# Patient Record
Sex: Female | Born: 1965 | Race: Black or African American | Hispanic: No | Marital: Married | Smoking: Current some day smoker
Health system: Southern US, Community
[De-identification: ages and names within clinical notes are randomized; demographics above are authoritative.]

## PROBLEM LIST (undated history)

## (undated) DIAGNOSIS — F32A Depression, unspecified: Secondary | ICD-10-CM

## (undated) DIAGNOSIS — M1710 Unilateral primary osteoarthritis, unspecified knee: Secondary | ICD-10-CM

## (undated) DIAGNOSIS — F329 Major depressive disorder, single episode, unspecified: Secondary | ICD-10-CM

## (undated) DIAGNOSIS — M1990 Unspecified osteoarthritis, unspecified site: Secondary | ICD-10-CM

## (undated) DIAGNOSIS — B019 Varicella without complication: Secondary | ICD-10-CM

## (undated) DIAGNOSIS — E669 Obesity, unspecified: Secondary | ICD-10-CM

## (undated) DIAGNOSIS — Z87828 Personal history of other (healed) physical injury and trauma: Secondary | ICD-10-CM

## (undated) DIAGNOSIS — E611 Iron deficiency: Secondary | ICD-10-CM

## (undated) DIAGNOSIS — M179 Osteoarthritis of knee, unspecified: Secondary | ICD-10-CM

## (undated) HISTORY — DX: Unspecified osteoarthritis, unspecified site: M19.90

## (undated) HISTORY — DX: Depression, unspecified: F32.A

## (undated) HISTORY — DX: Major depressive disorder, single episode, unspecified: F32.9

## (undated) HISTORY — PX: COLONOSCOPY: SHX174

## (undated) HISTORY — PX: ABDOMINAL HYSTERECTOMY: SHX81

## (undated) HISTORY — DX: Varicella without complication: B01.9

---

## 2010-01-30 ENCOUNTER — Ambulatory Visit: Payer: Self-pay | Admitting: Gynecology

## 2010-01-30 ENCOUNTER — Other Ambulatory Visit: Admission: RE | Admit: 2010-01-30 | Discharge: 2010-01-30 | Payer: Self-pay | Admitting: Gynecology

## 2010-02-04 ENCOUNTER — Ambulatory Visit: Payer: Self-pay | Admitting: Gynecology

## 2010-02-16 ENCOUNTER — Ambulatory Visit: Payer: Self-pay | Admitting: Gynecology

## 2010-08-14 ENCOUNTER — Ambulatory Visit: Admit: 2010-08-14 | Payer: Self-pay | Admitting: Family Medicine

## 2010-08-18 ENCOUNTER — Ambulatory Visit: Admit: 2010-08-18 | Payer: Self-pay | Admitting: Family Medicine

## 2011-03-04 ENCOUNTER — Other Ambulatory Visit: Payer: Self-pay | Admitting: Family Medicine

## 2011-03-04 DIAGNOSIS — I8390 Asymptomatic varicose veins of unspecified lower extremity: Secondary | ICD-10-CM

## 2011-03-08 ENCOUNTER — Ambulatory Visit
Admission: RE | Admit: 2011-03-08 | Discharge: 2011-03-08 | Disposition: A | Discharge status: Home / Self Care | Payer: Managed Care, Other (non HMO) | Source: Ambulatory Visit | Attending: Family Medicine | Admitting: Family Medicine

## 2011-03-08 DIAGNOSIS — I8390 Asymptomatic varicose veins of unspecified lower extremity: Secondary | ICD-10-CM

## 2011-06-10 ENCOUNTER — Other Ambulatory Visit: Payer: Self-pay | Admitting: Obstetrics and Gynecology

## 2011-06-10 DIAGNOSIS — N644 Mastodynia: Secondary | ICD-10-CM

## 2011-06-10 DIAGNOSIS — N6452 Nipple discharge: Secondary | ICD-10-CM

## 2011-07-06 ENCOUNTER — Ambulatory Visit: Payer: Managed Care, Other (non HMO)

## 2012-11-20 ENCOUNTER — Encounter (HOSPITAL_COMMUNITY): Payer: Self-pay | Admitting: *Deleted

## 2012-11-20 ENCOUNTER — Emergency Department (HOSPITAL_COMMUNITY): Payer: Managed Care, Other (non HMO)

## 2012-11-20 ENCOUNTER — Other Ambulatory Visit: Payer: Self-pay

## 2012-11-20 ENCOUNTER — Emergency Department (HOSPITAL_COMMUNITY)
Admission: EM | Admit: 2012-11-20 | Discharge: 2012-11-20 | Disposition: A | Discharge status: Home / Self Care | Payer: Managed Care, Other (non HMO) | Attending: Emergency Medicine | Admitting: Emergency Medicine

## 2012-11-20 DIAGNOSIS — R071 Chest pain on breathing: Secondary | ICD-10-CM | POA: Insufficient documentation

## 2012-11-20 DIAGNOSIS — J329 Chronic sinusitis, unspecified: Secondary | ICD-10-CM | POA: Insufficient documentation

## 2012-11-20 DIAGNOSIS — R61 Generalized hyperhidrosis: Secondary | ICD-10-CM | POA: Insufficient documentation

## 2012-11-20 DIAGNOSIS — J3489 Other specified disorders of nose and nasal sinuses: Secondary | ICD-10-CM | POA: Insufficient documentation

## 2012-11-20 DIAGNOSIS — Z880 Allergy status to penicillin: Secondary | ICD-10-CM | POA: Insufficient documentation

## 2012-11-20 DIAGNOSIS — R059 Cough, unspecified: Secondary | ICD-10-CM | POA: Insufficient documentation

## 2012-11-20 DIAGNOSIS — R05 Cough: Secondary | ICD-10-CM | POA: Insufficient documentation

## 2012-11-20 DIAGNOSIS — Z87891 Personal history of nicotine dependence: Secondary | ICD-10-CM | POA: Insufficient documentation

## 2012-11-20 DIAGNOSIS — R0789 Other chest pain: Secondary | ICD-10-CM

## 2012-11-20 LAB — BASIC METABOLIC PANEL
BUN: 17 mg/dL (ref 6–23)
Chloride: 105 mEq/L (ref 96–112)
Creatinine, Ser: 0.96 mg/dL (ref 0.50–1.10)
GFR calc Af Amer: 80 mL/min — ABNORMAL LOW (ref >90)
GFR calc non Af Amer: 69 mL/min — ABNORMAL LOW (ref >90)

## 2012-11-20 LAB — CBC
MCHC: 33.6 g/dL (ref 30.0–36.0)
MCV: 82.4 fL (ref 78.0–100.0)
Platelets: 384 10*3/uL (ref 150–400)
RDW: 14 % (ref 11.5–15.5)
WBC: 9.9 10*3/uL (ref 4.0–10.5)

## 2012-11-20 LAB — POCT I-STAT TROPONIN I: Troponin i, poc: 0 ng/mL (ref 0.00–0.08)

## 2012-11-20 MED ORDER — BENZONATATE 100 MG PO CAPS: 100.0000 mg | ORAL_CAPSULE | Freq: Three times a day (TID) | ORAL | Status: DC

## 2012-11-20 MED ORDER — OXYMETAZOLINE HCL 0.05 % NA SOLN: 2.0000 | Freq: Two times a day (BID) | NASAL | Status: DC

## 2012-11-20 MED ORDER — AZITHROMYCIN 250 MG PO TABS: ORAL_TABLET | ORAL | Status: DC

## 2012-11-20 MED ORDER — IBUPROFEN 600 MG PO TABS: 600.0000 mg | ORAL_TABLET | Freq: Four times a day (QID) | ORAL | Status: DC | PRN

## 2012-11-20 MED ORDER — MOMETASONE FUROATE 50 MCG/ACT NA SUSP: 2.0000 | Freq: Every day | NASAL | Status: DC

## 2012-11-20 NOTE — ED Notes (Signed)
Pt states sneezing on Friday and then started having tightness in chest that increases with coughing.  Pt in no respiratory distress.  Pt states cough with productive sputum last week.

## 2012-11-20 NOTE — ED Provider Notes (Signed)
History     CSN: [id number]  Arrival date & time 11/20/12  0700   First MD Initiated Contact with Patient 11/20/12 [phone]      Chief Complaint  Patient presents with  . Chest Pain    (Consider location/radiation/quality/duration/timing/severity/associated sxs/prior treatment) HPI Pt p/w several weeks of sinus congestion and cough. NO fever or chills. She began having parasternal chest wall tenderness 3 days ago, worse with palpation and deep breathing. C/o diffuse swelling. No wheezing.  History reviewed. No pertinent past medical history.  Past Surgical History  Procedure Laterality Date  . Abdominal hysterectomy      partial    No family history on file.  History  Substance Use Topics  . Smoking status: Former Games developer  . Smokeless tobacco: Not on file  . Alcohol Use: Not on file     Comment: occ    OB History   Grav Para Term Preterm Abortions TAB SAB Ect Mult Living                  Review of Systems  Constitutional: Positive for diaphoresis. Negative for fever and chills.  HENT: Positive for congestion, rhinorrhea and sinus pressure. Negative for sore throat and neck stiffness.   Respiratory: Positive for cough. Negative for shortness of breath and wheezing.   Cardiovascular: Positive for chest pain. Negative for palpitations and leg swelling.  Gastrointestinal: Negative for nausea, vomiting and abdominal pain.  Musculoskeletal: Negative for myalgias and back pain.  Skin: Negative for rash and wound.  Neurological: Negative for dizziness, weakness, light-headedness and numbness.  All other systems reviewed and are negative.    Allergies  Penicillins  Home Medications   Current Outpatient Rx  Name  Route  Sig  Dispense  Refill  . acetaminophen (TYLENOL) 500 MG tablet   Oral   Take 1,000 mg by mouth every 6 (six) hours as needed for pain.         Marland Kitchen HYDROcodone-acetaminophen (NORCO/VICODIN) 5-325 MG per tablet   Oral   Take 1 tablet by mouth every 6  (six) hours as needed for pain.         Marland Kitchen azithromycin (ZITHROMAX Z-PAK) 250 MG tablet      2 po day one, then 1 daily x 4 days   5 tablet   0   . benzonatate (TESSALON) 100 MG capsule   Oral   Take 1 capsule (100 mg total) by mouth every 8 (eight) hours.   21 capsule   0   . ibuprofen (ADVIL,MOTRIN) 600 MG tablet   Oral   Take 1 tablet (600 mg total) by mouth every 6 (six) hours as needed for pain.   30 tablet   0   . mometasone (NASONEX) 50 MCG/ACT nasal spray   Nasal   Place 2 sprays into the nose daily.   17 g   12   . oxymetazoline (AFRIN NASAL SPRAY) 0.05 % nasal spray   Nasal   Place 2 sprays into the nose 2 (two) times daily.   30 mL   0     BP 106/69  Pulse 64  Temp(Src) 98.4 F (36.9 C) (Oral)  Resp 20  SpO2 100%  Physical Exam  Nursing note and vitals reviewed. Constitutional: She is oriented to person, place, and time. She appears well-developed and well-nourished. No distress.  HENT:  Head: Normocephalic and atraumatic.  Mouth/Throat: Oropharynx is clear and moist.  Left frontal sinus TTP. +nasal congestion  Eyes: EOM are normal.  Pupils are equal, round, and reactive to light.  Neck: Normal range of motion. Neck supple.  Cardiovascular: Normal rate and regular rhythm.   Pulmonary/Chest: Effort normal and breath sounds normal. No respiratory distress. She has no wheezes. She has no rales. She exhibits tenderness (reproduced chest wall pain with palp over costal cartilage).  Delayed exp phase  Abdominal: Soft. Bowel sounds are normal. She exhibits no mass. There is no tenderness. There is no rebound and no guarding.  Musculoskeletal: Normal range of motion. She exhibits no edema and no tenderness.  No calf swelling or tenderness  Neurological: She is alert and oriented to person, place, and time.  Skin: Skin is warm and dry. No rash noted. No erythema.  Psychiatric: She has a normal mood and affect. Her behavior is normal.    ED Course   Procedures (including critical care time)  Labs Reviewed  CBC - Abnormal; Notable for the following:    Hemoglobin 11.8 (*)    HCT 35.1 (*)    All other components within normal limits  BASIC METABOLIC PANEL - Abnormal; Notable for the following:    Glucose, Bld 115 (*)    GFR calc non Af Amer 69 (*)    GFR calc Af Amer 80 (*)    All other components within normal limits   Dg Chest 2 View  11/20/2012  *RADIOLOGY REPORT*  Clinical Data: Chest pain and cough.  CHEST - 2 VIEW  Comparison: None.  Findings: Two views of the chest were obtained.  The lungs are clear without airspace disease or pulmonary edema. Heart and mediastinum are within normal limits.  The trachea is midline.  The bony thorax is intact.  IMPRESSION: No acute cardiopulmonary disease.   Original Report Authenticated By: Richarda Overlie, M.D.      1. Sinusitis   2. Chest wall pain       Date: 11/20/2012  Rate: 69  Rhythm: normal sinus rhythm  QRS Axis: normal  Intervals: normal  ST/T Wave abnormalities: nonspecific T wave changes  Conduction Disutrbances:none  Narrative Interpretation:   Old EKG Reviewed: none available   MDM  Suspect bronchitis with chest wall strain vs costochondritis. Doubt ACS or PE.         Loren Racer, MD 11/20/12 [phone]

## 2013-06-26 ENCOUNTER — Emergency Department (HOSPITAL_BASED_OUTPATIENT_CLINIC_OR_DEPARTMENT_OTHER)
Admission: EM | Admit: 2013-06-26 | Discharge: 2013-06-26 | Disposition: A | Discharge status: Home / Self Care | Payer: Managed Care, Other (non HMO) | Attending: Emergency Medicine | Admitting: Emergency Medicine

## 2013-06-26 ENCOUNTER — Encounter (HOSPITAL_BASED_OUTPATIENT_CLINIC_OR_DEPARTMENT_OTHER): Payer: Self-pay | Admitting: Emergency Medicine

## 2013-06-26 ENCOUNTER — Emergency Department (HOSPITAL_BASED_OUTPATIENT_CLINIC_OR_DEPARTMENT_OTHER): Payer: Managed Care, Other (non HMO)

## 2013-06-26 DIAGNOSIS — IMO0002 Reserved for concepts with insufficient information to code with codable children: Secondary | ICD-10-CM | POA: Insufficient documentation

## 2013-06-26 DIAGNOSIS — M169 Osteoarthritis of hip, unspecified: Secondary | ICD-10-CM

## 2013-06-26 DIAGNOSIS — M545 Low back pain, unspecified: Secondary | ICD-10-CM | POA: Insufficient documentation

## 2013-06-26 DIAGNOSIS — F17200 Nicotine dependence, unspecified, uncomplicated: Secondary | ICD-10-CM | POA: Insufficient documentation

## 2013-06-26 DIAGNOSIS — Z792 Long term (current) use of antibiotics: Secondary | ICD-10-CM | POA: Insufficient documentation

## 2013-06-26 DIAGNOSIS — M1610 Unilateral primary osteoarthritis, unspecified hip: Secondary | ICD-10-CM | POA: Insufficient documentation

## 2013-06-26 DIAGNOSIS — Z880 Allergy status to penicillin: Secondary | ICD-10-CM | POA: Insufficient documentation

## 2013-06-26 DIAGNOSIS — Z79899 Other long term (current) drug therapy: Secondary | ICD-10-CM | POA: Insufficient documentation

## 2013-06-26 LAB — URINALYSIS, ROUTINE W REFLEX MICROSCOPIC
Bilirubin Urine: NEGATIVE
Glucose, UA: NEGATIVE mg/dL
Ketones, ur: NEGATIVE mg/dL
Protein, ur: NEGATIVE mg/dL
Urobilinogen, UA: 0.2 mg/dL (ref 0.0–1.0)

## 2013-06-26 LAB — URINE MICROSCOPIC-ADD ON

## 2013-06-26 MED ORDER — IBUPROFEN 800 MG PO TABS: 800.0000 mg | ORAL_TABLET | Freq: Three times a day (TID) | ORAL | Status: DC

## 2013-06-26 MED ORDER — IBUPROFEN 800 MG PO TABS
800.0000 mg | ORAL_TABLET | Freq: Once | ORAL | Status: AC
  Administered 2013-06-26: 800 mg via ORAL
  Filled 2013-06-26: qty 1

## 2013-06-26 NOTE — ED Notes (Signed)
Patient transported to X-ray 

## 2013-06-26 NOTE — ED Notes (Signed)
MD at bedside giving test results and plan of care for DC. 

## 2013-06-26 NOTE — ED Notes (Signed)
Reports pain and swelling to left flank x 2 weeks- had been using OTC meds with some relief but no longer working. Denies known injury

## 2013-06-26 NOTE — ED Provider Notes (Signed)
CSN: [id number]     Arrival date & time 06/26/13  0051 History   First MD Initiated Contact with Patient 06/26/13 0106     Chief Complaint  Patient presents with  . Flank Pain   (Consider location/radiation/quality/duration/timing/severity/associated sxs/prior Treatment) HPI 47 year old female who comes in today complaining of some left low back pain that radiates to her buttocks that has been present intermittently for a week. She states it is worse when she is up and walking around. She has been taking ibuprofen with relief. She went to work tonight did not have pain but then began having pain. She did not have any medication at work to treat it and came here subsequently for evaluation. She has not had any known trauma to the area had any weakness or numbness of her leg. She has pain with movement of the hip and especially with putting weight on that area. She has no history of immune disorder. History reviewed. No pertinent past medical history. Past Surgical History  Procedure Laterality Date  . Abdominal hysterectomy      partial   No family history on file. History  Substance Use Topics  . Smoking status: Current Every Day Smoker    Types: Cigarettes  . Smokeless tobacco: Never Used     Comment: electronic  . Alcohol Use: Yes     Comment: RARE   OB History   Grav Para Term Preterm Abortions TAB SAB Ect Mult Living                 Review of Systems  All other systems reviewed and are negative.    Allergies  Penicillins  Home Medications   Current Outpatient Rx  Name  Route  Sig  Dispense  Refill  . ibuprofen (ADVIL,MOTRIN) 600 MG tablet   Oral   Take 1 tablet (600 mg total) by mouth every 6 (six) hours as needed for pain.   30 tablet   0   . mometasone (NASONEX) 50 MCG/ACT nasal spray   Nasal   Place 2 sprays into the nose daily.   17 g   12   . acetaminophen (TYLENOL) 500 MG tablet   Oral   Take 1,000 mg by mouth every 6 (six) hours as needed for  pain.         Marland Kitchen azithromycin (ZITHROMAX Z-PAK) 250 MG tablet      2 po day one, then 1 daily x 4 days   5 tablet   0   . benzonatate (TESSALON) 100 MG capsule   Oral   Take 1 capsule (100 mg total) by mouth every 8 (eight) hours.   21 capsule   0   . HYDROcodone-acetaminophen (NORCO/VICODIN) 5-325 MG per tablet   Oral   Take 1 tablet by mouth every 6 (six) hours as needed for pain.         Marland Kitchen oxymetazoline (AFRIN NASAL SPRAY) 0.05 % nasal spray   Nasal   Place 2 sprays into the nose 2 (two) times daily.   30 mL   0    BP 122/80  Pulse 70  Temp(Src) 98.6 F (37 C) (Oral)  Resp 20  Ht 5\' 6"  (1.676 m)  Wt 208 lb (94.348 kg)  BMI 33.59 kg/m2  SpO2 100% Physical Exam  Nursing note and vitals reviewed. Constitutional: She is oriented to person, place, and time. She appears well-developed and well-nourished.  HENT:  Head: Normocephalic and atraumatic.  Right Ear: External ear normal.  Left  Ear: External ear normal.  Nose: Nose normal.  Mouth/Throat: Oropharynx is clear and moist.  Eyes: Conjunctivae and EOM are normal. Pupils are equal, round, and reactive to light.  Neck: Normal range of motion. Neck supple.  Cardiovascular: Normal rate, regular rhythm, normal heart sounds and intact distal pulses.   Pulmonary/Chest: Effort normal and breath sounds normal.  Abdominal: Soft. Bowel sounds are normal.  Musculoskeletal: Normal range of motion.  Some tenderness to palpation laterally over her left hip. Full active range of motion of hip, knee, ankle, and lumbar spine.  Neurological: She is alert and oriented to person, place, and time. She has normal reflexes.  Skin: Skin is warm and dry.  Psychiatric: She has a normal mood and affect. Her behavior is normal. Thought content normal.    ED Course  Procedures (including critical care time) Labs Review Labs Reviewed  URINALYSIS, ROUTINE W REFLEX MICROSCOPIC - Abnormal; Notable for the following:    Hgb urine dipstick  MODERATE (*)    All other components within normal limits  URINE MICROSCOPIC-ADD ON   Imaging Review Dg Hip Complete Left  06/26/2013   CLINICAL DATA:  Left hip pain at iliac crest.  EXAM: LEFT HIP - COMPLETE 2+ VIEW  COMPARISON:  None available for comparison at time of study interpretation.  FINDINGS: There is no evidence of hip fracture or dislocation. Minimal superolateral acetabular spurring. There is no evidence of arthropathy or other focal bone abnormality. Phleboliths and dropped clip in the right pelvis.  IMPRESSION: No acute fracture deformity nor dislocation. Early left hip osteoarthrosis.   Electronically Signed   By: Awilda Metro   On: 06/26/2013 02:04    EKG Interpretation   None       MDM  Patient with left low back, buttock, and hip pain. She has DJD on her left hip x-Cecilio Ohlrich which would be consistent with the symptoms she is having. She does not have any evidence of having any acute infection of the joint with no history of diabetes, no redness, no fever, and no severe tenderness to palpation at the hip joint. The joint is able to be moved actively. She is advised if she has any symptoms consistent with this she should re\re term for reevaluation. She is placed on nonsteroidals. She is advised to followup with her primary care physician and is given a note for tonight.    Hilario Quarry, MD 06/26/13 [phone]

## 2013-06-28 DIAGNOSIS — Z90710 Acquired absence of both cervix and uterus: Secondary | ICD-10-CM | POA: Insufficient documentation

## 2013-06-28 DIAGNOSIS — Z3202 Encounter for pregnancy test, result negative: Secondary | ICD-10-CM | POA: Insufficient documentation

## 2013-06-28 DIAGNOSIS — F17200 Nicotine dependence, unspecified, uncomplicated: Secondary | ICD-10-CM | POA: Insufficient documentation

## 2013-06-28 DIAGNOSIS — Z791 Long term (current) use of non-steroidal anti-inflammatories (NSAID): Secondary | ICD-10-CM | POA: Insufficient documentation

## 2013-06-28 DIAGNOSIS — M1990 Unspecified osteoarthritis, unspecified site: Secondary | ICD-10-CM | POA: Insufficient documentation

## 2013-06-28 DIAGNOSIS — Z880 Allergy status to penicillin: Secondary | ICD-10-CM | POA: Insufficient documentation

## 2013-06-28 NOTE — ED Notes (Signed)
Pt. reports left lower flank pain for 2 weeks denies injury , no dysuria or hematuria , pt. stated she was diagnosed with DJD last Dec. 2 at [facility] prescribed with Ibuprofen with no relief.

## 2013-06-29 ENCOUNTER — Emergency Department (HOSPITAL_COMMUNITY)
Admission: EM | Admit: 2013-06-29 | Discharge: 2013-06-29 | Disposition: A | Discharge status: Home / Self Care | Payer: Managed Care, Other (non HMO) | Attending: Emergency Medicine | Admitting: Emergency Medicine

## 2013-06-29 ENCOUNTER — Encounter (HOSPITAL_COMMUNITY): Payer: Self-pay | Admitting: Emergency Medicine

## 2013-06-29 ENCOUNTER — Emergency Department (HOSPITAL_COMMUNITY): Payer: Managed Care, Other (non HMO)

## 2013-06-29 DIAGNOSIS — IMO0002 Reserved for concepts with insufficient information to code with codable children: Secondary | ICD-10-CM

## 2013-06-29 LAB — URINALYSIS, ROUTINE W REFLEX MICROSCOPIC
Glucose, UA: NEGATIVE mg/dL
Ketones, ur: NEGATIVE mg/dL
Leukocytes, UA: NEGATIVE
Nitrite: NEGATIVE
Specific Gravity, Urine: 1.007 (ref 1.005–1.030)
pH: 5.5 (ref 5.0–8.0)

## 2013-06-29 LAB — URINE MICROSCOPIC-ADD ON

## 2013-06-29 LAB — POCT PREGNANCY, URINE: Preg Test, Ur: NEGATIVE

## 2013-06-29 MED ORDER — METHOCARBAMOL 750 MG PO TABS: 750.0000 mg | ORAL_TABLET | Freq: Four times a day (QID) | ORAL | Status: DC

## 2013-06-29 MED ORDER — HYDROMORPHONE HCL PF 1 MG/ML IJ SOLN
1.0000 mg | Freq: Once | INTRAMUSCULAR | Status: AC
  Administered 2013-06-29: 1 mg via INTRAMUSCULAR
  Filled 2013-06-29: qty 1

## 2013-06-29 MED ORDER — OXYCODONE-ACETAMINOPHEN 5-325 MG PO TABS: 2.0000 | ORAL_TABLET | ORAL | Status: DC | PRN

## 2013-06-29 MED ORDER — PREDNISONE 10 MG PO TABS: 20.0000 mg | ORAL_TABLET | Freq: Every day | ORAL | Status: DC

## 2013-06-29 NOTE — ED Notes (Signed)
Pt st's she has been having pain in left flank x's 2 weeks.  Pt denies any injury.  Pt alert and oriented x's 3, skin warm and dry color appropriate.

## 2013-06-29 NOTE — ED Notes (Signed)
Pt to CT at this time.

## 2013-06-29 NOTE — ED Provider Notes (Signed)
CSN: [id number]     Arrival date & time 06/28/13  2331 History   First MD Initiated Contact with Patient 06/29/13 0021     Chief Complaint  Patient presents with  . Flank Pain   (Consider location/radiation/quality/duration/timing/severity/associated sxs/prior Treatment) Patient is a 47 y.o. female presenting with flank pain. The history is provided by the patient.  Flank Pain   patient here with 2 weeks of intermittent left-sided flank pain. Seen at facility recently diagnosed with left upper arthritis. She noted that there was some blood in her urine at that time. She denies any dysuria. No fever or chills. No rashes noted. Has been using Motrin without relief. Denies any radiation down her leg. This is not worse with standing.  History reviewed. No pertinent past medical history. Past Surgical History  Procedure Laterality Date  . Abdominal hysterectomy      partial   History reviewed. No pertinent family history. History  Substance Use Topics  . Smoking status: Current Every Day Smoker    Types: Cigarettes  . Smokeless tobacco: Never Used     Comment: electronic  . Alcohol Use: Yes     Comment: RARE   OB History   Grav Para Term Preterm Abortions TAB SAB Ect Mult Living                 Review of Systems  Genitourinary: Positive for flank pain.  All other systems reviewed and are negative.    Allergies  Penicillins  Home Medications   Current Outpatient Rx  Name  Route  Sig  Dispense  Refill  . ibuprofen (ADVIL,MOTRIN) 800 MG tablet   Oral   Take 1 tablet (800 mg total) by mouth 3 (three) times daily.   21 tablet   0    BP 124/81  Pulse 60  Temp(Src) 97.7 F (36.5 C) (Oral)  Resp 16  SpO2 100% Physical Exam  Nursing note and vitals reviewed. Constitutional: She is oriented to person, place, and time. She appears well-developed and well-nourished.  Non-toxic appearance. No distress.  HENT:  Head: Normocephalic and atraumatic.  Eyes: Conjunctivae, EOM  and lids are normal. Pupils are equal, round, and reactive to light.  Neck: Normal range of motion. Neck supple. No tracheal deviation present. No mass present.  Cardiovascular: Normal rate, regular rhythm and normal heart sounds.  Exam reveals no gallop.   No murmur heard. Pulmonary/Chest: Effort normal and breath sounds normal. No stridor. No respiratory distress. She has no decreased breath sounds. She has no wheezes. She has no rhonchi. She has no rales.  Abdominal: Soft. Normal appearance and bowel sounds are normal. She exhibits no distension. There is no tenderness. There is CVA tenderness. There is no rigidity, no rebound and no guarding.  Musculoskeletal: Normal range of motion. She exhibits no edema and no tenderness.  Neurological: She is alert and oriented to person, place, and time. She has normal strength. No cranial nerve deficit or sensory deficit. GCS eye subscore is 4. GCS verbal subscore is 5. GCS motor subscore is 6.  Skin: Skin is warm and dry. No abrasion and no rash noted.  Psychiatric: She has a normal mood and affect. Her speech is normal and behavior is normal.    ED Course  Procedures (including critical care time) Labs Review Labs Reviewed  URINALYSIS, ROUTINE W REFLEX MICROSCOPIC   Imaging Review No results found.  EKG Interpretation   None       MDM  No diagnosis found. Patient  given pain meds here feels better. CT negative for kidney stone. Recurrent symptoms are likely from her DJD at T11, T12 She is due for discharge    Toy Baker, MD 06/29/13 0206

## 2013-07-03 ENCOUNTER — Encounter: Payer: Self-pay | Admitting: Family Medicine

## 2013-07-03 ENCOUNTER — Ambulatory Visit (INDEPENDENT_AMBULATORY_CARE_PROVIDER_SITE_OTHER): Payer: Managed Care, Other (non HMO) | Admitting: Family Medicine

## 2013-07-03 VITALS — BP 130/83 | HR 66 | Ht 65.0 in | Wt 203.0 lb

## 2013-07-03 DIAGNOSIS — IMO0002 Reserved for concepts with insufficient information to code with codable children: Secondary | ICD-10-CM

## 2013-07-03 DIAGNOSIS — M545 Low back pain: Secondary | ICD-10-CM

## 2013-07-03 DIAGNOSIS — M5416 Radiculopathy, lumbar region: Secondary | ICD-10-CM

## 2013-07-03 MED ORDER — PREDNISONE 10 MG PO TABS: ORAL_TABLET | ORAL | Status: DC

## 2013-07-03 MED ORDER — OXYCODONE-ACETAMINOPHEN 7.5-325 MG PO TABS: 1.0000 | ORAL_TABLET | Freq: Four times a day (QID) | ORAL | Status: DC | PRN

## 2013-07-03 MED ORDER — METHOCARBAMOL 750 MG PO TABS: 750.0000 mg | ORAL_TABLET | Freq: Three times a day (TID) | ORAL | Status: DC | PRN

## 2013-07-03 NOTE — Patient Instructions (Signed)
You have lumbar radiculopathy (a pinched nerve in your low back). Take prednisone as directed.  Day after finishing prednisone start aleve 2 tabs twice a day with food for pain and inflammation. Percocet as needed for severe pain (no driving on this medicine). Robaxin as needed for muscle spasms (no driving on this medicine if it makes you sleepy). Stay as active as possible. Physical therapy has been shown to be helpful as well - start this and home exercises. Strengthening of low back muscles, abdominal musculature are key for long term pain relief. If not improving, will consider further imaging (MRI). Follow up with me in 5 weeks. Out of work in the meantime.

## 2013-07-04 ENCOUNTER — Encounter: Payer: Self-pay | Admitting: Family Medicine

## 2013-07-04 DIAGNOSIS — M545 Low back pain, unspecified: Secondary | ICD-10-CM | POA: Insufficient documentation

## 2013-07-04 NOTE — Progress Notes (Signed)
Patient ID: Lauren Boyle, female   DOB: 03/12/1966, 47 y.o.   MRN: [id number]  PCP: Geryl Rankins, MD  Subjective:   HPI: Patient is a 47 y.o. female here for back/hip pain.  Patient reports a little more than 3 weeks ago she got up and felt the left side of her low back down to hip/buttock 'seize up' Has had pain here worse with movement ever since. No bowel/bladder dysfunction. Tried ibuprofen, robaxin, percocet, prednisone. Not much improvement with this. Had CT abd/pelvis negative for kidney stones. 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  Lumbar spine radiographs normal. Advised by ED pain was due to her hip and back.  History reviewed. No pertinent past medical history.  Current Outpatient Prescriptions on File Prior to Visit  Medication Sig Dispense Refill  . ibuprofen (ADVIL,MOTRIN) 800 MG tablet Take 1 tablet (800 mg total) by mouth 3 (three) times daily.  21 tablet  0   No current facility-administered medications on file prior to visit.    Past Surgical History  Procedure Laterality Date  . Abdominal hysterectomy      partial    Allergies  Allergen Reactions  . Penicillins Anaphylaxis and Itching    History   Social History  . Marital Status: Married    Spouse Name: N/A    Number of Children: N/A  . Years of Education: N/A   Occupational History  . Not on file.   Social History Main Topics  . Smoking status: Current Every Day Smoker    Types: Cigarettes  . Smokeless tobacco: Never Used     Comment: electronic  . Alcohol Use: Yes     Comment: RARE  . Drug Use: No  . Sexual Activity: Not on file   Other Topics Concern  . Not on file   Social History Narrative  . No narrative on file    Family History  Problem Relation Age of Onset  . Hyperlipidemia Mother   . Hypertension Mother   . Hypertension Father   . Hyperlipidemia Father   . Diabetes Father   . Heart attack Neg Hx   . Sudden death Neg Hx     BP 130/83  Pulse 66  Ht 5\' 5"   (1.651 m)  Wt 203 lb (92.08 kg)  BMI 33.78 kg/m2  Review of Systems: See HPI above.    Objective:  Physical Exam:  Gen: NAD  Back/L hip: No gross deformity, scoliosis. TTP L>R paraspinal lumbar region.  No midline or bony TTP.  Left buttock, trochanter tenderness also. FROM with pain on flexion and extension. Strength LEs 5/5 all muscle groups.   2+ MSRs in patellar and achilles tendons, equal bilaterally. Positive SLR on left, negative right. Sensation intact to light touch bilaterally. Negative logroll bilateral hips Negative fabers and piriformis stretches.    Assessment & Plan:  1. Lumbar radiculopathy - start longer course of prednisone.  Percocet, robaxin as needed.  Start physical therapy and home exercise program.  If after total of 6 weeks she has not improved would consider going ahead with MRI lumbar spine, possible ESIs depending on that result.

## 2013-07-04 NOTE — Assessment & Plan Note (Signed)
start longer course of prednisone.  Percocet, robaxin as needed.  Start physical therapy and home exercise program.  If after total of 6 weeks she has not improved would consider going ahead with MRI lumbar spine, possible ESIs depending on that result.

## 2013-07-06 ENCOUNTER — Telehealth: Payer: Self-pay | Admitting: Family Medicine

## 2013-07-06 NOTE — Telephone Encounter (Signed)
I suspect it's because of the oxycodone - can cause constipation leading to straining, blood in stool, urinary hesitancy.  Ask her to stop this for a couple days, drink plenty of fluids, pick up a stool softener over the counter (colace or miralax - a laxative) and take as directed.  If she starts to have incontinence (losing control of bowel, bladder function), numbness in genital region, or weakness in both legs, she should call 911 (these are unlikely).  If still having problems with this come Monday we can see her in the office.

## 2013-07-06 NOTE — Telephone Encounter (Signed)
Message copied by Lenda Kelp on Fri Jul 06, 2013 12:03 PM ------      Message from: Gennette Pac D      Created: Fri Jul 06, 2013 11:47 AM      Regarding: FW: questions      Contact: [phone]                   ----- Message -----         From: Chari Manning         Sent: 07/06/2013  11:20 AM           To: Rosaria Ferries, CMA      Subject: questions                                                Not sure if it is from meds but she is having trouble with bowel movements, hard to urinate and has blood in stool.  (would this be for you or Dr. Rexene Edison?)  She is taking Prednisone and Ocycodone, and muscle relaxer. ------

## 2013-07-09 ENCOUNTER — Encounter: Payer: Self-pay | Admitting: Family Medicine

## 2013-07-09 ENCOUNTER — Ambulatory Visit (INDEPENDENT_AMBULATORY_CARE_PROVIDER_SITE_OTHER): Payer: Managed Care, Other (non HMO) | Admitting: Family Medicine

## 2013-07-09 ENCOUNTER — Ambulatory Visit: Payer: Managed Care, Other (non HMO) | Attending: Family Medicine | Admitting: Physical Therapy

## 2013-07-09 VITALS — BP 118/78 | HR 68 | Ht 65.0 in | Wt 203.0 lb

## 2013-07-09 DIAGNOSIS — R3911 Hesitancy of micturition: Secondary | ICD-10-CM

## 2013-07-09 DIAGNOSIS — IMO0002 Reserved for concepts with insufficient information to code with codable children: Secondary | ICD-10-CM

## 2013-07-09 DIAGNOSIS — IMO0001 Reserved for inherently not codable concepts without codable children: Secondary | ICD-10-CM | POA: Insufficient documentation

## 2013-07-09 DIAGNOSIS — M5416 Radiculopathy, lumbar region: Secondary | ICD-10-CM

## 2013-07-09 DIAGNOSIS — M545 Low back pain, unspecified: Secondary | ICD-10-CM | POA: Insufficient documentation

## 2013-07-09 DIAGNOSIS — K921 Melena: Secondary | ICD-10-CM

## 2013-07-09 NOTE — Assessment & Plan Note (Signed)
At this point with her trouble urinating and with stools (though has not lost control of bowels), advised we move forward with MRI of her lumbar spine.  Cauda equina is a possibility though doubtful as she has full strength of legs, no saddle anesthesia, no bowel incontinence.  Advised if no evidence of this would consider referral to GI, urology for further evaluation.  Probable the oxycodone caused constipation and to a lesser extent her urinary problems.  Asked her about PCP - states she does not go to this physician any longer.

## 2013-07-09 NOTE — Progress Notes (Addendum)
Patient ID: Lauren Boyle, female   DOB: [birth year], 47 y.o.   MRN: [id number]  PCP: Geryl Rankins, MD  Subjective:   HPI: Patient is a 47 y.o. female here for back/hip pain.  12/9: Patient reports a little more than 3 weeks ago she got up and felt the left side of her low back down to hip/buttock 'seize up' Has had pain here worse with movement ever since. No bowel/bladder dysfunction. Tried ibuprofen, robaxin, percocet, prednisone. Not much improvement with this. Had CT abd/pelvis negative for kidney stones. 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  Lumbar spine radiographs normal. Advised by ED pain was due to her hip and back.  12/15: Patient returns noting despite stopping oxycodone on Thursday she has continued to have constipation and difficulty urinating (hesitancy). Has had to use a suppository to have a bowel movement. No saddle anesthesia. Still with left side of low back pain radiating to hip and buttock. Longer course of prednisone not helping. Thursday had blood in stool - Sunday as well. Left leg feels weak.  History reviewed. No pertinent past medical history.  Current Outpatient Prescriptions on File Prior to Visit  Medication Sig Dispense Refill  . ibuprofen (ADVIL,MOTRIN) 800 MG tablet Take 1 tablet (800 mg total) by mouth 3 (three) times daily.  21 tablet  0  . methocarbamol (ROBAXIN-750) 750 MG tablet Take 1 tablet (750 mg total) by mouth every 8 (eight) hours as needed for muscle spasms.  90 tablet  0  . oxyCODONE-acetaminophen (PERCOCET) 7.5-325 MG per tablet Take 1 tablet by mouth every 6 (six) hours as needed for pain.  60 tablet  0  . predniSONE (DELTASONE) 10 MG tablet 6 tabs po days 1-2, 5 tabs po days 3-4, 4 tabs po days 5-6, 3 tabs po days 7-8, 2 tabs po days 9-10, 1 tab po days 11-12  42 tablet  0   No current facility-administered medications on file prior to visit.    Past Surgical History  Procedure Laterality Date  . Abdominal hysterectomy       partial    Allergies  Allergen Reactions  . Penicillins Anaphylaxis and Itching    History   Social History  . Marital Status: Married    Spouse Name: N/A    Number of Children: N/A  . Years of Education: N/A   Occupational History  . Not on file.   Social History Main Topics  . Smoking status: Current Every Day Smoker    Types: Cigarettes  . Smokeless tobacco: Never Used     Comment: electronic  . Alcohol Use: Yes     Comment: RARE  . Drug Use: No  . Sexual Activity: Not on file   Other Topics Concern  . Not on file   Social History Narrative  . No narrative on file    Family History  Problem Relation Age of Onset  . Hyperlipidemia Mother   . Hypertension Mother   . Hypertension Father   . Hyperlipidemia Father   . Diabetes Father   . Heart attack Neg Hx   . Sudden death Neg Hx     BP 118/78  Pulse 68  Ht 5\' 5"  (1.651 m)  Wt 203 lb (92.08 kg)  BMI 33.78 kg/m2  Review of Systems: See HPI above.    Objective:  Physical Exam:  Gen: NAD  Back/L hip: No gross deformity, scoliosis. TTP L>R paraspinal lumbar region.  No midline or bony TTP.  Left buttock, trochanter tenderness also. FROM with  pain on flexion and extension. Strength LEs 5/5 all muscle groups.   2+ MSRs in patellar and achilles tendons, equal bilaterally. Positive SLR on left, negative right. Sensation intact to light touch bilaterally. Negative logroll bilateral hips    Assessment & Plan:  1. Lumbar radiculopathy - At this point with her trouble urinating and with stools (though has not lost control of bowels), advised we move forward with MRI of her lumbar spine.  Cauda equina is a possibility though doubtful as she has full strength of legs, no saddle anesthesia, no bowel incontinence.  Advised if no evidence of this would consider referral to GI, urology for further evaluation.  Probable the oxycodone caused constipation and to a lesser extent her urinary problems.  Asked her  about PCP - states she does not go to this physician any longer.    Addendum:  MRI reviewed and discussed with patient.  She does have a small disc bulge at L5-S1 but no nerve compression.  This still could be symptomatic but nothing to account for her bowel/bladder dysfunction.  Continue with PT.  Will refer to GI, urology as noted above.  FMLA paperwork filled out.

## 2013-07-11 NOTE — Addendum Note (Signed)
Addended by: Lenda Kelp on: 07/11/2013 05:04 PM   Modules accepted: Orders

## 2013-07-17 ENCOUNTER — Ambulatory Visit: Payer: Managed Care, Other (non HMO) | Admitting: Physical Therapy

## 2013-07-23 ENCOUNTER — Encounter: Payer: Self-pay | Admitting: Family Medicine

## 2013-07-23 ENCOUNTER — Ambulatory Visit: Payer: Managed Care, Other (non HMO) | Admitting: Family Medicine

## 2013-07-23 ENCOUNTER — Ambulatory Visit: Payer: Managed Care, Other (non HMO) | Admitting: Physical Therapy

## 2013-07-23 ENCOUNTER — Ambulatory Visit (INDEPENDENT_AMBULATORY_CARE_PROVIDER_SITE_OTHER): Payer: Managed Care, Other (non HMO) | Admitting: Family Medicine

## 2013-07-23 VITALS — BP 103/73 | HR 67 | Ht 65.0 in | Wt 203.0 lb

## 2013-07-23 DIAGNOSIS — M5416 Radiculopathy, lumbar region: Secondary | ICD-10-CM

## 2013-07-23 DIAGNOSIS — IMO0002 Reserved for concepts with insufficient information to code with codable children: Secondary | ICD-10-CM

## 2013-07-23 NOTE — Patient Instructions (Signed)
Continue with physical therapy as you have been. Do home exercises on days you do not go there. Make appointment with GI and complete the urology follow-up and testing. Stay as active as possible. No lifting more than 15 pounds.  No stooping, bending, crawling, kneeling.  If no job available must be out of work until follow-up in 1 month. Follow up with me in 1 month.

## 2013-07-24 ENCOUNTER — Ambulatory Visit: Payer: Managed Care, Other (non HMO) | Admitting: Physical Therapy

## 2013-07-24 ENCOUNTER — Encounter: Payer: Self-pay | Admitting: Family Medicine

## 2013-07-24 NOTE — Assessment & Plan Note (Signed)
consistent with lumbar strain.  MRI showed small bulge at L5-S1 but not compressive and no tearing/herniation.  After she completes GI and urology workups advised to restart physical therapy.  Believe she can now go back to work light duty with restrictions.  No lifting more than 15 pounds.  No stooping, bending, crawling, kneeling.  If no job available must be out of work until follow-up in 1 month.

## 2013-07-24 NOTE — Progress Notes (Signed)
Patient ID: Lauren Boyle, female   DOB: [birth year], 47 y.o.   MRN: [id number]  PCP: Geryl Rankins, MD  Subjective:   HPI: Patient is a 47 y.o. female here for back/hip pain.  12/9: Patient reports a little more than 3 weeks ago she got up and felt the left side of her low back down to hip/buttock 'seize up' Has had pain here worse with movement ever since. No bowel/bladder dysfunction. Tried ibuprofen, robaxin, percocet, prednisone. Not much improvement with this. Had CT abd/pelvis negative for kidney stones. 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  Lumbar spine radiographs normal. Advised by ED pain was due to her hip and back.  12/15: Patient returns noting despite stopping oxycodone on Thursday she has continued to have constipation and difficulty urinating (hesitancy). Has had to use a suppository to have a bowel movement. No saddle anesthesia. Still with left side of low back pain radiating to hip and buttock. Longer course of prednisone not helping. Thursday had blood in stool - Sunday as well. Left leg feels weak.  12/29: Patient returns reporting pain has improved since last visit. She has been doing physical therapy but not doing anything active until GI and urology visits completed. Had Urology visit today and additional testing to be done over next couple days. Constipation improved but still has this - only a spot or two of blood occasionally with BMs. Low back not hurting currently. Does have this with prolonged sitting and quick movements. Not taking any medications for pain now.  History reviewed. No pertinent past medical history.  Current Outpatient Prescriptions on File Prior to Visit  Medication Sig Dispense Refill  . ibuprofen (ADVIL,MOTRIN) 800 MG tablet Take 1 tablet (800 mg total) by mouth 3 (three) times daily.  21 tablet  0  . methocarbamol (ROBAXIN-750) 750 MG tablet Take 1 tablet (750 mg total) by mouth every 8 (eight) hours as needed for muscle  spasms.  90 tablet  0  . oxyCODONE-acetaminophen (PERCOCET) 7.5-325 MG per tablet Take 1 tablet by mouth every 6 (six) hours as needed for pain.  60 tablet  0  . predniSONE (DELTASONE) 10 MG tablet 6 tabs po days 1-2, 5 tabs po days 3-4, 4 tabs po days 5-6, 3 tabs po days 7-8, 2 tabs po days 9-10, 1 tab po days 11-12  42 tablet  0   No current facility-administered medications on file prior to visit.    Past Surgical History  Procedure Laterality Date  . Abdominal hysterectomy      partial    Allergies  Allergen Reactions  . Penicillins Anaphylaxis and Itching    History   Social History  . Marital Status: Married    Spouse Name: N/A    Number of Children: N/A  . Years of Education: N/A   Occupational History  . Not on file.   Social History Main Topics  . Smoking status: Current Every Day Smoker    Types: Cigarettes  . Smokeless tobacco: Never Used     Comment: electronic  . Alcohol Use: Yes     Comment: RARE  . Drug Use: No  . Sexual Activity: Not on file   Other Topics Concern  . Not on file   Social History Narrative  . No narrative on file    Family History  Problem Relation Age of Onset  . Hyperlipidemia Mother   . Hypertension Mother   . Hypertension Father   . Hyperlipidemia Father   . Diabetes Father   .  Heart attack Neg Hx   . Sudden death Neg Hx     BP 103/73  Pulse 67  Ht 5\' 5"  (1.651 m)  Wt 203 lb (92.08 kg)  BMI 33.78 kg/m2  Review of Systems: See HPI above.    Objective:  Physical Exam:  Gen: NAD  Back/L hip: No gross deformity, scoliosis. No TTP currently paraspinal lumbar region.  No midline or bony TTP.  FROM with pain on flexion and extension, improved though. Strength LEs 5/5 all muscle groups.   2+ MSRs in patellar and achilles tendons, equal bilaterally. Negative SLR on left, negative right. Sensation intact to light touch bilaterally. Negative logroll bilateral hips    Assessment & Plan:  1. Low back pain -  consistent with lumbar strain.  MRI showed small bulge at L5-S1 but not compressive and no tearing/herniation.  After she completes GI and urology workups advised to restart physical therapy.  Believe she can now go back to work light duty with restrictions.  No lifting more than 15 pounds.  No stooping, bending, crawling, kneeling.  If no job available must be out of work until follow-up in 1 month.

## 2013-08-01 ENCOUNTER — Ambulatory Visit: Payer: Managed Care, Other (non HMO) | Attending: Family Medicine | Admitting: Rehabilitation

## 2013-08-01 DIAGNOSIS — M545 Low back pain, unspecified: Secondary | ICD-10-CM | POA: Insufficient documentation

## 2013-08-01 DIAGNOSIS — IMO0001 Reserved for inherently not codable concepts without codable children: Secondary | ICD-10-CM | POA: Insufficient documentation

## 2013-08-02 ENCOUNTER — Other Ambulatory Visit: Payer: Self-pay | Admitting: Gastroenterology

## 2013-08-02 DIAGNOSIS — R1013 Epigastric pain: Secondary | ICD-10-CM

## 2013-08-02 DIAGNOSIS — R110 Nausea: Secondary | ICD-10-CM

## 2013-08-03 ENCOUNTER — Ambulatory Visit: Payer: Managed Care, Other (non HMO) | Admitting: Physical Therapy

## 2013-08-06 ENCOUNTER — Ambulatory Visit: Payer: Managed Care, Other (non HMO) | Admitting: Physical Therapy

## 2013-08-07 ENCOUNTER — Ambulatory Visit: Payer: Managed Care, Other (non HMO) | Admitting: Family Medicine

## 2013-08-09 ENCOUNTER — Ambulatory Visit: Payer: Managed Care, Other (non HMO) | Admitting: Physical Therapy

## 2013-08-13 ENCOUNTER — Ambulatory Visit: Payer: Managed Care, Other (non HMO) | Admitting: Physical Therapy

## 2013-08-14 ENCOUNTER — Ambulatory Visit (INDEPENDENT_AMBULATORY_CARE_PROVIDER_SITE_OTHER): Payer: Self-pay | Admitting: General Surgery

## 2013-08-16 ENCOUNTER — Ambulatory Visit: Payer: Managed Care, Other (non HMO) | Admitting: Physical Therapy

## 2013-08-17 ENCOUNTER — Encounter (HOSPITAL_COMMUNITY)
Admission: RE | Admit: 2013-08-17 | Discharge: 2013-08-17 | Disposition: A | Discharge status: Home / Self Care | Payer: Managed Care, Other (non HMO) | Source: Ambulatory Visit | Attending: Gastroenterology | Admitting: Gastroenterology

## 2013-08-17 ENCOUNTER — Ambulatory Visit (HOSPITAL_COMMUNITY)
Admission: RE | Admit: 2013-08-17 | Discharge: 2013-08-17 | Disposition: A | Discharge status: Home / Self Care | Payer: Managed Care, Other (non HMO) | Source: Ambulatory Visit | Attending: Gastroenterology | Admitting: Gastroenterology

## 2013-08-17 DIAGNOSIS — R110 Nausea: Secondary | ICD-10-CM

## 2013-08-17 DIAGNOSIS — R112 Nausea with vomiting, unspecified: Secondary | ICD-10-CM | POA: Insufficient documentation

## 2013-08-17 DIAGNOSIS — R1013 Epigastric pain: Secondary | ICD-10-CM | POA: Insufficient documentation

## 2013-08-17 MED ORDER — TECHNETIUM TC 99M MEBROFENIN IV KIT
5.0000 | PACK | Freq: Once | INTRAVENOUS | Status: AC | PRN
  Administered 2013-08-17: 5 via INTRAVENOUS

## 2013-08-20 ENCOUNTER — Ambulatory Visit: Payer: Managed Care, Other (non HMO) | Admitting: Physical Therapy

## 2013-08-23 ENCOUNTER — Encounter: Payer: Self-pay | Admitting: Family Medicine

## 2013-08-23 ENCOUNTER — Ambulatory Visit: Payer: Managed Care, Other (non HMO) | Admitting: Physical Therapy

## 2013-08-23 ENCOUNTER — Ambulatory Visit (INDEPENDENT_AMBULATORY_CARE_PROVIDER_SITE_OTHER): Payer: Managed Care, Other (non HMO) | Admitting: Family Medicine

## 2013-08-23 VITALS — BP 97/65 | HR 76 | Temp 97.5°F | Ht 65.0 in | Wt 203.0 lb

## 2013-08-23 DIAGNOSIS — M545 Low back pain, unspecified: Secondary | ICD-10-CM

## 2013-08-25 ENCOUNTER — Encounter: Payer: Self-pay | Admitting: Family Medicine

## 2013-08-25 NOTE — Assessment & Plan Note (Signed)
consistent with lumbar strain.  MRI showed small bulge at L5-S1 but not compressive and no tearing/herniation.  She does not want to restart physical therapy.  At this point will return patient over next several weeks to full duty by increasing work hours.  Nothing objective to warrant continued light duty/out of work status otherwise.

## 2013-08-25 NOTE — Progress Notes (Signed)
Patient ID: Lauren Boyle, female   DOB: [birth year], 48 y.o.   MRN: [id number]  PCP: Geryl Rankins, MD  Subjective:   HPI: Patient is a 48 y.o. female here for back/hip pain.  12/9: Patient reports a little more than 3 weeks ago she got up and felt the left side of her low back down to hip/buttock 'seize up' Has had pain here worse with movement ever since. No bowel/bladder dysfunction. Tried ibuprofen, robaxin, percocet, prednisone. Not much improvement with this. Had CT abd/pelvis negative for kidney stones. 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  Lumbar spine radiographs normal. Advised by ED pain was due to her hip and back.  12/15: Patient returns noting despite stopping oxycodone on Thursday she has continued to have constipation and difficulty urinating (hesitancy). Has had to use a suppository to have a bowel movement. No saddle anesthesia. Still with left side of low back pain radiating to hip and buttock. Longer course of prednisone not helping. Thursday had blood in stool - Sunday as well. Left leg feels weak.  12/29: Patient returns reporting pain has improved since last visit. She has been doing physical therapy but not doing anything active until GI and urology visits completed. Had Urology visit today and additional testing to be done over next couple days. Constipation improved but still has this - only a spot or two of blood occasionally with BMs. Low back not hurting currently. Does have this with prolonged sitting and quick movements. Not taking any medications for pain now.  1/29: Patient reports she has good and bad days. Taking aleve. Doing HEP and working out at gym. Not interested in restarting physical therapy - states it wasn't helping her that much. Seeing GI but per her reports she's seeing them now for upper GI issues, not for constipation. Has another appointment with urology in 2 weeks.  Nothing felt to be related to her back pain.  History  reviewed. No pertinent past medical history.  Current Outpatient Prescriptions on File Prior to Visit  Medication Sig Dispense Refill  . ibuprofen (ADVIL,MOTRIN) 800 MG tablet Take 1 tablet (800 mg total) by mouth 3 (three) times daily.  21 tablet  0  . methocarbamol (ROBAXIN-750) 750 MG tablet Take 1 tablet (750 mg total) by mouth every 8 (eight) hours as needed for muscle spasms.  90 tablet  0  . oxyCODONE-acetaminophen (PERCOCET) 7.5-325 MG per tablet Take 1 tablet by mouth every 6 (six) hours as needed for pain.  60 tablet  0  . predniSONE (DELTASONE) 10 MG tablet 6 tabs po days 1-2, 5 tabs po days 3-4, 4 tabs po days 5-6, 3 tabs po days 7-8, 2 tabs po days 9-10, 1 tab po days 11-12  42 tablet  0   No current facility-administered medications on file prior to visit.    Past Surgical History  Procedure Laterality Date  . Abdominal hysterectomy      partial    Allergies  Allergen Reactions  . Penicillins Anaphylaxis and Itching    History   Social History  . Marital Status: Married    Spouse Name: N/A    Number of Children: N/A  . Years of Education: N/A   Occupational History  . Not on file.   Social History Main Topics  . Smoking status: Current Every Day Smoker    Types: Cigarettes  . Smokeless tobacco: Never Used     Comment: electronic  . Alcohol Use: Yes     Comment: RARE  .  Drug Use: No  . Sexual Activity: Not on file   Other Topics Concern  . Not on file   Social History Narrative  . No narrative on file    Family History  Problem Relation Age of Onset  . Hyperlipidemia Mother   . Hypertension Mother   . Hypertension Father   . Hyperlipidemia Father   . Diabetes Father   . Heart attack Neg Hx   . Sudden death Neg Hx     BP 97/65  Pulse 76  Temp(Src) 97.5 F (36.4 C) (Oral)  Ht 5\' 5"  (1.651 m)  Wt 203 lb (92.08 kg)  BMI 33.78 kg/m2  Review of Systems: See HPI above.    Objective:  Physical Exam:  Gen: NAD  Back/L hip: No gross  deformity, scoliosis. No TTP currently paraspinal lumbar region.  No midline or bony TTP.  FROM with pain on flexion and extension, improved though. Strength LEs 5/5 all muscle groups.   2+ MSRs in patellar and achilles tendons, equal bilaterally. Negative SLR on left, negative right. Sensation intact to light touch bilaterally. Negative logroll bilateral hips    Assessment & Plan:  1. Low back pain - consistent with lumbar strain.  MRI showed small bulge at L5-S1 but not compressive and no tearing/herniation.  She does not want to restart physical therapy.  At this point will return patient over next several weeks to full duty by increasing work hours.  Nothing objective to warrant continued light duty/out of work status otherwise.

## 2013-09-03 ENCOUNTER — Telehealth: Payer: Self-pay | Admitting: Family Medicine

## 2013-09-03 NOTE — Telephone Encounter (Signed)
Patient called and stated that her work called her Friday and wants to know if she can start her 8 hour shift on 09-09-13 instead of 09-10-13. She needs a letter if that is possible.

## 2013-09-03 NOTE — Telephone Encounter (Signed)
I would recommend sticking with the program we outlined to give a full week between increase in her hours.  Thanks!

## 2013-09-04 ENCOUNTER — Encounter (INDEPENDENT_AMBULATORY_CARE_PROVIDER_SITE_OTHER): Payer: Self-pay

## 2013-09-04 ENCOUNTER — Ambulatory Visit (INDEPENDENT_AMBULATORY_CARE_PROVIDER_SITE_OTHER): Payer: Managed Care, Other (non HMO) | Admitting: General Surgery

## 2013-09-04 ENCOUNTER — Encounter (INDEPENDENT_AMBULATORY_CARE_PROVIDER_SITE_OTHER): Payer: Self-pay | Admitting: General Surgery

## 2013-09-04 VITALS — BP 100/70 | HR 64 | Temp 98.7°F | Resp 14 | Ht 65.0 in | Wt 214.8 lb

## 2013-09-04 DIAGNOSIS — M545 Low back pain, unspecified: Secondary | ICD-10-CM

## 2013-09-04 NOTE — Patient Instructions (Signed)
Return to your orthopeadic doctor

## 2013-09-04 NOTE — Progress Notes (Signed)
Patient ID: Lauren Boyle, female   DOB: 03/29/1966, 48 y.o.   MRN: [id number]  Chief Complaint  Patient presents with  . New Evaluation    eval Abnormal liver/lesions    HPI Lauren MorosStephanie Boyle is a 48 y.o. female.  We are asked to see the patient in consultation by Dr. Iona CoachMcDairmid to evaluate her for a liver lesion. The patient is a 48 year old black female who has been experiencing back pain that radiates around her left rib cage the last few months. The pain has been persistent and unrelenting. Lauren Boyle has also had some trouble swallowing and is scheduled for an endoscopy with Dr. Loreta AveMann on March 20. Lauren Boyle recently had a CT scan that showed no evidence of a liver lesion. It also showed a 1.6 cm benign-appearing adrenal adenoma  HPI  History reviewed. No pertinent past medical history.  Past Surgical History  Procedure Laterality Date  . Abdominal hysterectomy      partial    Family History  Problem Relation Age of Onset  . Hyperlipidemia Mother   . Hypertension Mother   . Hypertension Father   . Hyperlipidemia Father   . Diabetes Father   . Heart attack Neg Hx   . Sudden death Neg Hx   . Cancer Sister     ovarian    Social History History  Substance Use Topics  . Smoking status: Current Every Day Smoker    Types: Cigarettes  . Smokeless tobacco: Current User     Comment: electronic  . Alcohol Use: 1.2 oz/week    2 Glasses of wine per week     Comment: daily    Allergies  Allergen Reactions  . Penicillins Anaphylaxis and Itching    Current Outpatient Prescriptions  Medication Sig Dispense Refill  . ibuprofen (ADVIL,MOTRIN) 800 MG tablet Take 1 tablet (800 mg total) by mouth 3 (three) times daily.  21 tablet  0   No current facility-administered medications for this visit.    Review of Systems Review of Systems  Constitutional: Negative.   HENT: Negative.   Eyes: Negative.   Respiratory: Negative.   Cardiovascular: Negative.   Gastrointestinal:  Negative.   Endocrine: Negative.   Genitourinary: Negative.   Musculoskeletal: Positive for back pain.  Skin: Negative.   Allergic/Immunologic: Negative.   Neurological: Negative.   Hematological: Negative.   Psychiatric/Behavioral: Negative.     Blood pressure 100/70, pulse 64, temperature 98.7 F (37.1 C), temperature source Oral, resp. rate 14, height 5\' 5"  (1.651 m), weight 214 lb 12.8 oz (97.433 kg).  Physical Exam Physical Exam  Constitutional: Lauren Boyle is oriented to person, place, and time. Lauren Boyle appears well-developed and well-nourished.  HENT:  Head: Normocephalic and atraumatic.  Eyes: Conjunctivae and EOM are normal. Pupils are equal, round, and reactive to light.  Neck: Normal range of motion. Neck supple.  Cardiovascular: Normal rate, regular rhythm and normal heart sounds.   Pulmonary/Chest: Effort normal and breath sounds normal.  Lauren Boyle is tender over her left lower rib cage. There is no palpable mass in this location  Abdominal: Soft. Bowel sounds are normal. There is no tenderness.  Musculoskeletal: Normal range of motion.  Lymphadenopathy:    Lauren Boyle has no cervical adenopathy.  Neurological: Lauren Boyle is alert and oriented to person, place, and time.  Skin: Skin is warm and dry.  Psychiatric: Lauren Boyle has a normal mood and affect. Her behavior is normal.    Data Reviewed As above  Assessment    The patient has persistent back pain that  radiates around her left thoracic rib cage. Lauren Boyle is scheduled for a lumbar MRI study.     Plan    At this point there is no identifiable abnormality with her liver. In addition to the MRI of her lumbar spine it should also probably include her thoracic spine given that her pain level is above the umbilicus which would suggest a thoracic source. Lauren Boyle may return to see [place] on a when necessary basis        TOTH III,Rashi Giuliani S 09/04/2013, 9:34 AM

## 2013-09-06 ENCOUNTER — Emergency Department (HOSPITAL_COMMUNITY)
Admission: EM | Admit: 2013-09-06 | Discharge: 2013-09-06 | Discharge status: Against Medical Advice | Payer: Managed Care, Other (non HMO) | Attending: Emergency Medicine | Admitting: Emergency Medicine

## 2013-09-06 ENCOUNTER — Encounter (HOSPITAL_COMMUNITY): Payer: Self-pay | Admitting: Emergency Medicine

## 2013-09-06 ENCOUNTER — Emergency Department (INDEPENDENT_AMBULATORY_CARE_PROVIDER_SITE_OTHER)
Admission: EM | Admit: 2013-09-06 | Discharge: 2013-09-06 | Disposition: A | Discharge status: Home / Self Care | Payer: Managed Care, Other (non HMO) | Source: Home / Self Care | Attending: Emergency Medicine | Admitting: Emergency Medicine

## 2013-09-06 ENCOUNTER — Emergency Department (INDEPENDENT_AMBULATORY_CARE_PROVIDER_SITE_OTHER): Payer: Managed Care, Other (non HMO)

## 2013-09-06 DIAGNOSIS — M940 Chondrocostal junction syndrome [Tietze]: Secondary | ICD-10-CM

## 2013-09-06 DIAGNOSIS — F17200 Nicotine dependence, unspecified, uncomplicated: Secondary | ICD-10-CM | POA: Insufficient documentation

## 2013-09-06 DIAGNOSIS — R079 Chest pain, unspecified: Secondary | ICD-10-CM | POA: Insufficient documentation

## 2013-09-06 MED ORDER — HYDROCODONE-ACETAMINOPHEN 5-325 MG PO TABS
ORAL_TABLET | ORAL | Status: AC
  Filled 2013-09-06: qty 2

## 2013-09-06 MED ORDER — DICLOFENAC SODIUM 75 MG PO TBEC: 75.0000 mg | DELAYED_RELEASE_TABLET | Freq: Two times a day (BID) | ORAL | Status: DC

## 2013-09-06 MED ORDER — METHYLPREDNISOLONE ACETATE 80 MG/ML IJ SUSP
INTRAMUSCULAR | Status: AC
  Filled 2013-09-06: qty 1

## 2013-09-06 MED ORDER — METHYLPREDNISOLONE ACETATE 80 MG/ML IJ SUSP
80.0000 mg | Freq: Once | INTRAMUSCULAR | Status: AC
  Administered 2013-09-06: 80 mg via INTRAMUSCULAR

## 2013-09-06 MED ORDER — HYDROCODONE-ACETAMINOPHEN 5-325 MG PO TABS: ORAL_TABLET | ORAL | Status: DC

## 2013-09-06 MED ORDER — HYDROCODONE-ACETAMINOPHEN 5-325 MG PO TABS
2.0000 | ORAL_TABLET | Freq: Once | ORAL | Status: AC
  Administered 2013-09-06: 2 via ORAL

## 2013-09-06 NOTE — ED Provider Notes (Signed)
Chief Complaint   Chief Complaint  Patient presents with  . Cough    History of Present Illness    Lauren Boyle is a 48 year old female who has had a three-week history of pain in the left, lower rib cage area along the costal margin. It's been worse the past 2 days. There is tenderness to palpation and she thinks it's a little bit swollen. She denies any injury to the area. She has had a cough productive of some white sputum. She is a one pack-a-day smoker. She's had some nausea and vomiting after meals. She denies any fever, chills, nasal congestion, rhinorrhea, or sore throat. She's had no shortness of breath, wheezing, pleuritic chest pain, palpitations, dizziness, syncope, or abdominal pain. She has seen Dr. Loreta AveMann for GI issues. Her last visit she was prescribed a Z-Pak and an acid blocker. She also has been seeing a sports medicine and for lower back pain. She states this has cleared up completely.  Review of Systems    Other than noted above, the patient denies any of the following symptoms. Systemic:  No fever, chills, sweats, or fatigue. ENT:  No nasal congestion, rhinorrhea, or sore throat. Pulmonary:  No cough, wheezing, shortness of breath, sputum production, hemoptysis. Cardiac:  No palpitations, rapid heartbeat, dizziness, presyncope or syncope. GI:  No abdominal pain, heartburn, nausea, or vomiting. Ext:  No leg pain or swelling.  PMFSH    Past medical history, family history, social history, meds, and allergies were reviewed and updated as needed. She is allergic to penicillins.  Physical Exam     Vital signs:  BP 107/67  Pulse 69  Temp(Src) 97.9 F (36.6 C) (Oral)  Resp 12  SpO2 100% Gen:  Alert, oriented, in no distress, skin warm and dry. Eye:  PERRL, lids and conjunctivas normal.  Sclera non-icteric. ENT:  Mucous membranes moist, pharynx clear. Neck:  Supple, no adenopathy or tenderness.  No JVD. Lungs:  Clear to auscultation, no wheezes, rales or  rhonchi.  No respiratory distress. Heart:  Regular rhythm.  No gallops, murmers, clicks or rubs. Chest:  She has chest wall tenderness to palpation along the left costal margin, possibly some swelling was noted in this area. Abdomen:  Soft, nontender, no organomegaly or mass.  Bowel sounds normal.  No pulsatile abdominal mass or bruit. Ext:  No edema.  No calf tenderness and Homann's sign negative.  Pulses full and equal. Skin:  Warm and dry.  No rash.    Radiology     Dg Ribs Unilateral W/chest Left  09/06/2013   CLINICAL DATA:  Left anterior rib pain for 2 weeks, no known injury  EXAM: LEFT RIBS AND CHEST - 3+ VIEW  COMPARISON:  CT ABD/PELV WO CM dated 06/29/2013; DG CHEST 2 VIEW dated 11/20/2012  FINDINGS: Grossly unchanged borderline enlarged cardiac silhouette. Normal mediastinal contours.  Veiling opacities overlying the bilateral lower lungs is favored to represent overlying breast tissue. No focal airspace opacities. No pleural effusion or pneumothorax. No definite evidence of edema.  No acute osseus abnormalities. Specifically, no displaced left-sided rib fractures with special attention paid to the area demarcated by the radiopaque BB.  IMPRESSION: No acute cardiopulmonary disease. Specifically, no definite displaced rib fractures.   Electronically Signed   By: Simonne ComeJohn  Watts M.D.   On: 09/06/2013 16:22   I reviewed the images independently and personally and concur with the radiologist's findings.   Electrocardiogram     Date: 09/06/2013  Rate: 72  Rhythm: normal sinus rhythm  QRS Axis: normal  Intervals: normal  ST/T Wave abnormalities: normal  Conduction Disutrbances:none  Narrative Interpretation: The EKG was read as showing a septal infarct of age undetermined on the basis of a QS wave in leads V1 and V2, and poor R-wave progression. There was otherwise no evidence of ischemia.  Old EKG Reviewed: none available  Course in Urgent Care Center   She was given Norco 5/325 2 by  mouth for pain. She was also given Depo-Medrol 80 mg IM.  Assessment     The encounter diagnosis was Costochondritis.  This appears to be a clear-cut case of musculoskeletal chest pain. There's no evidence that this is due to any cardiac or pulmonary disease.  Plan     1.  Meds:  The following meds were prescribed:   Discharge Medication List as of 09/06/2013  4:36 PM    START taking these medications   Details  diclofenac (VOLTAREN) 75 MG EC tablet Take 1 tablet (75 mg total) by mouth 2 (two) times daily., Starting 09/06/2013, Until Discontinued, Normal    HYDROcodone-acetaminophen (NORCO/VICODIN) 5-325 MG per tablet 1 to 2 tabs every 4 to 6 hours as needed for pain., Print        2.  Patient Education/Counseling:  The patient was given appropriate handouts, self care instructions, and instructed in symptomatic relief.    3.  Follow up:  The patient was told to follow up here if no better in 3 to 4 days, or sooner if becoming worse in any way, and give an an some red flag symptoms such as worsening pain, shortness of breath, dizziness, or passing out which would prompt immediate return. Suggested she followup with her primary care physician in one week.     Reuben Likes, MD 09/06/13 [phone]

## 2013-09-06 NOTE — ED Notes (Signed)
Pt in c/o left rib pain that radiates down to lower back and into chest at times, has been seen multiple times for this and is being followed by her PMD for same, states they are releasing her back to work and her pain has not been managed yet so she decided to come back here, denies chest pain at this time, EKG completed in triage.

## 2013-09-06 NOTE — Discharge Instructions (Signed)

## 2013-09-06 NOTE — ED Notes (Signed)
Ptb reports  l  Rib  Pain    She  Reports  The  Symptoms  Started  About  3  Weeks  Ago   The  Symptoms  Worse  Over  Last  Few  Days   -  Pain is  Worse  When   She  Coughs    Or  Takes  Deep  Breath

## 2013-09-06 NOTE — ED Notes (Signed)
Pt at desk stating that she would like to leave due to wait time, pt encouraged to stay, triage process explained, delay explained, pt states she is going to leave and try urgent care.

## 2013-10-04 ENCOUNTER — Ambulatory Visit: Payer: Managed Care, Other (non HMO) | Admitting: Family Medicine

## 2014-02-01 ENCOUNTER — Encounter (HOSPITAL_COMMUNITY): Payer: Self-pay | Admitting: Emergency Medicine

## 2014-02-01 ENCOUNTER — Emergency Department (HOSPITAL_COMMUNITY)
Admission: EM | Admit: 2014-02-01 | Discharge: 2014-02-01 | Discharge status: Against Medical Advice | Payer: Managed Care, Other (non HMO) | Attending: Emergency Medicine | Admitting: Emergency Medicine

## 2014-02-01 DIAGNOSIS — R1031 Right lower quadrant pain: Secondary | ICD-10-CM | POA: Insufficient documentation

## 2014-02-01 DIAGNOSIS — F17200 Nicotine dependence, unspecified, uncomplicated: Secondary | ICD-10-CM | POA: Insufficient documentation

## 2014-02-01 LAB — COMPREHENSIVE METABOLIC PANEL
ALBUMIN: 3.5 g/dL (ref 3.5–5.2)
ALT: 14 U/L (ref 0–35)
AST: 14 U/L (ref 0–37)
Alkaline Phosphatase: 90 U/L (ref 39–117)
Anion gap: 16 — ABNORMAL HIGH (ref 5–15)
BUN: 12 mg/dL (ref 6–23)
CO2: 22 mEq/L (ref 19–32)
CREATININE: 0.87 mg/dL (ref 0.50–1.10)
Calcium: 9.6 mg/dL (ref 8.4–10.5)
Chloride: 104 mEq/L (ref 96–112)
GFR calc Af Amer: 89 mL/min — ABNORMAL LOW (ref >90)
GFR calc non Af Amer: 77 mL/min — ABNORMAL LOW (ref >90)
Glucose, Bld: 102 mg/dL — ABNORMAL HIGH (ref 70–99)
Potassium: 3.8 mEq/L (ref 3.7–5.3)
Sodium: 142 mEq/L (ref 137–147)
Total Bilirubin: 0.5 mg/dL (ref 0.3–1.2)
Total Protein: 7.5 g/dL (ref 6.0–8.3)

## 2014-02-01 LAB — CBC WITH DIFFERENTIAL/PLATELET
BASOS ABS: 0 10*3/uL (ref 0.0–0.1)
BASOS PCT: 0 % (ref 0–1)
EOS PCT: 5 % (ref 0–5)
Eosinophils Absolute: 0.5 10*3/uL (ref 0.0–0.7)
HEMATOCRIT: 36.7 % (ref 36.0–46.0)
Hemoglobin: 12.2 g/dL (ref 12.0–15.0)
Lymphocytes Relative: 35 % (ref 12–46)
Lymphs Abs: 3.3 10*3/uL (ref 0.7–4.0)
MCH: 28 pg (ref 26.0–34.0)
MCHC: 33.2 g/dL (ref 30.0–36.0)
MCV: 84.2 fL (ref 78.0–100.0)
Monocytes Absolute: 0.7 10*3/uL (ref 0.1–1.0)
Monocytes Relative: 8 % (ref 3–12)
NEUTROS ABS: 4.9 10*3/uL (ref 1.7–7.7)
Neutrophils Relative %: 52 % (ref 43–77)
Platelets: 413 10*3/uL — ABNORMAL HIGH (ref 150–400)
RBC: 4.36 MIL/uL (ref 3.87–5.11)
RDW: 14.2 % (ref 11.5–15.5)
WBC: 9.4 10*3/uL (ref 4.0–10.5)

## 2014-02-01 NOTE — ED Notes (Signed)
Pt. reports right lateral/RLQ abdominal pain with nausea and vomitting onset 2 days ago , denies dysuria / no vaginal discharge . No fever or chills.

## 2014-02-02 ENCOUNTER — Encounter (HOSPITAL_COMMUNITY): Payer: Self-pay | Admitting: Emergency Medicine

## 2014-02-02 ENCOUNTER — Emergency Department (INDEPENDENT_AMBULATORY_CARE_PROVIDER_SITE_OTHER)
Admission: EM | Admit: 2014-02-02 | Discharge: 2014-02-02 | Disposition: A | Discharge status: Home / Self Care | Payer: Managed Care, Other (non HMO) | Source: Home / Self Care | Attending: Emergency Medicine | Admitting: Emergency Medicine

## 2014-02-02 DIAGNOSIS — N12 Tubulo-interstitial nephritis, not specified as acute or chronic: Secondary | ICD-10-CM

## 2014-02-02 LAB — POCT URINALYSIS DIP (DEVICE)
BILIRUBIN URINE: NEGATIVE
GLUCOSE, UA: NEGATIVE mg/dL
Ketones, ur: NEGATIVE mg/dL
Nitrite: POSITIVE — AB
Protein, ur: 30 mg/dL — AB
Urobilinogen, UA: 0.2 mg/dL (ref 0.0–1.0)
pH: 5.5 (ref 5.0–8.0)

## 2014-02-02 MED ORDER — CIPROFLOXACIN HCL 500 MG PO TABS: 500.0000 mg | ORAL_TABLET | Freq: Two times a day (BID) | ORAL | Status: DC

## 2014-02-02 NOTE — Discharge Instructions (Signed)
It would appear that you have infection in your urine that may have begun to spead to your kidney on the right. You should begin taking the antibiotic you have been prescribed and contact your primary care doctor for follow up appointment in the next 2-3 days. Stay well hydrated. If symptoms become worse or severe, please seek re-evaluation at your nearest ER. Keep in mind that because you are having pain on the right side of your abdomen, there is always the possibility of early appendicitis. If your symptoms become worse over the next 24 hours, please have yourself re-evaluated at the nearest ER.  Pyelonephritis, Adult Pyelonephritis is a kidney infection. In general, there are 2 main types of pyelonephritis:  Infections that come on quickly without any warning (acute pyelonephritis).  Infections that persist for a long period of time (chronic pyelonephritis). CAUSES  Two main causes of pyelonephritis are:  Bacteria traveling from the bladder to the kidney. This is a problem especially in pregnant women. The urine in the bladder can become filled with bacteria from multiple causes, including:  Inflammation of the prostate gland (prostatitis).  Sexual intercourse in females.  Bladder infection (cystitis).  Bacteria traveling from the bloodstream to the tissue part of the kidney. Problems that may increase your risk of getting a kidney infection include:  Diabetes.  Kidney stones or bladder stones.  Cancer.  Catheters placed in the bladder.  Other abnormalities of the kidney or ureter. SYMPTOMS   Abdominal pain.  Pain in the side or flank area.  Fever.  Chills.  Upset stomach.  Blood in the urine (dark urine).  Frequent urination.  Strong or persistent urge to urinate.  Burning or stinging when urinating. DIAGNOSIS  Your caregiver may diagnose your kidney infection based on your symptoms. A urine sample may also be taken. TREATMENT  In general, treatment depends on  how severe the infection is.   If the infection is mild and caught early, your caregiver may treat you with oral antibiotics and send you home.  If the infection is more severe, the bacteria may have gotten into the bloodstream. This will require intravenous (IV) antibiotics and a hospital stay. Symptoms may include:  High fever.  Severe flank pain.  Shaking chills.  Even after a hospital stay, your caregiver may require you to be on oral antibiotics for a period of time.  Other treatments may be required depending upon the cause of the infection. HOME CARE INSTRUCTIONS   Take your antibiotics as directed. Finish them even if you start to feel better.  Make an appointment to have your urine checked to make sure the infection is gone.  Drink enough fluids to keep your urine clear or pale yellow.  Take medicines for the bladder if you have urgency and frequency of urination as directed by your caregiver. SEEK IMMEDIATE MEDICAL CARE IF:   You have a fever or persistent symptoms for more than 2-3 days.  You have a fever and your symptoms suddenly get worse.  You are unable to take your antibiotics or fluids.  You develop shaking chills.  You experience extreme weakness or fainting.  There is no improvement after 2 days of treatment. MAKE SURE YOU:  Understand these instructions.  Will watch your condition.  Will get help right away if you are not doing well or get worse. Document Released: 07/12/2005 Document Revised: 01/11/2012 Document Reviewed: 12/16/2010 [facility] Patient Information 2015 [place], [place]. This information is not intended to replace advice given to you  by your health care provider. Make sure you discuss any questions you have with your health care provider. ° °

## 2014-02-02 NOTE — ED Provider Notes (Signed)
CSN: [id number]     Arrival date & time 02/02/14  0914 History   First MD Initiated Contact with Patient 02/02/14 [id number]     Chief Complaint  Patient presents with  . 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 2015 and normal CT A/P in Dec. 2014. Presented to [facility] for same last night but LWBS. Had CBC and CMET drawn in triage area and results appeared to be normal.   Patient is a 48 y.o. female presenting with abdominal pain. The history is provided by the patient.  Abdominal Pain Pain location:  RUQ Pain quality: sharp   Pain radiates to:  R flank and RLQ Pain severity:  Mild Onset quality:  Gradual Duration:  3 days Timing:  Intermittent Progression:  Waxing and waning Chronicity:  New Worsened by:  Eating and coughing (intercourse & emptying bladder) Associated symptoms: constipation and diarrhea   Associated symptoms: no anorexia, no belching, no chest pain, no chills, no cough, no dysuria, no fatigue, no fever, no flatus, no hematemesis, no hematochezia, no hematuria, no melena, no shortness of breath, no sore throat, no vaginal bleeding and no vaginal discharge     History reviewed. No pertinent past medical history. Past Surgical History  Procedure Laterality Date  . Abdominal hysterectomy      partial   Family History  Problem Relation Age of Onset  . Hyperlipidemia Mother   . Hypertension Mother   . Hypertension Father   . Hyperlipidemia Father   . Diabetes Father   . Heart attack Neg Hx   . Sudden death Neg Hx   . Cancer Sister     ovarian   History  Substance Use Topics  . Smoking status: Current Every Day Smoker    Types: Cigarettes  . Smokeless tobacco: Current User     Comment: electronic  . Alcohol Use: 1.2  oz/week    2 Glasses of wine per week     Comment: daily   OB History   Grav Para Term Preterm Abortions TAB SAB Ect Mult Living                 Review of Systems  Constitutional: Negative for fever, chills and fatigue.  HENT: Negative for sore throat.   Respiratory: Negative for cough and shortness of breath.   Cardiovascular: Negative for chest pain.  Gastrointestinal: Positive for abdominal pain, diarrhea and constipation. Negative for melena, hematochezia, anorexia, flatus and hematemesis.  Genitourinary: Negative for dysuria, hematuria, vaginal bleeding and vaginal discharge.  All other systems reviewed and are negative.   Allergies  Penicillins  Home Medications   Prior to Admission medications   Medication Sig Start Date End Date Taking? Authorizing Provider  ciprofloxacin (CIPRO) 500 MG tablet Take 1 tablet (500 mg total) by mouth 2 (two) times daily. X 7 days 02/02/14   Ardis Rowan, PA  diclofenac (VOLTAREN) 75 MG EC tablet Take 1 tablet (75 mg total) by mouth 2 (two) times daily. 09/06/13   Reuben Likes, MD  HYDROcodone-acetaminophen (NORCO/VICODIN) 5-325 MG per tablet 1 to 2 tabs every 4 to 6 hours as needed for pain. 09/06/13   Reuben Likes, MD  ibuprofen (ADVIL,MOTRIN) 800 MG tablet Take 1 tablet (800 mg total) by mouth 3 (three) times daily. 06/26/13   Hilario Quarry, MD  BP 107/70  Pulse 61  Temp(Src) 97.5 F (36.4 C) (Oral)  Resp 18  SpO2 100% Physical Exam  Nursing note and vitals reviewed. Constitutional: She is oriented to person, place, and time. She appears well-developed and well-nourished. No distress.  HENT:  Head: Normocephalic and atraumatic.  Eyes: Conjunctivae are normal. No scleral icterus.  Cardiovascular: Normal rate, regular rhythm and normal heart sounds.   Pulmonary/Chest: Effort normal and breath sounds normal. No respiratory distress. She has no wheezes.  Abdominal: Soft. Normal appearance and bowel sounds are normal. She  exhibits no distension and no mass. There is no hepatosplenomegaly. There is tenderness in the right upper quadrant and right lower quadrant. There is CVA tenderness. No hernia.  Mild right CVAT with mild tenderness along entire side right abdomen  Musculoskeletal: Normal range of motion.  Neurological: She is alert and oriented to person, place, and time.  Skin: Skin is warm and dry. No rash noted. No erythema.  Psychiatric: She has a normal mood and affect. Her behavior is normal.    ED Course  Procedures (including critical care time) Labs Review Labs Reviewed  POCT URINALYSIS DIP (DEVICE) - Abnormal; Notable for the following:    Hgb urine dipstick LARGE (*)    Protein, ur 30 (*)    Nitrite POSITIVE (*)    Leukocytes, UA SMALL (*)    All other components within normal limits  URINE CULTURE    Imaging Review No results found.   MDM   1. Pyelonephritis    Given normal CMET and CBC in last 24 hours and that our UA reveals UTI and exam with mild CVAT on right, will treat for early pyelonephritis with Cipro. Urine sent for C&S. Cautioned patient about possibility of early appendicitis given right sided abdominal discomfort and she voices understanding of how to monitor her symptoms at home and reasons for which she should seek re-evaluation at her nearest ER.    Jess BartersJennifer Lee EmpirePresson, GeorgiaPA 02/02/14 1038

## 2014-02-02 NOTE — ED Notes (Signed)
Pt  Reports  r  Sided  abd  Pain  For  Several  Days           Some  Nausea  Ambulates  Upright  With a  Steady fluid  Gait         Pt  Was  In  Er  Last  Pm       Had  Blood  Work  Drawn then walked  Out  Due  To  Wait time

## 2014-02-05 LAB — URINE CULTURE: SPECIAL REQUESTS: NORMAL

## 2014-02-05 NOTE — ED Notes (Signed)
Urine culture: >100,000 colonies E. Coli. Pt. adequately treated with Cipro. Vassie MoselleYork, Horst Ostermiller M 02/05/2014

## 2014-02-05 NOTE — ED Provider Notes (Signed)
Medical screening examination/treatment/procedure(s) were performed by non-physician practitioner and as supervising physician I was immediately available for consultation/collaboration.  Leslee Homeavid Leetta Hendriks, M.D.  Reuben Likesavid C Edu On, MD 02/05/14 [phone]

## 2014-02-27 ENCOUNTER — Other Ambulatory Visit: Payer: Self-pay | Admitting: Obstetrics and Gynecology

## 2014-02-27 DIAGNOSIS — Z1231 Encounter for screening mammogram for malignant neoplasm of breast: Secondary | ICD-10-CM

## 2014-03-12 ENCOUNTER — Ambulatory Visit
Admission: RE | Admit: 2014-03-12 | Discharge: 2014-03-12 | Disposition: A | Discharge status: Home / Self Care | Payer: Managed Care, Other (non HMO) | Source: Ambulatory Visit | Attending: Obstetrics and Gynecology | Admitting: Obstetrics and Gynecology

## 2014-03-12 DIAGNOSIS — Z1231 Encounter for screening mammogram for malignant neoplasm of breast: Secondary | ICD-10-CM

## 2014-04-17 ENCOUNTER — Other Ambulatory Visit: Payer: Self-pay | Admitting: Internal Medicine

## 2014-04-17 DIAGNOSIS — M7989 Other specified soft tissue disorders: Secondary | ICD-10-CM

## 2014-04-23 ENCOUNTER — Other Ambulatory Visit: Payer: Managed Care, Other (non HMO)

## 2014-04-24 ENCOUNTER — Other Ambulatory Visit: Payer: Self-pay | Admitting: Obstetrics and Gynecology

## 2014-04-24 DIAGNOSIS — R1900 Intra-abdominal and pelvic swelling, mass and lump, unspecified site: Secondary | ICD-10-CM

## 2014-04-30 ENCOUNTER — Ambulatory Visit
Admission: RE | Admit: 2014-04-30 | Discharge: 2014-04-30 | Disposition: A | Discharge status: Home / Self Care | Payer: Managed Care, Other (non HMO) | Source: Ambulatory Visit | Attending: Obstetrics and Gynecology | Admitting: Obstetrics and Gynecology

## 2014-04-30 DIAGNOSIS — R1900 Intra-abdominal and pelvic swelling, mass and lump, unspecified site: Secondary | ICD-10-CM

## 2014-04-30 MED ORDER — GADOBENATE DIMEGLUMINE 529 MG/ML IV SOLN
20.0000 mL | Freq: Once | INTRAVENOUS | Status: AC | PRN
  Administered 2014-04-30: 20 mL via INTRAVENOUS

## 2014-05-02 ENCOUNTER — Other Ambulatory Visit: Payer: Managed Care, Other (non HMO)

## 2014-05-27 ENCOUNTER — Other Ambulatory Visit (INDEPENDENT_AMBULATORY_CARE_PROVIDER_SITE_OTHER): Payer: Self-pay

## 2014-05-27 ENCOUNTER — Other Ambulatory Visit (INDEPENDENT_AMBULATORY_CARE_PROVIDER_SITE_OTHER): Payer: Self-pay | Admitting: General Surgery

## 2014-05-27 DIAGNOSIS — E278 Other specified disorders of adrenal gland: Secondary | ICD-10-CM | POA: Insufficient documentation

## 2014-05-29 ENCOUNTER — Telehealth (INDEPENDENT_AMBULATORY_CARE_PROVIDER_SITE_OTHER): Payer: Self-pay

## 2014-06-04 ENCOUNTER — Inpatient Hospital Stay: Admission: RE | Admit: 2014-06-04 | Payer: Managed Care, Other (non HMO) | Source: Ambulatory Visit

## 2015-04-05 ENCOUNTER — Emergency Department (HOSPITAL_COMMUNITY): Payer: Managed Care, Other (non HMO)

## 2015-04-05 ENCOUNTER — Emergency Department (HOSPITAL_COMMUNITY)
Admission: EM | Admit: 2015-04-05 | Discharge: 2015-04-05 | Disposition: A | Discharge status: Home / Self Care | Payer: Managed Care, Other (non HMO) | Attending: Emergency Medicine | Admitting: Emergency Medicine

## 2015-04-05 ENCOUNTER — Encounter (HOSPITAL_COMMUNITY): Payer: Self-pay | Admitting: Emergency Medicine

## 2015-04-05 DIAGNOSIS — S52602A Unspecified fracture of lower end of left ulna, initial encounter for closed fracture: Secondary | ICD-10-CM | POA: Insufficient documentation

## 2015-04-05 DIAGNOSIS — Y9389 Activity, other specified: Secondary | ICD-10-CM | POA: Insufficient documentation

## 2015-04-05 DIAGNOSIS — S0990XA Unspecified injury of head, initial encounter: Secondary | ICD-10-CM | POA: Diagnosis not present

## 2015-04-05 DIAGNOSIS — Y92410 Unspecified street and highway as the place of occurrence of the external cause: Secondary | ICD-10-CM | POA: Diagnosis not present

## 2015-04-05 DIAGNOSIS — Y998 Other external cause status: Secondary | ICD-10-CM | POA: Insufficient documentation

## 2015-04-05 DIAGNOSIS — Z720 Tobacco use: Secondary | ICD-10-CM | POA: Diagnosis not present

## 2015-04-05 DIAGNOSIS — Z880 Allergy status to penicillin: Secondary | ICD-10-CM | POA: Diagnosis not present

## 2015-04-05 DIAGNOSIS — S52202A Unspecified fracture of shaft of left ulna, initial encounter for closed fracture: Secondary | ICD-10-CM

## 2015-04-05 DIAGNOSIS — S59912A Unspecified injury of left forearm, initial encounter: Secondary | ICD-10-CM | POA: Diagnosis present

## 2015-04-05 MED ORDER — MORPHINE SULFATE (PF) 4 MG/ML IV SOLN
4.0000 mg | Freq: Once | INTRAVENOUS | Status: AC
  Administered 2015-04-05: 4 mg via INTRAVENOUS
  Filled 2015-04-05: qty 1

## 2015-04-05 MED ORDER — OXYCODONE-ACETAMINOPHEN 5-325 MG PO TABS
1.0000 | ORAL_TABLET | Freq: Once | ORAL | Status: AC
  Administered 2015-04-05: 1 via ORAL
  Filled 2015-04-05: qty 1

## 2015-04-05 MED ORDER — OXYCODONE-ACETAMINOPHEN 5-325 MG PO TABS: 2.0000 | ORAL_TABLET | ORAL | Status: DC | PRN

## 2015-04-05 NOTE — Discharge Instructions (Signed)
Cast or Splint Care °Casts and splints support injured limbs and keep bones from moving while they heal. It is important to care for your cast or splint at home.   °HOME CARE INSTRUCTIONS °· Keep the cast or splint uncovered during the drying period. It can take 24 to 48 hours to dry if it is made of plaster. A fiberglass cast will dry in less than 1 hour. °· Do not rest the cast on anything harder than a pillow for the first 24 hours. °· Do not put weight on your injured limb or apply pressure to the cast until your health care provider gives you permission. °· Keep the cast or splint dry. Wet casts or splints can lose their shape and may not support the limb as well. A wet cast that has lost its shape can also create harmful pressure on your skin when it dries. Also, wet skin can become infected. °¨ Cover the cast or splint with a plastic bag when bathing or when out in the rain or snow. If the cast is on the trunk of the body, take sponge baths until the cast is removed. °¨ If your cast does become wet, dry it with a towel or a blow dryer on the cool setting only. °· Keep your cast or splint clean. Soiled casts may be wiped with a moistened cloth. °· Do not place any hard or soft foreign objects under your cast or splint, such as cotton, toilet paper, lotion, or powder. °· Do not try to scratch the skin under the cast with any object. The object could get stuck inside the cast. Also, scratching could lead to an infection. If itching is a problem, use a blow dryer on a cool setting to relieve discomfort. °· Do not trim or cut your cast or remove padding from inside of it. °· Exercise all joints next to the injury that are not immobilized by the cast or splint. For example, if you have a long leg cast, exercise the hip joint and toes. If you have an arm cast or splint, exercise the shoulder, elbow, thumb, and fingers. °· Elevate your injured arm or leg on 1 or 2 pillows for the first 1 to 3 days to decrease  swelling and pain. It is best if you can comfortably elevate your cast so it is higher than your heart. °SEEK MEDICAL CARE IF:  °· Your cast or splint cracks. °· Your cast or splint is too tight or too loose. °· You have unbearable itching inside the cast. °· Your cast becomes wet or develops a soft spot or area. °· You have a bad smell coming from inside your cast. °· You get an object stuck under your cast. °· Your skin around the cast becomes red or raw. °· You have new pain or worsening pain after the cast has been applied. °SEEK IMMEDIATE MEDICAL CARE IF:  °· You have fluid leaking through the cast. °· You are unable to move your fingers or toes. °· You have discolored (blue or white), cool, painful, or very swollen fingers or toes beyond the cast. °· You have tingling or numbness around the injured area. °· You have severe pain or pressure under the cast. °· You have any difficulty with your breathing or have shortness of breath. °· You have chest pain. °Document Released: 07/09/2000 Document Revised: 05/02/2013 Document Reviewed: 01/18/2013 °ExitCare® Patient Information ©2015 [employer], [employer]. This information is not intended to replace advice given to you by your health care   provider. Make sure you discuss any questions you have with your health care provider.  Forearm Fracture Your caregiver has diagnosed you as having a broken bone (fracture) of the forearm. This is the part of your arm between the elbow and your wrist. Your forearm is made up of two bones. These are the radius and ulna. A fracture is a break in one or both bones. A cast or splint is used to protect and keep your injured bone from moving. The cast or splint will be on generally for about 5 to 6 weeks, with individual variations. HOME CARE INSTRUCTIONS   Keep the injured part elevated while sitting or lying down. Keeping the injury above the level of your heart (the center of the chest). This will decrease swelling and pain.  Apply  ice to the injury for 15-20 minutes, 03-04 times per day while awake, for 2 days. Put the ice in a plastic bag and place a thin towel between the bag of ice and your cast or splint.  If you have a plaster or fiberglass cast:  Do not try to scratch the skin under the cast using sharp or pointed objects.  Check the skin around the cast every day. You may put lotion on any red or sore areas.  Keep your cast dry and clean.  If you have a plaster splint:  Wear the splint as directed.  You may loosen the elastic around the splint if your fingers become numb, tingle, or turn cold or blue.  Do not put pressure on any part of your cast or splint. It may break. Rest your cast only on a pillow the first 24 hours until it is fully hardened.  Your cast or splint can be protected during bathing with a plastic bag. Do not lower the cast or splint into water.  Only take over-the-counter or prescription medicines for pain, discomfort, or fever as directed by your caregiver. SEEK IMMEDIATE MEDICAL CARE IF:   Your cast gets damaged or breaks.  You have more severe pain or swelling than you did before the cast.  Your skin or nails below the injury turn blue or gray, or feel cold or numb.  There is a bad smell or new stains and/or pus like (purulent) drainage coming from under the cast. MAKE SURE YOU:   Understand these instructions.  Will watch your condition.  Will get help right away if you are not doing well or get worse. Document Released: 07/09/2000 Document Revised: 10/04/2011 Document Reviewed: 02/29/2008 [facility] Patient Information 2015 [place], [place]. This information is not intended to replace advice given to you by your health care provider. Make sure you discuss any questions you have with your health care provider.  Follow up with ortho ASAP.
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
## 2015-04-05 NOTE — ED Notes (Signed)
Patient transported to X-ray 
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
## 2015-04-05 NOTE — ED Notes (Signed)
Patient transported to CT 
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
## 2015-04-10 ENCOUNTER — Other Ambulatory Visit (HOSPITAL_COMMUNITY): Payer: Self-pay | Admitting: Orthopaedic Surgery

## 2015-04-11 ENCOUNTER — Encounter (HOSPITAL_COMMUNITY): Payer: Self-pay | Admitting: *Deleted

## 2015-04-11 NOTE — Progress Notes (Signed)
Pt denies any cardiac history, chest pain or sob. 

## 2015-04-14 ENCOUNTER — Ambulatory Visit (HOSPITAL_COMMUNITY): Payer: Managed Care, Other (non HMO) | Admitting: Certified Registered Nurse Anesthetist

## 2015-04-14 ENCOUNTER — Ambulatory Visit (HOSPITAL_COMMUNITY): Payer: Managed Care, Other (non HMO)

## 2015-04-14 ENCOUNTER — Encounter (HOSPITAL_COMMUNITY)
Admission: RE | Disposition: A | Discharge status: Home / Self Care | Payer: Self-pay | Source: Ambulatory Visit | Attending: Orthopaedic Surgery

## 2015-04-14 ENCOUNTER — Ambulatory Visit (HOSPITAL_COMMUNITY)
Admission: RE | Admit: 2015-04-14 | Discharge: 2015-04-14 | Disposition: A | Discharge status: Home / Self Care | Payer: Managed Care, Other (non HMO) | Source: Ambulatory Visit | Attending: Orthopaedic Surgery | Admitting: Orthopaedic Surgery

## 2015-04-14 ENCOUNTER — Encounter (HOSPITAL_COMMUNITY): Payer: Self-pay | Admitting: Certified Registered Nurse Anesthetist

## 2015-04-14 DIAGNOSIS — S63522A Sprain of radiocarpal joint of left wrist, initial encounter: Secondary | ICD-10-CM | POA: Insufficient documentation

## 2015-04-14 DIAGNOSIS — Z6830 Body mass index (BMI) 30.0-30.9, adult: Secondary | ICD-10-CM | POA: Insufficient documentation

## 2015-04-14 DIAGNOSIS — S52202A Unspecified fracture of shaft of left ulna, initial encounter for closed fracture: Secondary | ICD-10-CM | POA: Diagnosis present

## 2015-04-14 DIAGNOSIS — S52209A Unspecified fracture of shaft of unspecified ulna, initial encounter for closed fracture: Secondary | ICD-10-CM

## 2015-04-14 DIAGNOSIS — F17210 Nicotine dependence, cigarettes, uncomplicated: Secondary | ICD-10-CM | POA: Insufficient documentation

## 2015-04-14 HISTORY — PX: ORIF ULNAR FRACTURE: SHX5417

## 2015-04-14 HISTORY — DX: Iron deficiency: E61.1

## 2015-04-14 LAB — COMPREHENSIVE METABOLIC PANEL
ALBUMIN: 3.6 g/dL (ref 3.5–5.0)
ALK PHOS: 73 U/L (ref 38–126)
ALT: 20 U/L (ref 14–54)
ANION GAP: 8 (ref 5–15)
AST: 24 U/L (ref 15–41)
BILIRUBIN TOTAL: 0.5 mg/dL (ref 0.3–1.2)
BUN: 12 mg/dL (ref 6–20)
CALCIUM: 9.7 mg/dL (ref 8.9–10.3)
CO2: 25 mmol/L (ref 22–32)
Chloride: 106 mmol/L (ref 101–111)
Creatinine, Ser: 0.77 mg/dL (ref 0.44–1.00)
GFR calc Af Amer: >60 mL/min (ref >60)
GLUCOSE: 95 mg/dL (ref 65–99)
Potassium: 4.2 mmol/L (ref 3.5–5.1)
Sodium: 139 mmol/L (ref 135–145)
TOTAL PROTEIN: 7 g/dL (ref 6.5–8.1)

## 2015-04-14 LAB — CBC
HEMATOCRIT: 39.2 % (ref 36.0–46.0)
HEMOGLOBIN: 12.6 g/dL (ref 12.0–15.0)
MCH: 27.5 pg (ref 26.0–34.0)
MCHC: 32.1 g/dL (ref 30.0–36.0)
MCV: 85.4 fL (ref 78.0–100.0)
Platelets: 368 10*3/uL (ref 150–400)
RBC: 4.59 MIL/uL (ref 3.87–5.11)
RDW: 14.1 % (ref 11.5–15.5)
WBC: 7.2 10*3/uL (ref 4.0–10.5)

## 2015-04-14 LAB — PROTIME-INR
INR: 1.02 (ref 0.00–1.49)
Prothrombin Time: 13.6 seconds (ref 11.6–15.2)

## 2015-04-14 SURGERY — OPEN REDUCTION INTERNAL FIXATION (ORIF) ULNAR FRACTURE
Anesthesia: General | Site: Arm Lower | Laterality: Left

## 2015-04-14 MED ORDER — PHENYLEPHRINE HCL 10 MG/ML IJ SOLN
INTRAMUSCULAR | Status: DC | PRN
  Administered 2015-04-14 (×2): 80 ug via INTRAVENOUS

## 2015-04-14 MED ORDER — FENTANYL CITRATE (PF) 250 MCG/5ML IJ SOLN
INTRAMUSCULAR | Status: AC
  Filled 2015-04-14: qty 5

## 2015-04-14 MED ORDER — HYDROMORPHONE HCL 1 MG/ML IJ SOLN
0.2500 mg | INTRAMUSCULAR | Status: DC | PRN
  Administered 2015-04-14: 0.5 mg via INTRAVENOUS

## 2015-04-14 MED ORDER — PROPOFOL 10 MG/ML IV BOLUS
INTRAVENOUS | Status: DC | PRN
  Administered 2015-04-14: 200 mg via INTRAVENOUS

## 2015-04-14 MED ORDER — LACTATED RINGERS IV SOLN
Freq: Once | INTRAVENOUS | Status: AC
  Administered 2015-04-14 (×2): via INTRAVENOUS

## 2015-04-14 MED ORDER — CLINDAMYCIN PHOSPHATE 900 MG/50ML IV SOLN
900.0000 mg | INTRAVENOUS | Status: AC
  Administered 2015-04-14: 900 mg via INTRAVENOUS
  Filled 2015-04-14: qty 50

## 2015-04-14 MED ORDER — CHLORHEXIDINE GLUCONATE 4 % EX LIQD: 60.0000 mL | Freq: Once | CUTANEOUS | Status: DC

## 2015-04-14 MED ORDER — MIDAZOLAM HCL 2 MG/2ML IJ SOLN
INTRAMUSCULAR | Status: AC
Start: 2015-04-14 — End: 2015-04-14
  Filled 2015-04-14: qty 4

## 2015-04-14 MED ORDER — FENTANYL CITRATE (PF) 100 MCG/2ML IJ SOLN
100.0000 ug | Freq: Once | INTRAMUSCULAR | Status: AC
  Administered 2015-04-14: 100 ug via INTRAVENOUS

## 2015-04-14 MED ORDER — BUPIVACAINE-EPINEPHRINE (PF) 0.5% -1:200000 IJ SOLN
INTRAMUSCULAR | Status: DC | PRN
  Administered 2015-04-14: 30 mL via PERINEURAL

## 2015-04-14 MED ORDER — ONDANSETRON HCL 4 MG/2ML IJ SOLN
INTRAMUSCULAR | Status: AC
  Filled 2015-04-14: qty 2

## 2015-04-14 MED ORDER — BUPIVACAINE HCL (PF) 0.25 % IJ SOLN
INTRAMUSCULAR | Status: DC | PRN
  Administered 2015-04-14: 10 mL

## 2015-04-14 MED ORDER — NEOSTIGMINE METHYLSULFATE 10 MG/10ML IV SOLN
INTRAVENOUS | Status: AC
  Filled 2015-04-14: qty 1

## 2015-04-14 MED ORDER — LIDOCAINE HCL (CARDIAC) 20 MG/ML IV SOLN
INTRAVENOUS | Status: DC | PRN
Start: 2015-04-14 — End: 2015-04-14
  Administered 2015-04-14: 20 mg via INTRAVENOUS

## 2015-04-14 MED ORDER — GLYCOPYRROLATE 0.2 MG/ML IJ SOLN
INTRAMUSCULAR | Status: AC
  Filled 2015-04-14: qty 3

## 2015-04-14 MED ORDER — ROCURONIUM BROMIDE 50 MG/5ML IV SOLN
INTRAVENOUS | Status: AC
Start: 2015-04-14 — End: 2015-04-14
  Filled 2015-04-14: qty 1

## 2015-04-14 MED ORDER — OXYCODONE-ACETAMINOPHEN 5-325 MG PO TABS: 1.0000 | ORAL_TABLET | Freq: Four times a day (QID) | ORAL | Status: DC | PRN

## 2015-04-14 MED ORDER — MIDAZOLAM HCL 2 MG/2ML IJ SOLN: 2.0000 mg | Freq: Once | INTRAMUSCULAR | Status: AC

## 2015-04-14 MED ORDER — HYDROMORPHONE HCL 1 MG/ML IJ SOLN
INTRAMUSCULAR | Status: AC
  Filled 2015-04-14: qty 1

## 2015-04-14 MED ORDER — FENTANYL CITRATE (PF) 100 MCG/2ML IJ SOLN
INTRAMUSCULAR | Status: DC | PRN
  Administered 2015-04-14: 50 ug via INTRAVENOUS

## 2015-04-14 MED ORDER — ARTIFICIAL TEARS OP OINT
TOPICAL_OINTMENT | OPHTHALMIC | Status: AC
  Filled 2015-04-14: qty 3.5

## 2015-04-14 MED ORDER — MIDAZOLAM HCL 2 MG/2ML IJ SOLN: 0.5000 mg | Freq: Once | INTRAMUSCULAR | Status: DC | PRN

## 2015-04-14 MED ORDER — FENTANYL CITRATE (PF) 100 MCG/2ML IJ SOLN
INTRAMUSCULAR | Status: AC
  Administered 2015-04-14: 100 ug via INTRAVENOUS
  Filled 2015-04-14: qty 2

## 2015-04-14 MED ORDER — BUPIVACAINE HCL (PF) 0.25 % IJ SOLN
INTRAMUSCULAR | Status: AC
  Filled 2015-04-14: qty 30

## 2015-04-14 MED ORDER — MEPERIDINE HCL 25 MG/ML IJ SOLN: 6.2500 mg | INTRAMUSCULAR | Status: DC | PRN

## 2015-04-14 MED ORDER — PROMETHAZINE HCL 25 MG/ML IJ SOLN: 6.2500 mg | INTRAMUSCULAR | Status: DC | PRN

## 2015-04-14 MED ORDER — ONDANSETRON HCL 4 MG/2ML IJ SOLN
INTRAMUSCULAR | Status: DC | PRN
Start: 2015-04-14 — End: 2015-04-14
  Administered 2015-04-14: 4 mg via INTRAVENOUS

## 2015-04-14 MED ORDER — ARTIFICIAL TEARS OP OINT
TOPICAL_OINTMENT | OPHTHALMIC | Status: DC | PRN
  Administered 2015-04-14: 1 via OPHTHALMIC

## 2015-04-14 MED ORDER — LIDOCAINE HCL (CARDIAC) 20 MG/ML IV SOLN
INTRAVENOUS | Status: AC
  Filled 2015-04-14: qty 5

## 2015-04-14 MED ORDER — SODIUM CHLORIDE 0.9 % IV SOLN
10.0000 mg | INTRAVENOUS | Status: DC | PRN
  Administered 2015-04-14: 15 ug/min via INTRAVENOUS

## 2015-04-14 MED ORDER — MIDAZOLAM HCL 2 MG/2ML IJ SOLN
INTRAMUSCULAR | Status: AC
  Administered 2015-04-14: 2 mg
  Filled 2015-04-14: qty 2

## 2015-04-14 MED ORDER — PHENYLEPHRINE 40 MCG/ML (10ML) SYRINGE FOR IV PUSH (FOR BLOOD PRESSURE SUPPORT)
PREFILLED_SYRINGE | INTRAVENOUS | Status: AC
  Filled 2015-04-14: qty 10

## 2015-04-14 MED ORDER — PROPOFOL 10 MG/ML IV BOLUS
INTRAVENOUS | Status: AC
  Filled 2015-04-14: qty 20

## 2015-04-14 SURGICAL SUPPLY — 65 items
BANDAGE ELASTIC 3 VELCRO ST LF (GAUZE/BANDAGES/DRESSINGS) ×3 IMPLANT
BANDAGE ELASTIC 4 VELCRO ST LF (GAUZE/BANDAGES/DRESSINGS) ×3 IMPLANT
BIT DRILL 2.5X110 QC LCP DISP (BIT) ×3 IMPLANT
BNDG ESMARK 4X9 LF (GAUZE/BANDAGES/DRESSINGS) IMPLANT
CLOSURE STERI-STRIP 1/2X4 (GAUZE/BANDAGES/DRESSINGS) ×1
CLOSURE WOUND 1/2 X4 (GAUZE/BANDAGES/DRESSINGS) ×1
CLSR STERI-STRIP ANTIMIC 1/2X4 (GAUZE/BANDAGES/DRESSINGS) ×2 IMPLANT
CORDS BIPOLAR (ELECTRODE) ×3 IMPLANT
COVER SURGICAL LIGHT HANDLE (MISCELLANEOUS) ×3 IMPLANT
DRAPE OEC MINIVIEW 54X84 (DRAPES) IMPLANT
DRSG PAD ABDOMINAL 8X10 ST (GAUZE/BANDAGES/DRESSINGS) IMPLANT
DURAPREP 26ML APPLICATOR (WOUND CARE) ×3 IMPLANT
ELECT REM PT RETURN 9FT ADLT (ELECTROSURGICAL) ×3
ELECTRODE REM PT RTRN 9FT ADLT (ELECTROSURGICAL) ×1 IMPLANT
GAUZE SPONGE 4X4 12PLY STRL (GAUZE/BANDAGES/DRESSINGS) IMPLANT
GAUZE XEROFORM 1X8 LF (GAUZE/BANDAGES/DRESSINGS) IMPLANT
GLOVE BIOGEL PI IND STRL 7.5 (GLOVE) ×1 IMPLANT
GLOVE BIOGEL PI IND STRL 8 (GLOVE) ×1 IMPLANT
GLOVE BIOGEL PI INDICATOR 7.5 (GLOVE) ×2
GLOVE BIOGEL PI INDICATOR 8 (GLOVE) ×2
GLOVE ECLIPSE 7.0 STRL STRAW (GLOVE) ×3 IMPLANT
GLOVE ORTHO TXT STRL SZ7.5 (GLOVE) ×3 IMPLANT
GOWN STRL REUS W/ TWL LRG LVL3 (GOWN DISPOSABLE) ×1 IMPLANT
GOWN STRL REUS W/ TWL XL LVL3 (GOWN DISPOSABLE) ×1 IMPLANT
GOWN STRL REUS W/TWL LRG LVL3 (GOWN DISPOSABLE) ×2
GOWN STRL REUS W/TWL XL LVL3 (GOWN DISPOSABLE) ×2
K-WIRE 1.4X100 (WIRE)
KIT BASIN OR (CUSTOM PROCEDURE TRAY) ×3 IMPLANT
KIT ROOM TURNOVER OR (KITS) ×3 IMPLANT
KWIRE 1.4X100 (WIRE) IMPLANT
MANIFOLD NEPTUNE II (INSTRUMENTS) ×3 IMPLANT
NEEDLE 22X1 1/2 (OR ONLY) (NEEDLE) IMPLANT
NS IRRIG 1000ML POUR BTL (IV SOLUTION) ×3 IMPLANT
PACK ORTHO EXTREMITY (CUSTOM PROCEDURE TRAY) ×3 IMPLANT
PAD ARMBOARD 7.5X6 YLW CONV (MISCELLANEOUS) ×6 IMPLANT
PAD CAST 4YDX4 CTTN HI CHSV (CAST SUPPLIES) IMPLANT
PADDING CAST ABS 3INX4YD NS (CAST SUPPLIES) ×2
PADDING CAST ABS 4INX4YD NS (CAST SUPPLIES) ×2
PADDING CAST ABS COTTON 3X4 (CAST SUPPLIES) ×1 IMPLANT
PADDING CAST ABS COTTON 4X4 ST (CAST SUPPLIES) ×1 IMPLANT
PADDING CAST COTTON 4X4 STRL (CAST SUPPLIES)
PLATE LC DCP 8H 103MM (Plate) ×3 IMPLANT
SCREW CORTEX 3.5 14MM (Screw) ×10 IMPLANT
SCREW CORTEX 3.5 16MM (Screw) ×6 IMPLANT
SCREW LOCK CORT ST 3.5X14 (Screw) ×5 IMPLANT
SCREW LOCK CORT ST 3.5X16 (Screw) ×3 IMPLANT
SPLINT FIBERGLASS 3X35 (CAST SUPPLIES) ×3 IMPLANT
SPONGE GAUZE 4X4 12PLY STER LF (GAUZE/BANDAGES/DRESSINGS) ×3 IMPLANT
SPONGE LAP 4X18 X RAY DECT (DISPOSABLE) ×6 IMPLANT
STAPLER VISISTAT 35W (STAPLE) ×3 IMPLANT
STRIP CLOSURE SKIN 1/2X4 (GAUZE/BANDAGES/DRESSINGS) ×2 IMPLANT
SUCTION FRAZIER TIP 10 FR DISP (SUCTIONS) ×3 IMPLANT
SUT PROLENE 3 0 PS 1 (SUTURE) ×3 IMPLANT
SUT VIC AB 2-0 CT1 27 (SUTURE)
SUT VIC AB 2-0 CT1 TAPERPNT 27 (SUTURE) IMPLANT
SUT VIC AB 3-0 X1 27 (SUTURE) ×3 IMPLANT
SUT VICRYL 4-0 PS2 18IN ABS (SUTURE) IMPLANT
SYR CONTROL 10ML LL (SYRINGE) IMPLANT
TOWEL OR 17X24 6PK STRL BLUE (TOWEL DISPOSABLE) ×3 IMPLANT
TOWEL OR 17X26 10 PK STRL BLUE (TOWEL DISPOSABLE) ×3 IMPLANT
TUBE CONNECTING 12'X1/4 (SUCTIONS) ×1
TUBE CONNECTING 12X1/4 (SUCTIONS) ×2 IMPLANT
UNDERPAD 30X30 INCONTINENT (UNDERPADS AND DIAPERS) ×3 IMPLANT
WATER STERILE IRR 1000ML POUR (IV SOLUTION) ×3 IMPLANT
YANKAUER SUCT BULB TIP NO VENT (SUCTIONS) ×3 IMPLANT

## 2015-04-14 NOTE — Anesthesia Postprocedure Evaluation (Signed)
  Anesthesia Post-op Note  Patient: Lauren Boyle  Procedure(s) Performed: Procedure(s): OPEN REDUCTION INTERNAL FIXATION (ORIF) ULNAR FRACTURE (Left)  Patient Location: PACU  Anesthesia Type:GA combined with regional for post-op pain  Level of Consciousness: awake, alert , oriented and patient cooperative  Airway and Oxygen Therapy: Patient Spontanous Breathing  Post-op Pain: none  Post-op Assessment: Post-op Vital signs reviewed, Patient's Cardiovascular Status Stable, Respiratory Function Stable, Patent Airway, No signs of Nausea or vomiting and Pain level controlled              Post-op Vital Signs: Reviewed and stable  Last Vitals:  Filed Vitals:   04/14/15 1328  BP: 134/77  Pulse: 59  Temp:   Resp: 16    Complications: No apparent anesthesia complications

## 2015-04-14 NOTE — Anesthesia Procedure Notes (Addendum)
Anesthesia Regional Block:  Supraclavicular block  Pre-Anesthetic Checklist: ,, timeout performed, Correct Patient, Correct Site, Correct Laterality, Correct Procedure, Correct Position, site marked, Risks and benefits discussed,  Surgical consent,  Pre-op evaluation,  At surgeon's request and post-op pain management  Laterality: Left and Upper  Prep: chloraprep       Needles:  Injection technique: Single-shot  Needle Type: Echogenic Stimulator Needle     Needle Length: 5cm 5 cm Needle Gauge: 22 and 22 G    Additional Needles:  Procedures: ultrasound guided (picture in chart) and nerve stimulator Supraclavicular block  Nerve Stimulator or Paresthesia:  Response: forearm twitch, 0.4 mA, 0.1 ms,   Additional Responses:   Narrative:  Start time: 04/14/2015 10:21 AM End time: 04/14/2015 10:27 AM Injection made incrementally with aspirations every 5 mL.  Performed by: Personally  Anesthesiologist: Jean Rosenthal, CARSWELL  Additional Notes: Pt identified in Holding room.  Monitors applied. Working IV access confirmed. Sterile prep, drape L clavicle.  #22ga ECHOgenic PNS to brachial plexus with US guidance, forearm twitch at 0.40 mA threshold.  30cc 0.5% Bupivacaine with 1:200k epi injected incrementally after negative test dose.  Patient asymptomatic, VSS, no heme aspirated, tolerated well.  Sandford Craze, MD   Procedure Name: Intubation Date/Time: 04/14/2015 10:50 AM Performed by: Demetrio Lapping Pre-anesthesia Checklist: Patient identified, Emergency Drugs available, Suction available, Patient being monitored and Timeout performed Patient Re-evaluated:Patient Re-evaluated prior to inductionOxygen Delivery Method: Circle system utilized Preoxygenation: Pre-oxygenation with 100% oxygen Intubation Type: IV induction Ventilation: Mask ventilation without difficulty LMA: LMA inserted LMA Size: 4.0 Number of attempts: 1 Placement Confirmation: positive ETCO2 and breath sounds checked-  equal and bilateral Tube secured with: Tape Dental Injury: Teeth and Oropharynx as per pre-operative assessment

## 2015-04-14 NOTE — Brief Op Note (Signed)
04/14/2015  12:07 PM  PATIENT:  Lauren Boyle  49 y.o. female  PRE-OPERATIVE DIAGNOSIS:  Left Ulna Fracture  POST-OPERATIVE DIAGNOSIS:  Left Ulna Fracture  PROCEDURE:  Procedure(s): OPEN REDUCTION INTERNAL FIXATION (ORIF) ULNAR FRACTURE (Left)  SURGEON:  Surgeon(s) and Role:    * Eldred Manges, MD - Primary  PHYSICIAN ASSISTANT: Zonia Kief     ANESTHESIA:   general  EBL:  Total I/O In: 1000 [I.V.:1000] Out: -   BLOOD ADMINISTERED:none  DRAINS: none   LOCAL MEDICATIONS USED:  MARCAINE     SPECIMEN:  No Specimen  DISPOSITION OF SPECIMEN:  N/A  COUNTS:  YES  TOURNIQUET:   Total Tourniquet Time Documented: Upper Arm (Left) - 23 minutes Total: Upper Arm (Left) - 23 minutes   PATIENT DISPOSITION:  PACU - hemodynamically stable.

## 2015-04-14 NOTE — Transfer of Care (Signed)
Immediate Anesthesia Transfer of Care Note  Patient: Lauren Boyle  Procedure(s) Performed: Procedure(s): OPEN REDUCTION INTERNAL FIXATION (ORIF) ULNAR FRACTURE (Left)  Patient Location: PACU  Anesthesia Type:General  Level of Consciousness: awake, alert , oriented and patient cooperative  Airway & Oxygen Therapy: Patient Spontanous Breathing and Patient connected to nasal cannula oxygen  Post-op Assessment: Report given to RN and Post -op Vital signs reviewed and stable  Post vital signs: Reviewed and stable  Last Vitals:  Filed Vitals:   04/14/15 1035  BP: 88/49  Pulse: 65  Temp:   Resp: 18    Complications: No apparent anesthesia complications

## 2015-04-14 NOTE — OR Nursing (Signed)
Wasted 0.5mg  Dilaudid in sink. Witnessed by Dahlia Bailiff RN
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
## 2015-04-14 NOTE — Anesthesia Preprocedure Evaluation (Addendum)
Anesthesia Evaluation  Patient identified by MRN, date of birth, ID band Patient awake    Reviewed: Allergy & Precautions, NPO status , Patient's Chart, lab work & pertinent test results  History of Anesthesia Complications Negative for: history of anesthetic complications  Airway Mallampati: II  TM Distance: >3 FB Neck ROM: Full    Dental  (+) Missing, Dental Advisory Given   Pulmonary Current Smoker,    breath sounds clear to auscultation       Cardiovascular negative cardio ROS   Rhythm:Regular Rate:Normal     Neuro/Psych negative neurological ROS     GI/Hepatic negative GI ROS, Neg liver ROS,   Endo/Other  Morbid obesity  Renal/GU negative Renal ROS     Musculoskeletal   Abdominal (+) + obese,   Peds  Hematology negative hematology ROS (+)   Anesthesia Other Findings   Reproductive/Obstetrics                            Anesthesia Physical Anesthesia Plan  ASA: II  Anesthesia Plan: General   Post-op Pain Management: GA combined w/ Regional for post-op pain   Induction: Intravenous  Airway Management Planned: LMA  Additional Equipment:   Intra-op Plan:   Post-operative Plan:   Informed Consent: I have reviewed the patients History and Physical, chart, labs and discussed the procedure including the risks, benefits and alternatives for the proposed anesthesia with the patient or authorized representative who has indicated his/her understanding and acceptance.   Dental advisory given  Plan Discussed with: CRNA and Surgeon  Anesthesia Plan Comments: (Plan routine monitors, GA- LMA OK, supraclavicular block for post op analgesia)        Anesthesia Quick Evaluation

## 2015-04-14 NOTE — Op Note (Signed)
Test test  Preop diagnosis: left ulnar shaft fracture with distal the ulnocarpal ligament injury  Postop diagnosis: Same  Procedure: Open reduction internal fixation left ulnar shaft fracture.  Surgeon: Annell Greening M.D.  Asst.: Zonia Kief PA-C medically necessary and present for the procedure in its entirety.  Anesthesia: Preoperative block plus general  Tourniquet: 23 minutes.  Procedure 49 year old female had ulnar shaft fracture with persistent significant pain at the distal radial carpal joint with the pain hypermobility. She's been noncompliant had removed her sugar tong splint and just was in a soft wrap when she presented today. She was referred to me for the fracture with persistent wrist pain and and a concern for ulnocarpal ligamentous injury involvement of the distal the radial ulnar joint.  Patient was prepped and draped with proximal arm tourniquet after preoperative block and the induction of anesthesia with airway the control. DuraPrep was used the clindamycin was given to the patient IV do the and a silicone allergy. Timeout procedure was completed x-rays were visualized on the large the screens in the operating room. Sterile skin marker was used arm is wrapped with an Esmarch tourniquet inflated. Incision was made over the subcutaneous ulna starting proximal and distal and then proceeding to the fracture site. Hematoma was evacuated irrigated fracture was reduced held with self-retaining clamp 7 hole one third tubular Synthes nonlocking plate was selected placed slightly on the volar surface of the ulna clamp down and then the 3 neutral screws were placed distal. The closest screw to the fracture using the gold the drill guide was used for compression technique the second screw was placed just proximal this loosening the first compression screw the second one the increased compression at the fracture site. The screw adjacent to the fracture which was first use was then tightened  down in the last the screws were filled with neutral green guide drilling. All screws were 14-16 mm in length. Spot pictures were taken and it appeared that there was room at the fracture site for filling the final screw hole and so a total of 7 screws were placed. Last screw was neutral screw. Irrigation tourniquet deflation hemostasis was good. 20 Vicryls placed in the periosteum subtendinous tissue staple closure of the skin postop dressing and short-arm splint was applied. Patient tolerated the procedure well. Signed Annell Greening M.D.

## 2015-04-14 NOTE — Interval H&P Note (Signed)
History and Physical Interval Note:  04/14/2015 11:33 AM  Lauren Boyle  has presented today for surgery, with the diagnosis of Left Ulna Fracture  The various methods of treatment have been discussed with the patient and family. After consideration of risks, benefits and other options for treatment, the patient has consented to  Procedure(s): OPEN REDUCTION INTERNAL FIXATION (ORIF) ULNAR FRACTURE (Left) as a surgical intervention .  The patient's history has been reviewed, patient examined, no change in status, stable for surgery.  I have reviewed the patient's chart and labs.  Questions were answered to the patient's satisfaction.     YATES,MARK C

## 2015-04-15 ENCOUNTER — Encounter (HOSPITAL_COMMUNITY): Payer: Self-pay | Admitting: Orthopaedic Surgery

## 2016-03-07 ENCOUNTER — Ambulatory Visit (HOSPITAL_COMMUNITY)
Admission: EM | Admit: 2016-03-07 | Discharge: 2016-03-07 | Disposition: A | Discharge status: Home / Self Care | Payer: Managed Care, Other (non HMO) | Attending: Family Medicine | Admitting: Family Medicine

## 2016-03-07 ENCOUNTER — Encounter (HOSPITAL_COMMUNITY): Payer: Self-pay | Admitting: Family Medicine

## 2016-03-07 ENCOUNTER — Ambulatory Visit (INDEPENDENT_AMBULATORY_CARE_PROVIDER_SITE_OTHER): Payer: Managed Care, Other (non HMO)

## 2016-03-07 DIAGNOSIS — R058 Other specified cough: Secondary | ICD-10-CM

## 2016-03-07 DIAGNOSIS — R05 Cough: Secondary | ICD-10-CM | POA: Diagnosis not present

## 2016-03-07 DIAGNOSIS — Z91048 Other nonmedicinal substance allergy status: Secondary | ICD-10-CM

## 2016-03-07 DIAGNOSIS — Z9109 Other allergy status, other than to drugs and biological substances: Secondary | ICD-10-CM

## 2016-03-07 MED ORDER — CETIRIZINE HCL 10 MG PO TABS: 10.0000 mg | ORAL_TABLET | Freq: Every day | ORAL | 0 refills | Status: DC

## 2016-03-07 MED ORDER — BENZONATATE 100 MG PO CAPS: 100.0000 mg | ORAL_CAPSULE | Freq: Three times a day (TID) | ORAL | 0 refills | Status: DC

## 2016-03-07 MED ORDER — HYDROCODONE-HOMATROPINE 5-1.5 MG/5ML PO SYRP: 5.0000 mL | ORAL_SOLUTION | Freq: Every evening | ORAL | 0 refills | Status: DC | PRN

## 2016-03-07 NOTE — Discharge Instructions (Signed)
I believe the cough is due to allergens/irritants in your environment. To help, we should protect your airways from the chemicals around you at work with a mask. We should also try zyrtec daily to treat possible environmental allergies. You do not need any antibiotics at this time. Your chest xray was normal.   If your symptoms worsen you should return here for care. Eitherway, you need to establish care with a primary care physician and continue efforts to STOP smoking.

## 2016-03-07 NOTE — ED Triage Notes (Signed)
PT has worked in the "[employer]" in a [employer] since 2010. Over the past few weeks PT has developed "constant drainage" and a productive cough. PT began having chest soreness after she developed the cough. PT reports cough was initially productive of brown sputum, but is now producing white sputum.

## 2016-03-07 NOTE — ED Provider Notes (Signed)
[facility]    CSN: [id number] Arrival date & time: 03/07/16  1201  First Provider Contact:  First MD Initiated Contact with Patient 03/07/16 1209      History   Chief Complaint Chief Complaint  Patient presents with  . Cough    HPI Lauren Boyle is a 50 y.o. female who works for a [employer] company reporting cough for 6-8 weeks. She reports recent worsening of chronic daily cough that is productive of white phlegm. She also has post nasal drip and throat irritation during this time. The cough interrupts sleep and appears to be worse after work (2nd shift M-F), better on weekends. She's tried many OTC therapies including lozenges, mucinex, DayQuil, NyQuil. Never tried zyrtec, no h/o allergies. No Dx asthma, smokes about 1 pack per week. Denies fevers, chills, malaise, myalgias, arthralgias, rash, sick contacts. No wheezing, dyspnea. + left sided chest pain with coughing, becoming worse with picking things up with her left arm, radiating to left shoulder. Not worse with exertion or necessarily relieved by rest. No h/o ACS, HTN, HLD, or FH of CVD.   HPI  Past Medical History:  Diagnosis Date  . Iron deficiency     Patient Active Problem List   Diagnosis Date Noted  . Adrenal mass, right (HCC) 05/27/2014  . Low back pain 07/04/2013    Past Surgical History:  Procedure Laterality Date  . ABDOMINAL HYSTERECTOMY     partial  . COLONOSCOPY    . ORIF ULNAR FRACTURE Left 04/14/2015   Procedure: OPEN REDUCTION INTERNAL FIXATION (ORIF) ULNAR FRACTURE;  Surgeon: Eldred MangesMark C Yates, MD;  Location: [facility];  Service: Orthopedics;  Laterality: Left;    OB History    No data available       Home Medications    Prior to Admission medications   Medication Sig Start Date End Date Taking? Authorizing Provider  benzonatate (TESSALON) 100 MG capsule Take 1 capsule (100 mg total) by mouth every 8 (eight) hours. 03/07/16   Tyrone Nineyan B Miliana Gangwer, MD  cetirizine (ZYRTEC) 10 MG tablet Take  1 tablet (10 mg total) by mouth daily. 03/07/16   Tyrone Nineyan B Korynn Kenedy, MD  HYDROcodone-homatropine [facility](HYCODAN) 5-1.5 MG/5ML syrup Take 5 mLs by mouth at bedtime as needed for cough. 03/07/16   Tyrone Nineyan B Paysley Poplar, MD    Family History Family History  Problem Relation Age of Onset  . Hyperlipidemia Mother   . Hypertension Mother   . Hypertension Father   . Hyperlipidemia Father   . Diabetes Father   . Cancer Sister     ovarian  . Heart attack Neg Hx   . Sudden death Neg Hx     Social History Social History  Substance Use Topics  . Smoking status: Current Every Day Smoker    Packs/day: 0.25    Types: Cigarettes  . Smokeless tobacco: Never Used  . Alcohol use 4.2 oz/week    7 Glasses of wine per week     Allergies   Penicillins   Review of Systems Review of Systems As above  Physical Exam Triage Vital Signs ED Triage Vitals  Enc Vitals Group     BP 03/07/16 1213 126/68     Pulse Rate 03/07/16 1213 66     Resp 03/07/16 1213 12     Temp 03/07/16 1213 98.2 F (36.8 C)     Temp Source 03/07/16 1213 Oral     SpO2 03/07/16 1213 97 %     Weight --  Height --      Head Circumference --      Peak Flow --      Pain Score 03/07/16 1221 7     Pain Loc --      Pain Edu? --      Excl. in GC? --    No data found.   Updated Vital Signs BP 126/68 (BP Location: Right Arm)   Pulse 66   Temp 98.2 F (36.8 C) (Oral)   Resp 12   SpO2 97%   Visual Acuity Right Eye Distance:   Left Eye Distance:   Bilateral Distance:    Right Eye Near:   Left Eye Near:    Bilateral Near:     Physical Exam  Constitutional: She is oriented to person, place, and time. She appears well-developed and well-nourished. No distress.  HENT:  Right Ear: External ear normal.  Left Ear: External ear normal.  +PND down oropharynx, erythematous without enlarged tonsils. + injected turbinates.   Eyes: Conjunctivae and EOM are normal. Pupils are equal, round, and reactive to light. No scleral icterus.    Neck: Neck supple. No JVD present.  Cardiovascular: Normal rate, regular rhythm, normal heart sounds and intact distal pulses.   No murmur heard. Pulmonary/Chest: Effort normal and breath sounds normal. No stridor. No respiratory distress. She has no wheezes. She has no rales. She exhibits no tenderness.  Abdominal: Soft. Bowel sounds are normal. She exhibits no distension. There is no tenderness.  Lymphadenopathy:    She has no cervical adenopathy.  Neurological: She is alert and oriented to person, place, and time. She exhibits normal muscle tone.  Skin: Skin is warm and dry. Capillary refill takes less than 2 seconds. No rash noted.  Psychiatric: She has a normal mood and affect. Her behavior is normal.  Vitals reviewed.  UC Treatments / Results  Labs (all labs ordered are listed, but only abnormal results are displayed) Labs Reviewed - No data to display  EKG  EKG Interpretation None      Radiology No results found. 2view CXR reviewed by myself. My interpretation is normal without cardiopulmonary disease.   Procedures Procedures (including critical care time)  Medications Ordered in UC Medications - No data to display   Initial Impression / Assessment and Plan / UC Course  I have reviewed the triage vital signs and the nursing notes.  Pertinent labs & imaging results that were available during my care of the patient were reviewed by me and considered in my medical decision making (see chart for details).  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 No indication of bacterial infection, time frame not consistent with viral URI. No evidence of asthma, PE, ACS (chest pain is atypical and reproducible on palpation). CXR wnl. Suspect irritant from work vs. environmental allergy.  - Note written for trial of chemical safety mask  while at work - Trial zyrtec daily - Tessalon prn, hycodan syrup qHS prn - Other supportive therapies reviewed.  - Smoking cessation counseling provided.   New Prescriptions New Prescriptions   BENZONATATE (TESSALON) 100 MG CAPSULE    Take 1 capsule (100 mg total) by mouth every 8 (eight) hours.   CETIRIZINE (ZYRTEC) 10 MG TABLET    Take 1 tablet (10 mg total) by mouth daily.   HYDROCODONE-HOMATROPINE (HYCODAN) 5-1.5 MG/5ML SYRUP  Take 5 mLs by mouth at bedtime as needed for cough.     Tyrone Nine, MD 03/07/16 [phone]

## 2016-03-07 NOTE — ED Notes (Signed)
PT discharged by Dr. Grunz 

## 2016-03-09 ENCOUNTER — Encounter (HOSPITAL_COMMUNITY): Payer: Self-pay | Admitting: Emergency Medicine

## 2016-03-09 ENCOUNTER — Other Ambulatory Visit: Payer: Self-pay

## 2016-03-09 ENCOUNTER — Emergency Department (HOSPITAL_COMMUNITY): Payer: Managed Care, Other (non HMO)

## 2016-03-09 DIAGNOSIS — R220 Localized swelling, mass and lump, head: Secondary | ICD-10-CM | POA: Insufficient documentation

## 2016-03-09 DIAGNOSIS — F17210 Nicotine dependence, cigarettes, uncomplicated: Secondary | ICD-10-CM | POA: Insufficient documentation

## 2016-03-09 DIAGNOSIS — J029 Acute pharyngitis, unspecified: Secondary | ICD-10-CM | POA: Diagnosis present

## 2016-03-09 DIAGNOSIS — R221 Localized swelling, mass and lump, neck: Secondary | ICD-10-CM | POA: Diagnosis not present

## 2016-03-09 DIAGNOSIS — R0789 Other chest pain: Secondary | ICD-10-CM | POA: Diagnosis not present

## 2016-03-09 LAB — CBC
HCT: 36 % (ref 36.0–46.0)
Hemoglobin: 11.6 g/dL — ABNORMAL LOW (ref 12.0–15.0)
MCH: 28 pg (ref 26.0–34.0)
MCHC: 32.2 g/dL (ref 30.0–36.0)
MCV: 86.7 fL (ref 78.0–100.0)
Platelets: 368 10*3/uL (ref 150–400)
RBC: 4.15 MIL/uL (ref 3.87–5.11)
RDW: 14 % (ref 11.5–15.5)
WBC: 10 10*3/uL (ref 4.0–10.5)

## 2016-03-09 LAB — BASIC METABOLIC PANEL
ANION GAP: 8 (ref 5–15)
BUN: 13 mg/dL (ref 6–20)
CO2: 25 mmol/L (ref 22–32)
Calcium: 9.3 mg/dL (ref 8.9–10.3)
Chloride: 103 mmol/L (ref 101–111)
Creatinine, Ser: 0.76 mg/dL (ref 0.44–1.00)
GFR calc non Af Amer: >60 mL/min (ref >60)
Glucose, Bld: 93 mg/dL (ref 65–99)
POTASSIUM: 3.8 mmol/L (ref 3.5–5.1)
SODIUM: 136 mmol/L (ref 135–145)

## 2016-03-09 LAB — I-STAT TROPONIN, ED: Troponin i, poc: 0 ng/mL (ref 0.00–0.08)

## 2016-03-09 LAB — RAPID STREP SCREEN (MED CTR MEBANE ONLY): STREPTOCOCCUS, GROUP A SCREEN (DIRECT): NEGATIVE

## 2016-03-09 NOTE — ED Triage Notes (Signed)
Pt [facility] to ED [facility]/throat [facility] and CP.  Pt sts she was treated and things seemed to have gotten a little better, but pt sts she returned to work today and began having irritation to her throat and continued swelling (pain with palpation to neck and upper chest).  Pt c/o SOB and CP at this time.

## 2016-03-10 ENCOUNTER — Encounter (HOSPITAL_COMMUNITY): Payer: Self-pay | Admitting: Radiology

## 2016-03-10 ENCOUNTER — Emergency Department (HOSPITAL_COMMUNITY): Payer: Managed Care, Other (non HMO)

## 2016-03-10 ENCOUNTER — Emergency Department (HOSPITAL_COMMUNITY)
Admission: EM | Admit: 2016-03-10 | Discharge: 2016-03-10 | Disposition: A | Discharge status: Home / Self Care | Payer: Managed Care, Other (non HMO) | Attending: Emergency Medicine | Admitting: Emergency Medicine

## 2016-03-10 ENCOUNTER — Telehealth: Payer: Self-pay | Admitting: *Deleted

## 2016-03-10 DIAGNOSIS — J029 Acute pharyngitis, unspecified: Secondary | ICD-10-CM

## 2016-03-10 MED ORDER — IOPAMIDOL (ISOVUE-300) INJECTION 61%
INTRAVENOUS | Status: AC
  Administered 2016-03-10: 75 mL
  Filled 2016-03-10: qty 75

## 2016-03-10 MED ORDER — CLINDAMYCIN HCL 150 MG PO CAPS: 300.0000 mg | ORAL_CAPSULE | Freq: Four times a day (QID) | ORAL | 0 refills | Status: DC

## 2016-03-10 MED ORDER — DEXAMETHASONE SODIUM PHOSPHATE 10 MG/ML IJ SOLN
10.0000 mg | Freq: Once | INTRAMUSCULAR | Status: AC
  Administered 2016-03-10: 10 mg via INTRAVENOUS
  Filled 2016-03-10: qty 1

## 2016-03-10 MED ORDER — DEXAMETHASONE SODIUM PHOSPHATE 10 MG/ML IJ SOLN: 10.0000 mg | Freq: Once | INTRAMUSCULAR | Status: DC

## 2016-03-10 NOTE — ED Provider Notes (Signed)
[facility]   CSN: [id number] Arrival date & time: 03/09/16  2138     History   Chief Complaint Chief Complaint  Patient presents with  . Neck Pain  . Facial Swelling  . Chest Pain    HPI Lauren Boyle is a 50 y.o. female.  Patient with no significant past medical history presents with complaint of sore throat, neck swelling, voice change, and facial swelling occurring over the past 4 days. Patient was seen at urgent care and started on an antihistamine. Patient has not had any associated fevers, runny nose. She states that she feels a pressure in her neck when she swallows. She also states that her face feels swollen and her coworkers have noted that she looks different. No difficulty breathing. Patient notes tenderness to palpation of her anterior neck, worse on the left. 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 At previous visit, thought that she might have some irritation from inhalation of chemicals given that she has had cough for several weeks. No history of diabetes or immunocompromise. Prescribed and OTC medications have not helped her symptoms.      Past Medical History:  Diagnosis Date  . Iron deficiency     Patient Active Problem List   Diagnosis Date Noted  . Adrenal mass, right (HCC) 05/27/2014  . Low back pain 07/04/2013    Past Surgical History:  Procedure Laterality Date  . ABDOMINAL HYSTERECTOMY     partial  . COLONOSCOPY    . ORIF ULNAR FRACTURE Left 04/14/2015   Procedure: OPEN REDUCTION INTERNAL FIXATION (ORIF) ULNAR FRACTURE;  Surgeon: Eldred Manges, MD;  Location: [facility];  Service: Orthopedics;  Laterality: Left;    OB History    No data available       Home Medications    Prior to Admission medications   Medication Sig Start Date End Date Taking? Authorizing Provider  benzonatate (TESSALON) 100 MG capsule Take 1 capsule (100 mg total) by mouth every 8 (eight) hours. 03/07/16  Yes Tyrone Nine, MD  cetirizine (ZYRTEC) 10 MG tablet Take 1 tablet (10 mg total) by mouth daily. 03/07/16  Yes Tyrone Nine, MD  HYDROcodone-homatropine [facility]) 5-1.5 MG/5ML syrup Take 5 mLs by mouth at bedtime as needed for cough. 03/07/16  Yes Tyrone Nine, MD    Family History Family History  Problem Relation Age of Onset  . Hyperlipidemia Mother   . Hypertension Mother   . Hypertension Father   . Hyperlipidemia Father   . Diabetes Father   . Cancer Sister     ovarian  . Heart attack Neg Hx   . Sudden death Neg Hx     Social History Social History  Substance Use Topics  . Smoking status: Current Every Day Smoker    Packs/day: 0.25    Types: Cigarettes  . Smokeless tobacco: Never Used  . Alcohol use 4.2 oz/week    7 Glasses of wine per week     Allergies   Penicillins   Review of Systems Review of Systems  Constitutional: Positive for chills and fever (subjective). Negative for unexpected weight change.  HENT: Positive for facial swelling, sore throat, trouble swallowing and voice change. Negative for rhinorrhea.   Eyes: Negative for redness.  Respiratory: Positive for cough. Negative for shortness of breath.   Cardiovascular: Positive for chest pain.  Gastrointestinal: Negative for abdominal pain, diarrhea, nausea and vomiting.  Genitourinary: Negative for dysuria.  Musculoskeletal: Negative for myalgias.  Skin: Negative for rash.  Neurological: Negative for headaches.     Physical Exam Updated Vital Signs BP 129/79   Pulse (!) 54   Temp 97.8 F (36.6 C) (Oral)   Resp 12   Ht 5\' 5"  (1.651 m)   Wt 83 kg   SpO2 98%   BMI 30.45 kg/m   Physical Exam  Constitutional: She appears well-developed and well-nourished.  HENT:  Head: Normocephalic and atraumatic.  Mouth/Throat: Oropharynx is clear and moist.  Eyes: Conjunctivae are normal. Right eye exhibits no discharge. Left eye exhibits no discharge.  Neck: Normal range of motion. Neck supple. No thyromegaly  present.  Fullness of the bilateral anterior soft tissues of the neck. Patient has palpable swelling, question lymph node, of the left anterior chain.  Cardiovascular: Normal rate, regular rhythm and normal heart sounds.  Exam reveals no friction rub.   No murmur heard. Pulmonary/Chest: Effort normal and breath sounds normal. She exhibits tenderness (Left-sided chest wall tenderness without mass).  Abdominal: Soft. There is no tenderness.  Lymphadenopathy:    She has cervical adenopathy.  Neurological: She is alert.  Skin: Skin is warm and dry.  Psychiatric: She has a normal mood and affect.  Nursing note and vitals reviewed.    ED Treatments / Results  Labs (all labs ordered are listed, but only abnormal results are displayed) Labs Reviewed  CBC - Abnormal; Notable for the following:       Result Value   Hemoglobin 11.6 (*)    All other components within normal limits  RAPID STREP SCREEN (NOT AT [facility])  CULTURE, GROUP A STREP [facility])  BASIC METABOLIC PANEL  I-STAT TROPOININ, ED    EKG  EKG Interpretation  Date/Time:  Wednesday March 10 2016 04:47:45 EDT Ventricular Rate:  57 PR Interval:    QRS Duration: 100 QT Interval:  447 QTC Calculation: 436 R Axis:   34 Text Interpretation:  Sinus rhythm Ventricular premature complex Probable left ventricular hypertrophy Confirmed by Wilkie AyeHORTON  MD, Toni AmendOURTNEY ([id number]) on 03/10/2016 4:55:52 AM       Radiology Dg Chest 2 View  Result Date: 03/09/2016 CLINICAL DATA:  SOB, "allergic reaction", CP with inspiration. EXAM: CHEST  2 VIEW COMPARISON:  03/07/2016 FINDINGS: The heart size and mediastinal contours are within normal limits. Both lungs are clear. The visualized skeletal structures are unremarkable. IMPRESSION: No active cardiopulmonary disease. Electronically Signed   By: Burman NievesWilliam  Stevens M.D.   On: 03/09/2016 23:09   Ct Soft Tissue Neck W Contrast  Result Date: 03/10/2016 CLINICAL DATA:  50 y/o F; swelling of the neck in throat  with chest pain. EXAM: CT NECK WITH CONTRAST TECHNIQUE: Multidetector CT imaging of the neck was performed using the standard protocol following the bolus administration of intravenous contrast. CONTRAST:  75mL ISOVUE-300 IOPAMIDOL (ISOVUE-300) INJECTION 61% COMPARISON:  None. FINDINGS: Pharynx and larynx: There is oropharyngeal mucosal thickening in enlargement of the lingual and palatine tonsils likely representing an infectious/inflammatory pharyngitis. No discrete fluid collection or abscess is identified. No prevertebral fluid collection is identified. The glottis and subglottic airway are normal. Salivary glands: Normal salivary glands. 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 The right thyroid lobe and isthmus are unremarkable. Lymph nodes: There are multiple enlarged cervical lymph nodes in the left-greater-than-right anterior cervical chains measuring up to15 x 21 mm at the left level IIa station, series 4, image 44. Vascular: Carotid and vertebral arteries of the neck are widely patent. Limited intracranial:  No acute intracranial abnormality is identified. Visualized orbits: Unremarkable. Mastoids and visualized paranasal sinuses: Partial opacification of the ethmoid air cells and mild mucosal thickening in right greater than left maxillary sinuses. 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 There is moderate narrowing of the right maxillary antrostomy due to mucosal thickening. The left ostiomeatal unit is opacified. Mastoid air cells are normally pneumatized. Skeleton: No acute bony or articular abnormality is evident. Upper chest: No acute pulmonary process. Minor dependent atelectasis. No upper mediastinal lymphadenopathy. IMPRESSION: 1. Oropharyngeal mucosal thickening and tonsil enlargement likely represents an infectious/inflammatory pharyngitis. No abscess is identified. No inflammatory  infiltration or collection in the parapharyngeal fat for the prevertebral/retropharyngeal space. 2. Left greater than right anterior cervical chain lymphadenopathy is likely reactive. Electronically Signed   By: Mitzi HansenLance  Furusawa-Stratton M.D.   On: 03/10/2016 06:07    Procedures Procedures (including critical care time)  Medications Ordered in ED Medications - No data to display   Initial Impression / Assessment and Plan / ED Course  I have reviewed the triage vital signs and the nursing notes.  Pertinent labs & imaging results that were available during my care of the patient were reviewed by me and considered in my medical decision making (see chart for details).  Clinical Course  Comment By Time  Patient seen and examined. CT of neck ordered. Patient's symptoms are not typical for laryngitis. Given reported neck and facial swelling, will rule out deep space infection or mass. Patient in agreement with plan. Renne CriglerJoshua Bairon Klemann, PA-C 08/16 0507  Discussed CT findings with Dr. Harrie JeansStratton radiology. Patient has dilated jugular veins with venous mixing but no clot noted in jugulars.   Patient informed of results. Will treat with clindamycin and IM Decadron. Patient given a work note. She will continue OTC meds for pain. Encourage return with worsening symptoms, high persistent fever, difficulty breathing or swallowing, or other concerns. She should anticipate gradual improvement over the next several days. Renne CriglerJoshua Padraic Marinos, [place]-C 08/16 [phone]     Final Clinical Impressions(s) / ED Diagnoses   Final diagnoses:  Pharyngitis   Patient with neck swelling, facial swelling. CT suggestive of pharyngitis with reactive lymphadenopathy. No deep space infection or mass. Treatment as above. Patient appears well, discussed return instructions as above. She appears well, nontoxic.  New Prescriptions New Prescriptions   CLINDAMYCIN (CLEOCIN) 150 MG CAPSULE    Take 2 capsules (300 mg total) by mouth every 6 (six)  hours.     Renne CriglerJoshua Nicasio Barlowe, PA-C 03/10/16 [phone]    Shon Batonourtney F Horton, MD 03/10/16 [phone]

## 2016-03-10 NOTE — ED Notes (Signed)
EDP at bedside  

## 2016-03-10 NOTE — Discharge Instructions (Signed)
Please read and follow all provided instructions.  Your diagnoses today include:  1. Pharyngitis     Tests performed today include:  Strep test: was negative for strep throat  Strep culture: you will be notified if this comes back positive  CT scan of your neck - shows swollen lymph nodes and pharyngitis infection  Vital signs. See below for your results today.   Medications prescribed:   Clindamycin - antibiotic  You have been prescribed an antibiotic medicine: take the entire course of medicine even if you are feeling better. Stopping early can cause the antibiotic not to work.  Home care instructions:  Please read the educational materials provided and follow any instructions contained in this packet.  Follow-up instructions: Please follow-up with your primary care provider as needed for further evaluation of your symptoms.  Return instructions:   Please return to the Emergency Department if you experience worsening symptoms.   Return if you have worsening problems swallowing, your neck becomes swollen, you cannot swallow your saliva or your voice becomes muffled.   Return with high persistent fever, persistent vomiting, or if you have trouble breathing.   Please return if you have any other emergent concerns.  Additional Information:  Your vital signs today were: BP 118/92    Pulse (!) 56    Temp 97.8 F (36.6 C) (Oral)    Resp 12    Ht 5\' 5"  (1.651 m)    Wt 83 kg    SpO2 100%    BMI 30.45 kg/m  If your blood pressure (BP) was elevated above 135/85 this visit, please have this repeated by your doctor within one month. --------------

## 2016-03-11 LAB — CULTURE, GROUP A STREP (THRC)

## 2016-05-30 ENCOUNTER — Encounter (HOSPITAL_BASED_OUTPATIENT_CLINIC_OR_DEPARTMENT_OTHER): Payer: Self-pay | Admitting: Emergency Medicine

## 2016-05-30 ENCOUNTER — Emergency Department (HOSPITAL_BASED_OUTPATIENT_CLINIC_OR_DEPARTMENT_OTHER)
Admission: EM | Admit: 2016-05-30 | Discharge: 2016-05-30 | Disposition: A | Discharge status: Home / Self Care | Payer: Managed Care, Other (non HMO) | Attending: Emergency Medicine | Admitting: Emergency Medicine

## 2016-05-30 ENCOUNTER — Emergency Department (HOSPITAL_BASED_OUTPATIENT_CLINIC_OR_DEPARTMENT_OTHER): Payer: Managed Care, Other (non HMO)

## 2016-05-30 DIAGNOSIS — W1839XA Other fall on same level, initial encounter: Secondary | ICD-10-CM | POA: Insufficient documentation

## 2016-05-30 DIAGNOSIS — F17210 Nicotine dependence, cigarettes, uncomplicated: Secondary | ICD-10-CM | POA: Insufficient documentation

## 2016-05-30 DIAGNOSIS — S8992XA Unspecified injury of left lower leg, initial encounter: Secondary | ICD-10-CM | POA: Diagnosis present

## 2016-05-30 DIAGNOSIS — Y9301 Activity, walking, marching and hiking: Secondary | ICD-10-CM | POA: Insufficient documentation

## 2016-05-30 DIAGNOSIS — Y999 Unspecified external cause status: Secondary | ICD-10-CM | POA: Insufficient documentation

## 2016-05-30 DIAGNOSIS — Y929 Unspecified place or not applicable: Secondary | ICD-10-CM | POA: Insufficient documentation

## 2016-05-30 DIAGNOSIS — M25462 Effusion, left knee: Secondary | ICD-10-CM | POA: Insufficient documentation

## 2016-05-30 LAB — CBC
HCT: 37.4 % (ref 36.0–46.0)
Hemoglobin: 12.2 g/dL (ref 12.0–15.0)
MCH: 27.8 pg (ref 26.0–34.0)
MCHC: 32.6 g/dL (ref 30.0–36.0)
MCV: 85.2 fL (ref 78.0–100.0)
PLATELETS: 408 10*3/uL — AB (ref 150–400)
RBC: 4.39 MIL/uL (ref 3.87–5.11)
RDW: 13.9 % (ref 11.5–15.5)
WBC: 7.4 10*3/uL (ref 4.0–10.5)

## 2016-05-30 LAB — COMPREHENSIVE METABOLIC PANEL
ALBUMIN: 3.8 g/dL (ref 3.5–5.0)
ALK PHOS: 63 U/L (ref 38–126)
ALT: 13 U/L — AB (ref 14–54)
AST: 17 U/L (ref 15–41)
Anion gap: 6 (ref 5–15)
BUN: 10 mg/dL (ref 6–20)
CALCIUM: 9.3 mg/dL (ref 8.9–10.3)
CHLORIDE: 110 mmol/L (ref 101–111)
CO2: 25 mmol/L (ref 22–32)
CREATININE: 0.58 mg/dL (ref 0.44–1.00)
GFR calc Af Amer: >60 mL/min (ref >60)
GFR calc non Af Amer: >60 mL/min (ref >60)
GLUCOSE: 90 mg/dL (ref 65–99)
Potassium: 3.7 mmol/L (ref 3.5–5.1)
SODIUM: 141 mmol/L (ref 135–145)
Total Bilirubin: 0.2 mg/dL — ABNORMAL LOW (ref 0.3–1.2)
Total Protein: 7.2 g/dL (ref 6.5–8.1)

## 2016-05-30 LAB — ACETAMINOPHEN LEVEL

## 2016-05-30 MED ORDER — KETOROLAC TROMETHAMINE 15 MG/ML IJ SOLN
15.0000 mg | Freq: Once | INTRAMUSCULAR | Status: AC
  Administered 2016-05-30: 15 mg via INTRAVENOUS
  Filled 2016-05-30: qty 1

## 2016-05-30 NOTE — ED Provider Notes (Signed)
[facility] Provider Note   CSN: [id number] Arrival date & time: 05/30/16  1500  By signing my name below, I, Arianna Nassar, attest that this documentation has been prepared under the direction and in the presence of Nira ConnPedro Eduardo Cardama, MD.  Electronically Signed: Octavia HeirArianna Nassar, ED Scribe. 05/30/16. 3:43 PM.    History   Chief Complaint Chief Complaint  Patient presents with  . Knee Pain   The history is provided by the patient. No language interpreter was used.   HPI Comments: Lauren SinkStephanie Bogdanski is a 50 y.o. female who presents to the Emergency Department complaining of gradual worsening left knee pain s/p a fall that occurred one month ago. Associated left knee swelling. She describes the pain as pressure-like. She states that she was walking her dog when her dog pulled her and landed on both of her knees to concrete. Pt was not evaluated after the fall nor has she worn a knee brace. Pt does a lot of standing at her job and reports over the past week her pain and swelling has gotten progressively worse. She notes not being able to bend her knee secondary to pain. Pt has been taking ibuprofen and 3-4, 500 mg of tylenol q.2-3h to alleviate her pain without relief. Denies fever, abdominal pain, nausea, vomiting, hx of gout, hx of arthritis, hx of DVT, hx of DVT, hx of hemophilia, or  hx of STD.   Past Medical History:  Diagnosis Date  . Iron deficiency     Patient Active Problem List   Diagnosis Date Noted  . Adrenal mass, right (HCC) 05/27/2014  . Low back pain 07/04/2013    Past Surgical History:  Procedure Laterality Date  . ABDOMINAL HYSTERECTOMY     partial  . COLONOSCOPY    . ORIF ULNAR FRACTURE Left 04/14/2015   Procedure: OPEN REDUCTION INTERNAL FIXATION (ORIF) ULNAR FRACTURE;  Surgeon: Eldred MangesMark C Yates, MD;  Location: [facility] OR;  Service: Orthopedics;  Laterality: Left;    OB History    No data available       Home Medications    Prior to Admission  medications   Medication Sig Start Date End Date Taking? Authorizing Provider  benzonatate (TESSALON) 100 MG capsule Take 1 capsule (100 mg total) by mouth every 8 (eight) hours. 03/07/16   Tyrone Nineyan B Grunz, MD  cetirizine (ZYRTEC) 10 MG tablet Take 1 tablet (10 mg total) by mouth daily. 03/07/16   Tyrone Nineyan B Grunz, MD  clindamycin (CLEOCIN) 150 MG capsule Take 2 capsules (300 mg total) by mouth every 6 (six) hours. 03/10/16   Renne CriglerJoshua Geiple, PA-C  HYDROcodone-homatropine (HYCODAN) 5-1.5 MG/5ML syrup Take 5 mLs by mouth at bedtime as needed for cough. 03/07/16   Tyrone Nineyan B Grunz, MD    Family History Family History  Problem Relation Age of Onset  . Hyperlipidemia Mother   . Hypertension Mother   . Hypertension Father   . Hyperlipidemia Father   . Diabetes Father   . Cancer Sister     ovarian  . Heart attack Neg Hx   . Sudden death Neg Hx     Social History Social History  Substance Use Topics  . Smoking status: Current Every Day Smoker    Packs/day: 0.25    Types: Cigarettes  . Smokeless tobacco: Never Used  . Alcohol use 4.2 oz/week    7 Glasses of wine per week     Allergies   Penicillins   Review of Systems Review of Systems  A  complete 10 system review of systems was obtained and all systems are negative except as noted in the HPI and PMH.   Physical Exam Updated Vital Signs BP (!) 129/102 (BP Location: Right Arm)   Pulse 70   Temp 98 F (36.7 C) (Oral)   Resp 22   Ht 5\' 5"  (1.651 m)   Wt 176 lb (79.8 kg)   SpO2 100%   BMI 29.29 kg/m   Physical Exam  Constitutional: She is oriented to person, place, and time. She appears well-developed and well-nourished. No distress.  HENT:  Head: Normocephalic and atraumatic.  Right Ear: External ear normal.  Left Ear: External ear normal.  Nose: Nose normal.  Eyes: Conjunctivae and EOM are normal. No scleral icterus.  Neck: Normal range of motion and phonation normal.  Cardiovascular: Normal rate and regular rhythm.     Pulmonary/Chest: Effort normal. No stridor. No respiratory distress.  Abdominal: She exhibits no distension.  Musculoskeletal: Normal range of motion. She exhibits edema and tenderness.  Left knee effusion pain on palpation of medial and lateral joint space, no laxity, no swelling distal to the knee, tenderness to palpation to upper tib/fib.  Neurological: She is alert and oriented to person, place, and time.  Skin: She is not diaphoretic.  Psychiatric: She has a normal mood and affect. Her behavior is normal.  Nursing note and vitals reviewed.    ED Treatments / Results  DIAGNOSTIC STUDIES: Oxygen Saturation is 100% on RA, normal by my interpretation.  COORDINATION OF CARE:  3:37 PM Will order x-ray of left knee, pain medication, and lab work. Discussed treatment plan which includes with pt at bedside and pt agreed to plan.  Labs (all labs ordered are listed, but only abnormal results are displayed) Labs Reviewed  COMPREHENSIVE METABOLIC PANEL - Abnormal; Notable for the following:       Result Value   ALT 13 (*)    Total Bilirubin 0.2 (*)    All other components within normal limits  CBC - Abnormal; Notable for the following:    Platelets 408 (*)    All other components within normal limits  ACETAMINOPHEN LEVEL - Abnormal; Notable for the following:    Acetaminophen (Tylenol), Serum <10 (*)    All other components within normal limits    EKG  EKG Interpretation None       Radiology Dg Knee Complete 4 Views Left  Result Date: 05/30/2016 CLINICAL DATA:  Initial evaluation for acute left knee pain, fall 1 month ago. EXAM: LEFT KNEE - COMPLETE 4+ VIEW COMPARISON:  None. FINDINGS: No acute fracture or dislocation. Joint effusion present within the suprapatellar recess. Mild degenerative osteoarthritic changes within the medial femorotibial and patellofemoral joint space compartments. No appreciable soft tissue swelling. Osseous mineralization normal. IMPRESSION: 1. No acute  fracture or dislocation. 2. Moderate joint effusion. 3. Mild degenerative osteoarthritic changes involving the medial femorotibial and patellofemoral joint space compartments. Electronically Signed   By: Rise MuBenjamin  McClintock M.D.   On: 05/30/2016 16:11    Procedures Procedures (including critical care time)  Medications Ordered in ED Medications  ketorolac (TORADOL) 15 MG/ML injection 15 mg (15 mg Intravenous Given 05/30/16 1608)     Initial Impression / Assessment and Plan / ED Course  I have reviewed the triage vital signs and the nursing notes.  Pertinent labs & imaging results that were available during my care of the patient were reviewed by me and considered in my medical decision making (see chart for details).  Clinical Course     Left knee effusion following mechanical fall last month. No evidence of fracture or dislocation. Low suspicion for DVT. Ortho follow up.  Screening labs w/o evidence of liver injury from increased Tylenol use. Given strict dosing instructions for tylenol.    The patient is safe for discharge with strict return precautions.  Final Clinical Impressions(s) / ED Diagnoses   Final diagnoses:  Effusion, left knee   I personally performed the services described in this documentation, which was scribed in my presence. The recorded information has been reviewed and is accurate.   Disposition: Discharge  Condition: Good  I have discussed the results, Dx and Tx plan with the patient who expressed understanding and agree(s) with the plan. Discharge instructions discussed at great length. The patient was given strict return precautions who verbalized understanding of the instructions. No further questions at time of discharge.    Current Discharge Medication List      Follow Up: Geryl Rankins, MD [address] [place] [address] [phone]  Schedule an appointment as soon as possible for a visit  As needed  Orthopedic  MD  Schedule an appointment as soon as possible for a visit  If symptoms do not improve or  worsen      Nira Conn, MD 05/30/16 1754

## 2016-05-30 NOTE — ED Triage Notes (Signed)
Pt in c/o L knee pain x 1 month after falling on it. Pt alert, interactive, ambulatory in NAD.

## 2016-05-30 NOTE — ED Notes (Signed)
Patient transported to Ultrasound 

## 2016-05-30 NOTE — ED Notes (Signed)
States " I was tripped up by my dog a month ago and fell on both my knees but my left knee has been swelling and feels like knots are under the skin" No obvious deformity but limited ROM noted to left knee

## 2016-05-30 NOTE — ED Notes (Signed)
Patient transported to X-ray 

## 2016-07-23 ENCOUNTER — Encounter (HOSPITAL_COMMUNITY): Payer: Self-pay | Admitting: *Deleted

## 2016-07-23 ENCOUNTER — Emergency Department (HOSPITAL_COMMUNITY)
Admission: EM | Admit: 2016-07-23 | Discharge: 2016-07-23 | Disposition: A | Discharge status: Home / Self Care | Payer: Managed Care, Other (non HMO) | Attending: Emergency Medicine | Admitting: Emergency Medicine

## 2016-07-23 ENCOUNTER — Emergency Department (HOSPITAL_COMMUNITY): Payer: Managed Care, Other (non HMO)

## 2016-07-23 DIAGNOSIS — F17210 Nicotine dependence, cigarettes, uncomplicated: Secondary | ICD-10-CM | POA: Diagnosis not present

## 2016-07-23 DIAGNOSIS — Y92007 Garden or yard of unspecified non-institutional (private) residence as the place of occurrence of the external cause: Secondary | ICD-10-CM | POA: Insufficient documentation

## 2016-07-23 DIAGNOSIS — S2020XA Contusion of thorax, unspecified, initial encounter: Secondary | ICD-10-CM | POA: Diagnosis not present

## 2016-07-23 DIAGNOSIS — Y9389 Activity, other specified: Secondary | ICD-10-CM | POA: Diagnosis not present

## 2016-07-23 DIAGNOSIS — S20212A Contusion of left front wall of thorax, initial encounter: Secondary | ICD-10-CM

## 2016-07-23 DIAGNOSIS — S6392XA Sprain of unspecified part of left wrist and hand, initial encounter: Secondary | ICD-10-CM | POA: Diagnosis not present

## 2016-07-23 DIAGNOSIS — S63502A Unspecified sprain of left wrist, initial encounter: Secondary | ICD-10-CM

## 2016-07-23 DIAGNOSIS — W1830XA Fall on same level, unspecified, initial encounter: Secondary | ICD-10-CM | POA: Insufficient documentation

## 2016-07-23 DIAGNOSIS — Z79899 Other long term (current) drug therapy: Secondary | ICD-10-CM | POA: Insufficient documentation

## 2016-07-23 DIAGNOSIS — S6992XA Unspecified injury of left wrist, hand and finger(s), initial encounter: Secondary | ICD-10-CM | POA: Diagnosis present

## 2016-07-23 DIAGNOSIS — Y999 Unspecified external cause status: Secondary | ICD-10-CM | POA: Insufficient documentation

## 2016-07-23 DIAGNOSIS — S8002XA Contusion of left knee, initial encounter: Secondary | ICD-10-CM

## 2016-07-23 DIAGNOSIS — W19XXXA Unspecified fall, initial encounter: Secondary | ICD-10-CM

## 2016-07-23 MED ORDER — NAPROXEN 500 MG PO TABS: 500.0000 mg | ORAL_TABLET | Freq: Two times a day (BID) | ORAL | 0 refills | Status: DC

## 2016-07-23 MED ORDER — TRAMADOL HCL 50 MG PO TABS: 50.0000 mg | ORAL_TABLET | Freq: Four times a day (QID) | ORAL | 0 refills | Status: DC | PRN

## 2016-07-23 NOTE — ED Triage Notes (Signed)
To ED for eval of left forearm pain, lower back pain, and left leg pain since falling in the yard this afternoon. Ambulatory without difficulty. Plates placed in left forearm last year. Pulses palpable.

## 2016-07-23 NOTE — ED Notes (Signed)
Pt stable, ambulatory, states understanding of discharge instructions 

## 2016-07-23 NOTE — ED Provider Notes (Signed)
[facility] Provider Note   CSN: [id number] Arrival date & time: 07/23/16  1625  By signing my name below, I, Modena JanskyAlbert Thayil, attest that this documentation has been prepared under the direction and in the presence of non-physician practitioner, Kerrie BuffaloHope Nicolasa Milbrath, NP. Electronically Signed: Modena JanskyAlbert Thayil, Scribe. 07/23/2016. 7:29 PM.  History   Chief Complaint Chief Complaint  Patient presents with  . Fall   The history is provided by the patient. No language interpreter was used.  Fall  This is a new problem. The current episode started 3 to 5 hours ago. The problem occurs constantly. The problem has not changed since onset.Pertinent negatives include no headaches. The symptoms are aggravated by bending. Nothing relieves the symptoms. She has tried nothing for the symptoms.   HPI Comments: Lauren Boyle is a 50 y.o. female who presents to the Emergency Department complaining of a fall that occurred about a few hours ago. She states she fell in her yard onto her extended LUE (which has surgical screws) while playing with her dogs. She denies any LOC or head injury. She reports associated symptoms of LUE pain, lower back pain, and LLE pain. She describes the pain as constant, moderate, and exacerbated by movement. She denies any other complaints.    PCP: Geryl RankinsVARNADO, EVELYN, MD  Past Medical History:  Diagnosis Date  . Iron deficiency     Patient Active Problem List   Diagnosis Date Noted  . Adrenal mass, right (HCC) 05/27/2014  . Low back pain 07/04/2013    Past Surgical History:  Procedure Laterality Date  . ABDOMINAL HYSTERECTOMY     partial  . COLONOSCOPY    . ORIF ULNAR FRACTURE Left 04/14/2015   Procedure: OPEN REDUCTION INTERNAL FIXATION (ORIF) ULNAR FRACTURE;  Surgeon: Eldred MangesMark C Yates, MD;  Location: [facility] OR;  Service: Orthopedics;  Laterality: Left;    OB History    No data available       Home Medications    Prior to Admission medications   Medication Sig Start  Date End Date Taking? Authorizing Provider  cetirizine (ZYRTEC) 10 MG tablet Take 1 tablet (10 mg total) by mouth daily. 03/07/16   Tyrone Nineyan B Grunz, MD  naproxen (NAPROSYN) 500 MG tablet Take 1 tablet (500 mg total) by mouth 2 (two) times daily. 07/23/16   Braulio Kiedrowski Orlene OchM Wells Gerdeman, NP  traMADol (ULTRAM) 50 MG tablet Take 1 tablet (50 mg total) by mouth every 6 (six) hours as needed. 07/23/16   Icey Tello Orlene OchM Jesua Tamblyn, NP    Family History Family History  Problem Relation Age of Onset  . Hyperlipidemia Mother   . Hypertension Mother   . Hypertension Father   . Hyperlipidemia Father   . Diabetes Father   . Cancer Sister     ovarian  . Heart attack Neg Hx   . Sudden death Neg Hx     Social History Social History  Substance Use Topics  . Smoking status: Current Every Day Smoker    Packs/day: 0.25    Types: Cigarettes  . Smokeless tobacco: Never Used  . Alcohol use 4.2 oz/week    7 Glasses of wine per week     Allergies   Penicillins   Review of Systems Review of Systems  Musculoskeletal: Positive for back pain and myalgias (LLE, LUE).  Skin: Negative for wound.  Neurological: Negative for syncope and headaches.  All other systems reviewed and are negative.    Physical Exam Updated Vital Signs BP 111/78 (BP Location: Right Arm)  Pulse 66   Temp 97.4 F (36.3 C) (Oral)   SpO2 100%   Physical Exam  Constitutional: She is oriented to person, place, and time. She appears well-developed and well-nourished. No distress.  HENT:  Head: Normocephalic and atraumatic.  Nose: Nose normal.  Mouth/Throat: Uvula is midline. No posterior oropharyngeal edema or posterior oropharyngeal erythema.  Eyes: Conjunctivae and EOM are normal. Pupils are equal, round, and reactive to light. No scleral icterus.  Neck: Normal range of motion. Neck supple.  Cardiovascular: Normal rate and regular rhythm.   Pulses:      Radial pulses are 2+ on the right side, and 2+ on the left side.  Pulmonary/Chest: Effort  normal. No respiratory distress. She has no wheezes. She has no rales.  Abdominal: Soft. There is no tenderness.  Musculoskeletal: Normal range of motion.       Left wrist: She exhibits tenderness and swelling. She exhibits no deformity and no laceration. Decreased range of motion: due to pain.       Left knee: She exhibits swelling. She exhibits no deformity, no laceration, no erythema and normal alignment. Decreased range of motion: due to pain. Tenderness found.  LUE: Left wrist is swollen. Adequate circulation. TTP. Full ROM of the elbow. TTP to the left forearm. Radial pulses 2+.   TTP to left ribs. TTP to left lumbar area. Swelling noted to the left knee. Muscular TTP to lateral aspect of left lower leg. Pedal pulses are +2. Strength is equal in BLE.   Neurological: She is alert and oriented to person, place, and time.  Skin: Skin is warm and dry.  Psychiatric: She has a normal mood and affect. Her behavior is normal.  Nursing note and vitals reviewed.    ED Treatments / Results  DIAGNOSTIC STUDIES: Oxygen Saturation is 100% on RA, normal by my interpretation.    COORDINATION OF CARE: 7:33 PM- Pt advised of plan for treatment and pt agrees.  Labs (all labs ordered are listed, but only abnormal results are displayed) Labs Reviewed - No data to display   Radiology Dg Ribs Unilateral W/chest Left  Result Date: 07/23/2016 CLINICAL DATA:  Status post fall from standing, with left arm pain. Initial encounter. EXAM: LEFT RIBS AND CHEST - 3+ VIEW COMPARISON:  Chest radiograph performed 03/09/2016 FINDINGS: No displaced rib fractures are seen. The lungs are well-aerated and clear. There is no evidence of focal opacification, pleural effusion or pneumothorax. The cardiomediastinal silhouette is within normal limits. No acute osseous abnormalities are seen. IMPRESSION: No displaced rib fracture seen. No acute cardiopulmonary process identified. Electronically Signed   By: Roanna Raider  M.D.   On: 07/23/2016 20:58   Dg Forearm Left  Result Date: 07/23/2016 CLINICAL DATA:  Acute onset of left forearm pain. Initial encounter. EXAM: LEFT FOREARM - 2 VIEW COMPARISON:  Left forearm radiographs performed 04/05/2015 FINDINGS: There is no evidence of fracture or dislocation. The left radius and ulna appear grossly intact. A plate and screws are seen along the mid ulna. The carpal rows appear grossly intact, and demonstrate normal alignment. The elbow joint is grossly unremarkable, with minimal underlying degenerative change. Mild dorsal soft tissue swelling is noted about the mid and distal forearm. IMPRESSION: No evidence of fracture or dislocation. Electronically Signed   By: Roanna Raider M.D.   On: 07/23/2016 20:49   Dg Wrist Complete Left  Result Date: 07/23/2016 CLINICAL DATA:  Status post fall, with left wrist pain. Initial encounter. EXAM: LEFT WRIST - COMPLETE 3+  VIEW COMPARISON:  Left wrist radiographs performed 04/05/2015 FINDINGS: There is no evidence of fracture or dislocation. The carpal rows are intact, and demonstrate normal alignment. The joint spaces are preserved. A plate and screws are partially imaged along the distal ulna. No significant soft tissue abnormalities are seen. IMPRESSION: No evidence of fracture or dislocation. Electronically Signed   By: Roanna RaiderJeffery  Chang M.D.   On: 07/23/2016 20:48   Dg Knee Complete 4 Views Left  Result Date: 07/23/2016 CLINICAL DATA:  Status post fall, with left knee swelling and pain. Initial encounter. EXAM: LEFT KNEE - COMPLETE 4+ VIEW COMPARISON:  Left knee radiographs performed 05/30/2016 FINDINGS: There is no evidence of fracture or dislocation. The joint spaces are preserved. Minimal marginal osteophyte formation is noted at the medial compartment; the patellofemoral joint is grossly unremarkable in appearance. No significant joint effusion is seen. The visualized soft tissues are normal in appearance. IMPRESSION: No evidence of  fracture or dislocation. Electronically Signed   By: Roanna RaiderJeffery  Chang M.D.   On: 07/23/2016 20:50    Procedures Procedures (including critical care time)  Medications Ordered in ED Medications - No data to display   Initial Impression / Assessment and Plan / ED Course  I have reviewed the triage vital signs and the nursing notes.  Pertinent imaging results that were available during my care of the patient were reviewed by me and considered in my medical decision making (see chart for details).  Clinical Course    Patient X-Ray negative for obvious fracture or dislocation. Pain managed in ED. Pt advised to follow up with orthopedics if symptoms persist for possibility of missed fracture diagnosis. Patient given brace while in ED, conservative therapy recommended and discussed. Patient will be dc home & is agreeable with above plan.  Final Clinical Impressions(s) / ED Diagnoses   Final diagnoses:  Fall, initial encounter  Wrist sprain, left, initial encounter  Contusion of left knee, initial encounter  Contusion of rib, left, initial encounter    New Prescriptions Discharge Medication List as of 07/23/2016  9:20 PM    START taking these medications   Details  naproxen (NAPROSYN) 500 MG tablet Take 1 tablet (500 mg total) by mouth 2 (two) times daily., Starting Fri 07/23/2016, Print    traMADol (ULTRAM) 50 MG tablet Take 1 tablet (50 mg total) by mouth every 6 (six) hours as needed., Starting Fri 07/23/2016, Print      I personally performed the services described in this documentation, which was scribed in my presence. The recorded information has been reviewed and is accurate.     [address]Hope Heritage LakeM Juaquin [place], [place]NP 07/24/16 [id number]    Nira ConnPedro Eduardo Cardama, MD 07/24/16 [phone]

## 2016-09-15 ENCOUNTER — Ambulatory Visit: Payer: Self-pay | Admitting: Physician Assistant

## 2016-09-15 ENCOUNTER — Encounter (HOSPITAL_BASED_OUTPATIENT_CLINIC_OR_DEPARTMENT_OTHER): Payer: Self-pay | Admitting: *Deleted

## 2016-09-15 NOTE — H&P (Signed)
Lauren Boyle is an 51 y.o. female.   Chief Complaint: left knee pain HPI: Lauren Boyle comes in today with a chief complaint of left knee pain. She stepped in a hole in the lawn. This injury occurred in December and she was seen in the emergency room setting. X-rays were negative. This has been affecting her ability to work. 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  She has been taking prescription doses of Ibuprofen without any relief. She complains of swelling, catching and giving away of the knee. Sharp stabbing pain with twisting and turning. 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  Past Medical History:  Diagnosis Date  . History of meniscal tear    left  . Iron deficiency     Past Surgical History:  Procedure Laterality Date  . ABDOMINAL HYSTERECTOMY     partial  . COLONOSCOPY    . ORIF ULNAR FRACTURE Left 04/14/2015   Procedure: OPEN REDUCTION INTERNAL FIXATION (ORIF) ULNAR FRACTURE;  Surgeon: Eldred Manges, MD;  Location: [facility] OR;  Service: Orthopedics;  Laterality: Left;    Family History  Problem Relation Age of Onset  . Hyperlipidemia Mother   . Hypertension Mother   . Hypertension Father   . Hyperlipidemia Father   . Diabetes Father   . Cancer Sister     ovarian  . Heart attack Neg Hx   . Sudden death Neg Hx    Social History:  reports that she has been smoking Cigarettes.  She has been smoking about 0.25 packs per day. She has never used smokeless tobacco. She reports that she drinks about 4.2 oz of alcohol per week . She reports that she does not use drugs.  Allergies:  Allergies  Allergen Reactions  . Penicillins Anaphylaxis and Itching    Has patient had a PCN reaction causing immediate rash, facial/tongue/throat swelling, SOB or lightheadedness with hypotension: Yes. Has patient had a PCN reaction causing severe rash involving mucus membranes or  skin necrosis: Yes Has patient had a PCN reaction that required hospitalization- No.- had to be given IV medication. Has patient had a PCN reaction occurring within the last 10 years: No. If all of the above answers are "NO", then may proceed with Cephalosporin use.      (Not in a hospital admission)  No results found for this or any previous visit (from the past 48 hour(s)). No results found.  Review of Systems  Musculoskeletal: Positive for falls and joint pain.  All other systems reviewed and are negative.   There were no vitals taken for this visit. Physical Exam  Constitutional: She is oriented to person, place, and time. She appears well-developed and well-nourished. No distress.  HENT:  Head: Normocephalic and atraumatic.  Eyes: Conjunctivae and EOM are normal. Pupils are equal, round, and reactive to light.  Neck: Normal range of motion. Neck supple.  Cardiovascular: Normal rate and intact distal pulses.   Respiratory: Effort normal. No respiratory distress.  GI: Soft. She exhibits no distension. There is no tenderness.  Musculoskeletal:       Left knee: She exhibits swelling. Tenderness found. Medial joint line tenderness noted.  Neurological: She is alert and oriented to person, place, and time.  Skin: Skin is warm and dry. No rash noted. No erythema.  Psychiatric: She has a normal mood and affect. Her behavior is normal.     Assessment/Plan  Left knee medial meniscus tear, loose body  Discussed risks benefits of left knee arthroscopy, debridement, medial menisectomy, loose body removal and she wishes to proceed.  This will be done outpatient as soon as able.  Margart SicklesChadwell, Quavis Klutz, PA-C 09/15/2016, 2:18 PM

## 2016-09-17 ENCOUNTER — Ambulatory Visit (HOSPITAL_BASED_OUTPATIENT_CLINIC_OR_DEPARTMENT_OTHER)
Admission: RE | Admit: 2016-09-17 | Discharge: 2016-09-17 | Disposition: A | Discharge status: Home / Self Care | Payer: Managed Care, Other (non HMO) | Source: Ambulatory Visit | Attending: Orthopedic Surgery | Admitting: Orthopedic Surgery

## 2016-09-17 ENCOUNTER — Encounter (HOSPITAL_BASED_OUTPATIENT_CLINIC_OR_DEPARTMENT_OTHER): Payer: Self-pay | Admitting: *Deleted

## 2016-09-17 ENCOUNTER — Encounter (HOSPITAL_BASED_OUTPATIENT_CLINIC_OR_DEPARTMENT_OTHER)
Admission: RE | Disposition: A | Discharge status: Home / Self Care | Payer: Self-pay | Source: Ambulatory Visit | Attending: Orthopedic Surgery

## 2016-09-17 ENCOUNTER — Ambulatory Visit (HOSPITAL_BASED_OUTPATIENT_CLINIC_OR_DEPARTMENT_OTHER): Payer: Managed Care, Other (non HMO) | Admitting: Certified Registered"

## 2016-09-17 DIAGNOSIS — F17210 Nicotine dependence, cigarettes, uncomplicated: Secondary | ICD-10-CM | POA: Insufficient documentation

## 2016-09-17 DIAGNOSIS — W1789XA Other fall from one level to another, initial encounter: Secondary | ICD-10-CM | POA: Insufficient documentation

## 2016-09-17 DIAGNOSIS — M94262 Chondromalacia, left knee: Secondary | ICD-10-CM | POA: Diagnosis not present

## 2016-09-17 DIAGNOSIS — S83232A Complex tear of medial meniscus, current injury, left knee, initial encounter: Secondary | ICD-10-CM | POA: Diagnosis not present

## 2016-09-17 HISTORY — PX: CHONDROPLASTY: SHX5177

## 2016-09-17 HISTORY — DX: Personal history of other (healed) physical injury and trauma: Z87.828

## 2016-09-17 HISTORY — PX: KNEE ARTHROSCOPY WITH MEDIAL MENISECTOMY: SHX5651

## 2016-09-17 SURGERY — ARTHROSCOPY, KNEE, WITH MEDIAL MENISCECTOMY
Anesthesia: General | Site: Knee | Laterality: Left

## 2016-09-17 MED ORDER — LACTATED RINGERS IV SOLN: INTRAVENOUS | Status: DC

## 2016-09-17 MED ORDER — LIDOCAINE 2% (20 MG/ML) 5 ML SYRINGE
INTRAMUSCULAR | Status: AC
  Filled 2016-09-17: qty 5

## 2016-09-17 MED ORDER — DEXAMETHASONE SODIUM PHOSPHATE 10 MG/ML IJ SOLN
INTRAMUSCULAR | Status: DC | PRN
  Administered 2016-09-17: 10 mg via INTRAVENOUS

## 2016-09-17 MED ORDER — SCOPOLAMINE 1 MG/3DAYS TD PT72: 1.0000 | MEDICATED_PATCH | Freq: Once | TRANSDERMAL | Status: DC | PRN

## 2016-09-17 MED ORDER — FENTANYL CITRATE (PF) 100 MCG/2ML IJ SOLN
INTRAMUSCULAR | Status: AC
  Filled 2016-09-17: qty 2

## 2016-09-17 MED ORDER — CLINDAMYCIN PHOSPHATE 900 MG/50ML IV SOLN
INTRAVENOUS | Status: AC
  Filled 2016-09-17: qty 50

## 2016-09-17 MED ORDER — LIDOCAINE HCL (CARDIAC) 20 MG/ML IV SOLN
INTRAVENOUS | Status: DC | PRN
  Administered 2016-09-17: 60 mg via INTRAVENOUS

## 2016-09-17 MED ORDER — PROPOFOL 500 MG/50ML IV EMUL
INTRAVENOUS | Status: AC
  Filled 2016-09-17: qty 50

## 2016-09-17 MED ORDER — BUPIVACAINE-EPINEPHRINE 0.5% -1:200000 IJ SOLN
INTRAMUSCULAR | Status: DC | PRN
  Administered 2016-09-17: 20 mL

## 2016-09-17 MED ORDER — EPINEPHRINE PF 1 MG/ML IJ SOLN
INTRAMUSCULAR | Status: DC | PRN
  Administered 2016-09-17: 5000 mL

## 2016-09-17 MED ORDER — CLINDAMYCIN PHOSPHATE 900 MG/50ML IV SOLN
900.0000 mg | INTRAVENOUS | Status: AC
  Administered 2016-09-17: 900 mg via INTRAVENOUS

## 2016-09-17 MED ORDER — METHYLPREDNISOLONE ACETATE 40 MG/ML IJ SUSP
INTRAMUSCULAR | Status: AC
  Filled 2016-09-17: qty 1

## 2016-09-17 MED ORDER — CHLORHEXIDINE GLUCONATE 4 % EX LIQD: 60.0000 mL | Freq: Once | CUTANEOUS | Status: DC

## 2016-09-17 MED ORDER — FENTANYL CITRATE (PF) 100 MCG/2ML IJ SOLN
25.0000 ug | INTRAMUSCULAR | Status: DC | PRN
  Administered 2016-09-17: 50 ug via INTRAVENOUS

## 2016-09-17 MED ORDER — FENTANYL CITRATE (PF) 100 MCG/2ML IJ SOLN
50.0000 ug | INTRAMUSCULAR | Status: AC | PRN
  Administered 2016-09-17 (×2): 25 ug via INTRAVENOUS
  Administered 2016-09-17: 100 ug via INTRAVENOUS

## 2016-09-17 MED ORDER — KETOROLAC TROMETHAMINE 30 MG/ML IJ SOLN
INTRAMUSCULAR | Status: DC | PRN
  Administered 2016-09-17: 30 mg via INTRAVENOUS

## 2016-09-17 MED ORDER — LACTATED RINGERS IV SOLN
INTRAVENOUS | Status: DC
  Administered 2016-09-17 (×2): via INTRAVENOUS

## 2016-09-17 MED ORDER — ONDANSETRON HCL 4 MG/2ML IJ SOLN
INTRAMUSCULAR | Status: DC | PRN
Start: 2016-09-17 — End: 2016-09-17
  Administered 2016-09-17: 4 mg via INTRAVENOUS

## 2016-09-17 MED ORDER — ONDANSETRON HCL 4 MG/2ML IJ SOLN
INTRAMUSCULAR | Status: AC
  Filled 2016-09-17: qty 2

## 2016-09-17 MED ORDER — PROPOFOL 10 MG/ML IV BOLUS
INTRAVENOUS | Status: DC | PRN
  Administered 2016-09-17: 200 mg via INTRAVENOUS

## 2016-09-17 MED ORDER — MEPERIDINE HCL 25 MG/ML IJ SOLN: 6.2500 mg | INTRAMUSCULAR | Status: DC | PRN

## 2016-09-17 MED ORDER — METHYLPREDNISOLONE ACETATE 80 MG/ML IJ SUSP
INTRAMUSCULAR | Status: AC
  Filled 2016-09-17: qty 1

## 2016-09-17 MED ORDER — DEXAMETHASONE SODIUM PHOSPHATE 10 MG/ML IJ SOLN
INTRAMUSCULAR | Status: AC
  Filled 2016-09-17: qty 1

## 2016-09-17 MED ORDER — BUPIVACAINE-EPINEPHRINE (PF) 0.5% -1:200000 IJ SOLN
INTRAMUSCULAR | Status: AC
  Filled 2016-09-17: qty 30

## 2016-09-17 MED ORDER — MIDAZOLAM HCL 2 MG/2ML IJ SOLN
INTRAMUSCULAR | Status: AC
  Filled 2016-09-17: qty 2

## 2016-09-17 MED ORDER — EPINEPHRINE 30 MG/30ML IJ SOLN
INTRAMUSCULAR | Status: AC
  Filled 2016-09-17: qty 1

## 2016-09-17 MED ORDER — EPHEDRINE SULFATE 50 MG/ML IJ SOLN
INTRAMUSCULAR | Status: DC | PRN
  Administered 2016-09-17 (×3): 10 mg via INTRAVENOUS

## 2016-09-17 MED ORDER — SODIUM CHLORIDE 0.9 % IV SOLN: INTRAVENOUS | Status: DC

## 2016-09-17 MED ORDER — BUPIVACAINE HCL (PF) 0.5 % IJ SOLN
INTRAMUSCULAR | Status: AC
  Filled 2016-09-17: qty 30

## 2016-09-17 MED ORDER — MIDAZOLAM HCL 2 MG/2ML IJ SOLN
1.0000 mg | INTRAMUSCULAR | Status: DC | PRN
  Administered 2016-09-17: 2 mg via INTRAVENOUS

## 2016-09-17 MED ORDER — METOCLOPRAMIDE HCL 5 MG/ML IJ SOLN: 10.0000 mg | Freq: Once | INTRAMUSCULAR | Status: DC | PRN

## 2016-09-17 MED ORDER — METHYLPREDNISOLONE ACETATE 80 MG/ML IJ SUSP
INTRAMUSCULAR | Status: DC | PRN
  Administered 2016-09-17: 80 mg via INTRA_ARTICULAR

## 2016-09-17 SURGICAL SUPPLY — 39 items
BANDAGE ACE 6X5 VEL STRL LF (GAUZE/BANDAGES/DRESSINGS) IMPLANT
BANDAGE ESMARK 6X9 LF (GAUZE/BANDAGES/DRESSINGS) IMPLANT
BLADE 4.2CUDA (BLADE) ×2 IMPLANT
BLADE CUDA GRT WHITE 3.5 (BLADE) ×2 IMPLANT
BNDG ESMARK 6X9 LF (GAUZE/BANDAGES/DRESSINGS)
BNDG GAUZE ELAST 4 BULKY (GAUZE/BANDAGES/DRESSINGS) ×2 IMPLANT
DRAPE ARTHROSCOPY W/POUCH 90 (DRAPES) ×2 IMPLANT
DRSG EMULSION OIL 3X3 NADH (GAUZE/BANDAGES/DRESSINGS) ×2 IMPLANT
DURAPREP 26ML APPLICATOR (WOUND CARE) ×2 IMPLANT
GAUZE SPONGE 4X4 12PLY STRL (GAUZE/BANDAGES/DRESSINGS) ×2 IMPLANT
GLOVE BIO SURGEON STRL SZ 6.5 (GLOVE) ×2 IMPLANT
GLOVE BIO SURGEON STRL SZ7.5 (GLOVE) ×2 IMPLANT
GLOVE BIOGEL PI IND STRL 7.0 (GLOVE) ×3 IMPLANT
GLOVE BIOGEL PI IND STRL 8 (GLOVE) ×2 IMPLANT
GLOVE BIOGEL PI INDICATOR 7.0 (GLOVE) ×3
GLOVE BIOGEL PI INDICATOR 8 (GLOVE) ×2
GLOVE SURG ORTHO 8.0 STRL STRW (GLOVE) ×2 IMPLANT
GLOVE SURG SS PI 6.5 STRL IVOR (GLOVE) ×2 IMPLANT
GOWN STRL REUS W/ TWL LRG LVL3 (GOWN DISPOSABLE) ×1 IMPLANT
GOWN STRL REUS W/ TWL XL LVL3 (GOWN DISPOSABLE) ×2 IMPLANT
GOWN STRL REUS W/TWL LRG LVL3 (GOWN DISPOSABLE) ×1
GOWN STRL REUS W/TWL XL LVL3 (GOWN DISPOSABLE) ×2
HOLDER KNEE FOAM BLUE (MISCELLANEOUS) ×2 IMPLANT
KNEE WRAP E Z 3 GEL PACK (MISCELLANEOUS) ×2 IMPLANT
MANIFOLD NEPTUNE II (INSTRUMENTS) ×2 IMPLANT
NDL SAFETY ECLIPSE 18X1.5 (NEEDLE) ×1 IMPLANT
NEEDLE HYPO 18GX1.5 SHARP (NEEDLE) ×1
PACK ARTHROSCOPY DSU (CUSTOM PROCEDURE TRAY) ×2 IMPLANT
PACK BASIN DAY SURGERY FS (CUSTOM PROCEDURE TRAY) ×2 IMPLANT
PROBE BIPOLAR ARTHRO 85MM 30D (MISCELLANEOUS) IMPLANT
PROBE BIPOLAR ATHRO 135MM 90D (MISCELLANEOUS) IMPLANT
SET ARTHROSCOPY TUBING (MISCELLANEOUS) ×1
SET ARTHROSCOPY TUBING LN (MISCELLANEOUS) ×1 IMPLANT
SUT ETHILON 4 0 PS 2 18 (SUTURE) ×2 IMPLANT
SYR 3ML 18GX1 1/2 (SYRINGE) ×2 IMPLANT
SYR 5ML LL (SYRINGE) ×2 IMPLANT
TOWEL OR 17X24 6PK STRL BLUE (TOWEL DISPOSABLE) ×2 IMPLANT
TOWEL OR NON WOVEN STRL DISP B (DISPOSABLE) ×2 IMPLANT
WATER STERILE IRR 1000ML POUR (IV SOLUTION) ×2 IMPLANT

## 2016-09-17 NOTE — Transfer of Care (Signed)
Immediate Anesthesia Transfer of Care Note  Patient: Lauren Boyle  Procedure(s) Performed: Procedure(s): KNEE ARTHROSCOPY WITH PARTIAL MEDIAL MENISECTOMY (Left) CHONDROPLASTY (Left)  Patient Location: PACU  Anesthesia Type:General  Level of Consciousness: awake and patient cooperative  Airway & Oxygen Therapy: Patient Spontanous Breathing and Patient connected to face mask oxygen  Post-op Assessment: Report given to RN and Post -op Vital signs reviewed and stable  Post vital signs: Reviewed and stable  Last Vitals:  Vitals:   09/17/16 0638  BP: (!) 112/57  Pulse: 66  Resp: 20  Temp: 36.8 C    Last Pain:  Vitals:   09/17/16 [id number]  TempSrc: Oral         Complications: No apparent anesthesia complications

## 2016-09-17 NOTE — Anesthesia Preprocedure Evaluation (Addendum)
Anesthesia Evaluation  Patient identified by MRN, date of birth, ID band Patient awake    Reviewed: Allergy & Precautions, NPO status , Patient's Chart, lab work & pertinent test results  Airway Mallampati: II  TM Distance: >3 FB Neck ROM: Full    Dental no notable dental hx.    Pulmonary neg pulmonary ROS, Current Smoker,    Pulmonary exam normal breath sounds clear to auscultation       Cardiovascular negative cardio ROS Normal cardiovascular exam Rhythm:Regular Rate:Normal     Neuro/Psych negative neurological ROS  negative psych ROS   GI/Hepatic negative GI ROS, Neg liver ROS,   Endo/Other  negative endocrine ROS  Renal/GU negative Renal ROS  negative genitourinary   Musculoskeletal negative musculoskeletal ROS (+)   Abdominal   Peds negative pediatric ROS (+)  Hematology negative hematology ROS (+)   Anesthesia Other Findings   Reproductive/Obstetrics negative OB ROS                            Anesthesia Physical Anesthesia Plan  ASA: II  Anesthesia Plan: General   Post-op Pain Management:    Induction: Intravenous  Airway Management Planned: LMA  Additional Equipment:   Intra-op Plan:   Post-operative Plan: Extubation in OR  Informed Consent: I have reviewed the patients History and Physical, chart, labs and discussed the procedure including the risks, benefits and alternatives for the proposed anesthesia with the patient or authorized representative who has indicated his/her understanding and acceptance.   Dental advisory given  Plan Discussed with: CRNA  Anesthesia Plan Comments:        Anesthesia Quick Evaluation  

## 2016-09-17 NOTE — Discharge Instructions (Signed)
Diet: As you were doing prior to hospitalization   Activity: Increase activity slowly as tolerated  No lifting or driving for 48 hours  Shower: May shower without a dressing on post op day #2, NO SOAKING in tub   Dressing: You may change your dressing on post op day #2 Then change the dressing daily with sterile 4"x4"s gauze dressing  Or band aids  Weight Bearing: weight bearing as tolerated    Post Anesthesia Home Care Instructions  Activity: Get plenty of rest for the remainder of the day. A responsible adult should stay with you for 24 hours following the procedure.  For the next 24 hours, DO NOT: -Drive a car -Advertising copywriterperate machinery -Drink alcoholic beverages -Take any medication unless instructed by your physician -Make any legal decisions or sign important papers.  Meals: Start with liquid foods such as gelatin or soup. Progress to regular foods as tolerated. Avoid greasy, spicy, heavy foods. If nausea and/or vomiting occur, drink only clear liquids until the nausea and/or vomiting subsides. Call your physician if vomiting continues.  Special Instructions/Symptoms: Your throat may feel dry or sore from the anesthesia or the breathing tube placed in your throat during surgery. If this causes discomfort, gargle with warm salt water. The discomfort should disappear within 24 hours.  If you had a scopolamine patch placed behind your ear for the management of post- operative nausea and/or vomiting:  1. The medication in the patch is effective for 72 hours, after which it should be removed.  Wrap patch in a tissue and discard in the trash. Wash hands thoroughly with soap and water. 2. You may remove the patch earlier than 72 hours if you experience unpleasant side effects which may include dry mouth, dizziness or visual disturbances. 3. Avoid touching the patch. Wash your hands with soap and water after contact with the patch.    To prevent constipation: you may use a stool softener  such as -  Colace ( over the counter) 100 mg by mouth twice a day  Drink plenty of fluids ( prune juice may be helpful) and high fiber foods  Miralax ( over the counter) for constipation as needed.   Precautions: If you experience chest pain or shortness of breath - call 911 immediately For transfer to the hospital emergency department!!  If you develop a fever greater that 101 F, purulent drainage from wound, increased redness or drainage from wound, or calf pain -- Call the office   Follow- Up Appointment: Please call for an appointment to be seen in 1 [place] [phone]

## 2016-09-17 NOTE — Anesthesia Postprocedure Evaluation (Signed)
Anesthesia Post Note  Patient: Lenn SinkStephanie Dehaven  Procedure(s) Performed: Procedure(s) (LRB): KNEE ARTHROSCOPY WITH PARTIAL MEDIAL MENISECTOMY (Left) CHONDROPLASTY (Left)  Patient location during evaluation: PACU Anesthesia Type: General Level of consciousness: awake and alert Pain management: pain level controlled Vital Signs Assessment: post-procedure vital signs reviewed and stable Respiratory status: spontaneous breathing, nonlabored ventilation, respiratory function stable and patient connected to nasal cannula oxygen Cardiovascular status: blood pressure returned to baseline and stable Postop Assessment: no signs of nausea or vomiting Anesthetic complications: no       Last Vitals:  Vitals:   09/17/16 0915 09/17/16 0945  BP: 108/68 124/63  Pulse: 89 88  Resp: 14 16  Temp:  36.6 C    Last Pain:  Vitals:   09/17/16 0945  TempSrc:   PainSc: 3                  Phillips Groutarignan, Candia Kingsbury

## 2016-09-17 NOTE — Op Note (Signed)
NAMCari Caraway:  Calleros, Skylar            ACCOUNT NO.:  [id number]  MEDICAL RECORD NO.:  [id number]  LOCATION:                                 FACILITY:  PHYSICIAN:  Dyke BrackettW. D. Lundyn Coste, M.D.    DATE OF BIRTH:  04/19/1966  DATE OF PROCEDURE:  09/17/2016 DATE OF DISCHARGE:                              OPERATIVE REPORT   PREOPERATIVE DIAGNOSES: 1. Complex tear, posterior horn of medial meniscus, left knee. 2. Grade 3 chondromalacia, lateral trochlear groove, medial     compartment.  POSTOPERATIVE DIAGNOSES: 1. Complex tear, posterior horn of medial meniscus, left knee. 2. Grade 3 chondromalacia, lateral trochlear groove, medial     compartment.  OPERATION: 1. Partial medial meniscectomy (posterior horn). 2. Chondroplasty, lateral trochlear groove and medial compartment.  SURGEON:  Dyke BrackettW. D. Alera Quevedo, MD.  ANESTHESIA:  General with local supplementation.  DESCRIPTION OF PROCEDURE:  Inferomedial and inferolateral portals created.  Systematic inspection of the knee showed the patient to have a very small area of chondral irregularity, grade 3 about 1 x 2 cm on the lateral trochlear groove and more towards the lateral actually anterolateral condyle which was debrided.  Lateral compartment was essentially normal.  Complex tear of the posterior horn of the meniscus, cautery dissection about 40% of the meniscus substance.  There was undulation in the posterior aspect of the knee with some surrounding chondral irregularity in the posterior 1/4th of the tibial plateau and then some moderate chondral irregularity of the femoral condyle, grade 3 over about 50% of the weightbearing surface.  Again, chondroplasty was carried out in the aforementioned areas with partial meniscectomy.  Knee drained free of fluid.  Portals closed with nylon.  Knee was infiltrated with 80 mg Depo-Medrol with an addition of 20 mL 0.5% Marcaine with epinephrine, some of which was placed in the joint, some in the subcutaneous  tissues in the portal site.  Taken to recovery room in stable condition.    Dyke BrackettW. D. Breena Bevacqua, M.D.   ______________________________ Dyke BrackettW. D. Ritaj Dullea, M.D.   WDC/MEDQ  D:  09/17/2016  T:  09/17/2016  Job:  [id number]

## 2016-09-17 NOTE — H&P (View-Only) (Signed)
Lauren Boyle is an 51 y.o. female.   Chief Complaint: left knee pain HPI: Lauren Boyle comes in today with a chief complaint of left knee pain. She stepped in a hole in the lawn. This injury occurred in December and she was seen in the emergency room setting. X-rays were negative. This has been affecting her ability to work. 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  She has been taking prescription doses of Ibuprofen without any relief. She complains of swelling, catching and giving away of the knee. Sharp stabbing pain with twisting and turning. 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  Past Medical History:  Diagnosis Date  . History of meniscal tear    left  . Iron deficiency     Past Surgical History:  Procedure Laterality Date  . ABDOMINAL HYSTERECTOMY     partial  . COLONOSCOPY    . ORIF ULNAR FRACTURE Left 04/14/2015   Procedure: OPEN REDUCTION INTERNAL FIXATION (ORIF) ULNAR FRACTURE;  Surgeon: Eldred Manges, MD;  Location: [facility] OR;  Service: Orthopedics;  Laterality: Left;    Family History  Problem Relation Age of Onset  . Hyperlipidemia Mother   . Hypertension Mother   . Hypertension Father   . Hyperlipidemia Father   . Diabetes Father   . Cancer Sister     ovarian  . Heart attack Neg Hx   . Sudden death Neg Hx    Social History:  reports that she has been smoking Cigarettes.  She has been smoking about 0.25 packs per day. She has never used smokeless tobacco. She reports that she drinks about 4.2 oz of alcohol per week . She reports that she does not use drugs.  Allergies:  Allergies  Allergen Reactions  . Penicillins Anaphylaxis and Itching    Has patient had a PCN reaction causing immediate rash, facial/tongue/throat swelling, SOB or lightheadedness with hypotension: Yes. Has patient had a PCN reaction causing severe rash involving mucus membranes or  skin necrosis: Yes Has patient had a PCN reaction that required hospitalization- No.- had to be given IV medication. Has patient had a PCN reaction occurring within the last 10 years: No. If all of the above answers are "NO", then may proceed with Cephalosporin use.      (Not in a hospital admission)  No results found for this or any previous visit (from the past 48 hour(s)). No results found.  Review of Systems  Musculoskeletal: Positive for falls and joint pain.  All other systems reviewed and are negative.   There were no vitals taken for this visit. Physical Exam  Constitutional: She is oriented to person, place, and time. She appears well-developed and well-nourished. No distress.  HENT:  Head: Normocephalic and atraumatic.  Eyes: Conjunctivae and EOM are normal. Pupils are equal, round, and reactive to light.  Neck: Normal range of motion. Neck supple.  Cardiovascular: Normal rate and intact distal pulses.   Respiratory: Effort normal. No respiratory distress.  GI: Soft. She exhibits no distension. There is no tenderness.  Musculoskeletal:       Left knee: She exhibits swelling. Tenderness found. Medial joint line tenderness noted.  Neurological: She is alert and oriented to person, place, and time.  Skin: Skin is warm and dry. No rash noted. No erythema.  Psychiatric: She has a normal mood and affect. Her behavior is normal.     Assessment/Plan  Left knee medial meniscus tear, loose body  Discussed risks benefits of left knee arthroscopy, debridement, medial menisectomy, loose body removal and she wishes to proceed.  This will be done outpatient as soon as able.  Lauren Boyle, Lauren Harnish, PA-C 09/15/2016, 2:18 PM

## 2016-09-17 NOTE — Interval H&P Note (Signed)
History and Physical Interval Note:  09/17/2016 7:25 AM  Lauren SinkStephanie Hanigan  has presented today for surgery, with the diagnosis of LEFT KNEE MENISCAL TEAR  The various methods of treatment have been discussed with the patient and family. After consideration of risks, benefits and other options for treatment, the patient has consented to  Procedure(s): KNEE ARTHROSCOPY WITH MEDIAL MENISECTOMY (Left) as a surgical intervention .  The patient's history has been reviewed, patient examined, no change in status, stable for surgery.  I have reviewed the patient's chart and labs.  Questions were answered to the patient's satisfaction.     Terriana Barreras JR,W D

## 2016-09-17 NOTE — Anesthesia Procedure Notes (Signed)
Procedure Name: LMA Insertion Date/Time: 09/17/2016 7:37 AM Performed by: Cavin Longman D Pre-anesthesia Checklist: Patient identified, Emergency Drugs available, Suction available and Patient being monitored Patient Re-evaluated:Patient Re-evaluated prior to inductionOxygen Delivery Method: Circle system utilized Preoxygenation: Pre-oxygenation with 100% oxygen Intubation Type: IV induction Ventilation: Mask ventilation without difficulty LMA: LMA inserted LMA Size: 4.0 Number of attempts: 1 Airway Equipment and Method: Bite block Placement Confirmation: positive ETCO2 Tube secured with: Tape Dental Injury: Teeth and Oropharynx as per pre-operative assessment

## 2016-09-20 ENCOUNTER — Encounter (HOSPITAL_BASED_OUTPATIENT_CLINIC_OR_DEPARTMENT_OTHER): Payer: Self-pay | Admitting: Orthopedic Surgery

## 2016-09-23 IMAGING — MR MR PELVIS WO/W CM
6 of 10 series · 25 of 48 positions shown · IV contrast (multihance)
Comparison: None.

CLINICAL DATA: Evaluate 4 cm mass located on right ovary. Previous
hysterectomy.

EXAM:
MRI PELVIS WITHOUT AND WITH CONTRAST
TECHNIQUE: Multiplanar multisequence MR imaging of the pelvis was performed
both before and after administration of intravenous contrast.
CONTRAST:  20mL MULTIHANCE GADOBENATE DIMEGLUMINE 529 MG/ML IV SOLN

[Series 2: t2_tse_sag · sagittal · 4.0mm · 0.62mm/px · 6 of 27 slices shown]
[im 1/27]
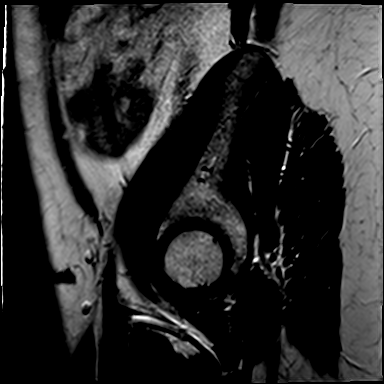
[im 6/27]
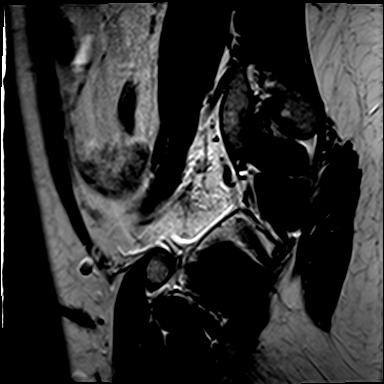
[im 11/27]
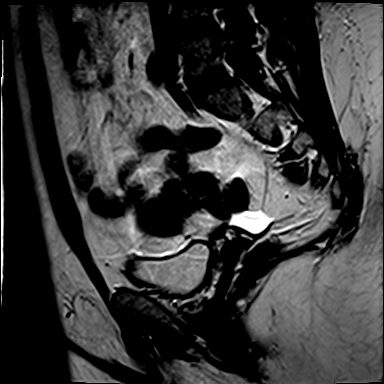
[im 16/27]
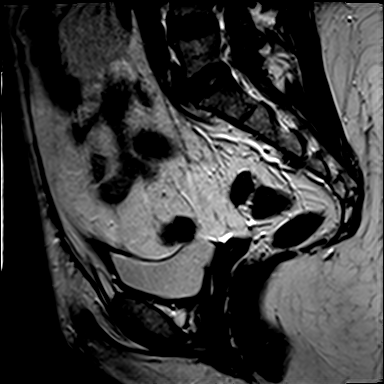
[im 21/27]
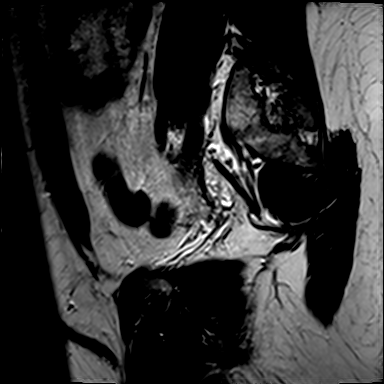
[im 27/27]
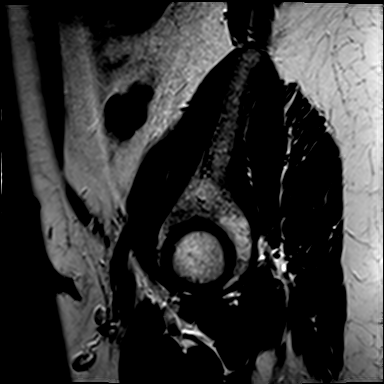

[Series 3: T2 · coronal · 5.0mm · 0.55mm/px · 6 of 27 slices shown]
[im 1/27]
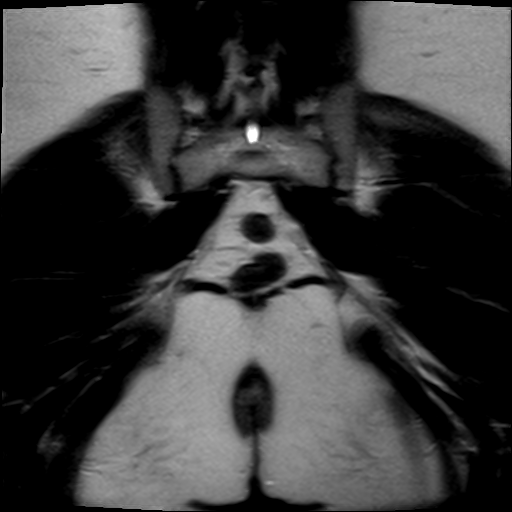
[im 6/27]
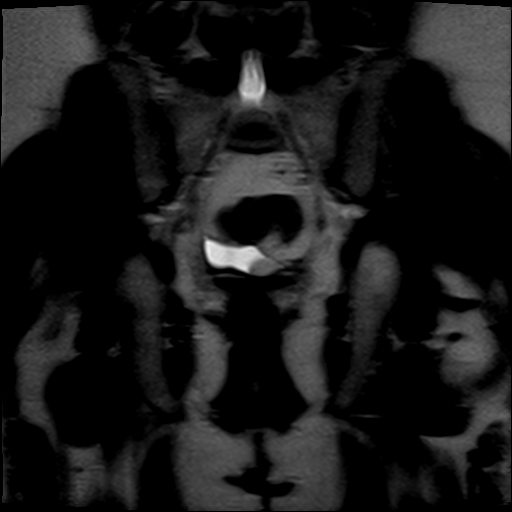
[im 11/27]
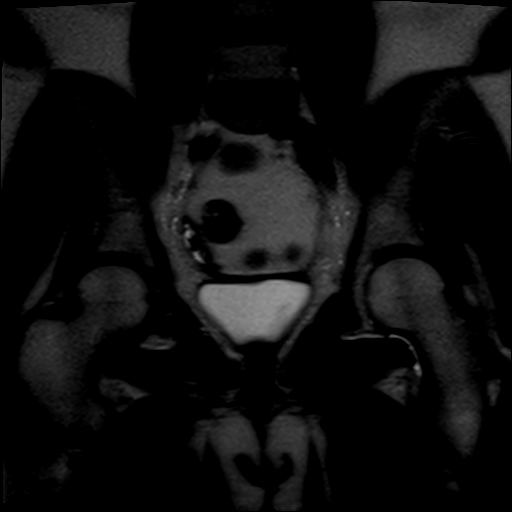
[im 16/27]
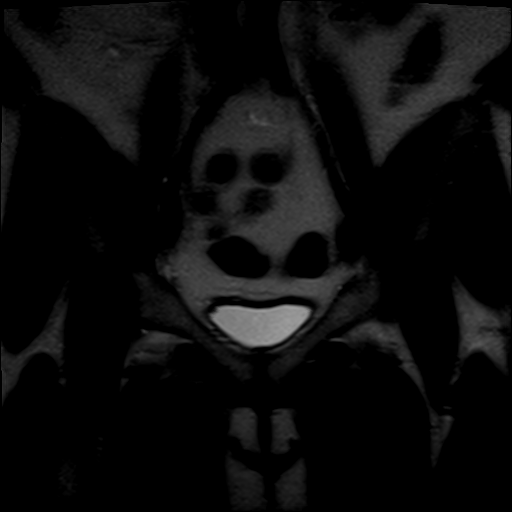
[im 21/27]
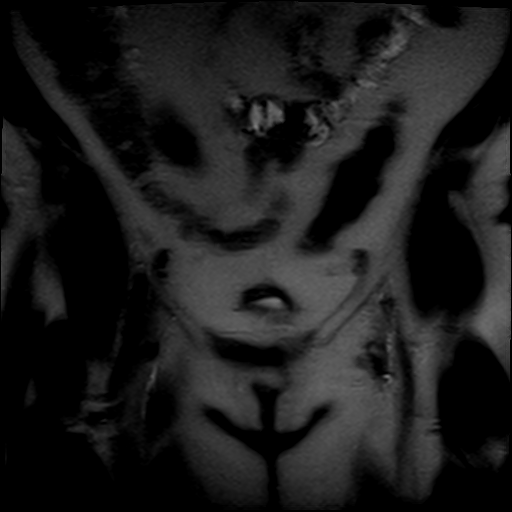
[im 27/27]
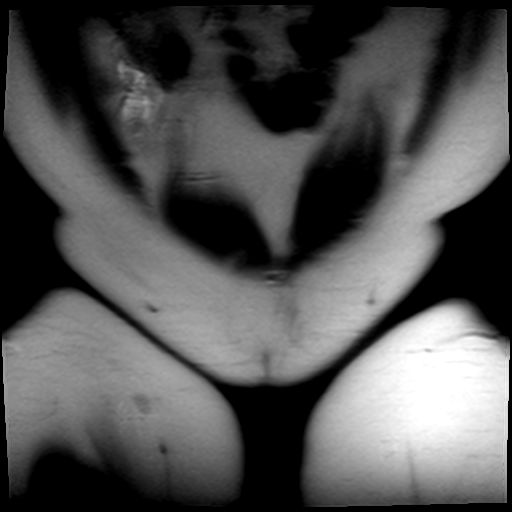

[Series 4: t2_tse axial · axial · 6.0mm · 0.62mm/px · z∈[-75,+62]mm · 4 of 20 slices shown]
[im 1/20]
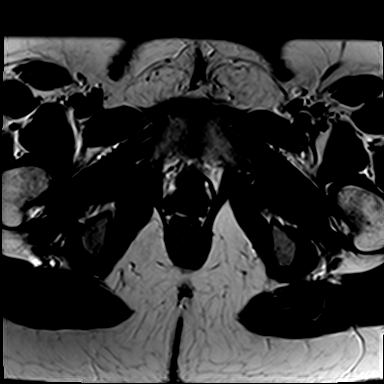
[im 7/20]
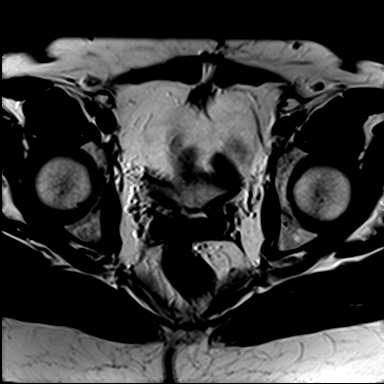
[im 13/20]
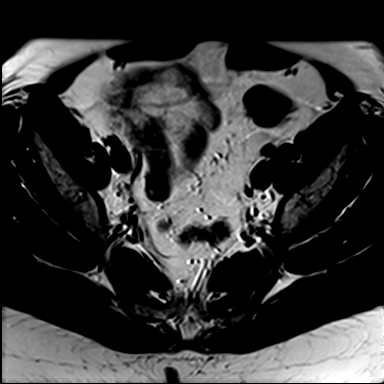
[im 20/20]
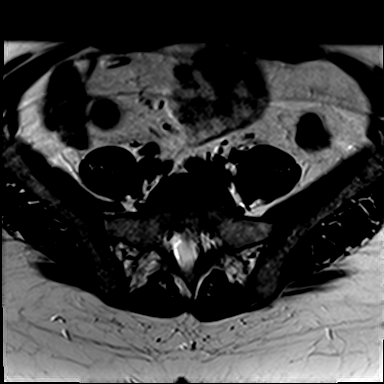

[Series 5: t2_tse axial fs · axial · 6.0mm · 0.62mm/px · z∈[-75,+62]mm · 4 of 20 slices shown]
[im 1/20]
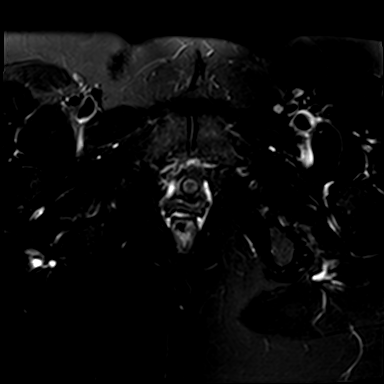
[im 7/20]
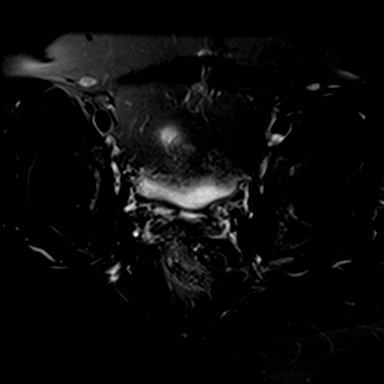
[im 13/20]
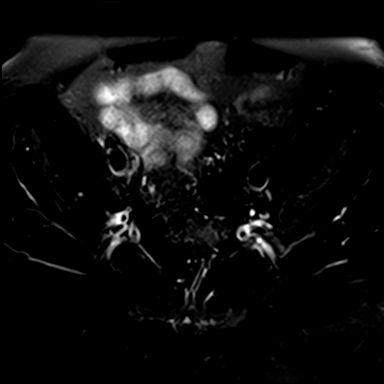
[im 20/20]
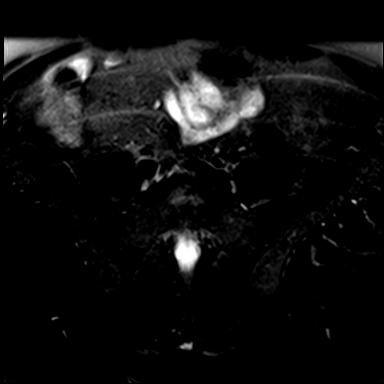

[Series 6: axial spgr · axial · 6.0mm · 0.49mm/px · z∈[-75,+62]mm · 4 of 20 slices shown]
[im 1/20]
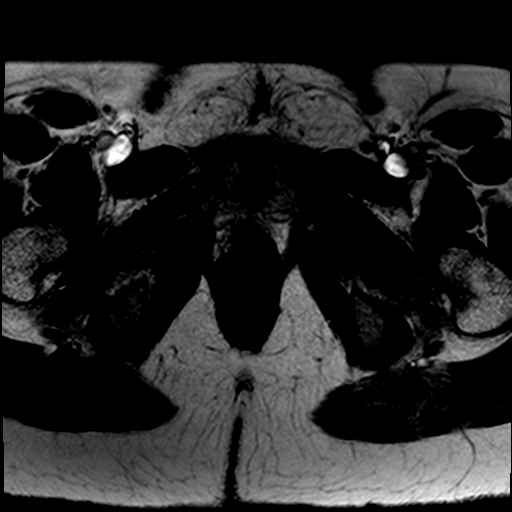
[im 7/20]
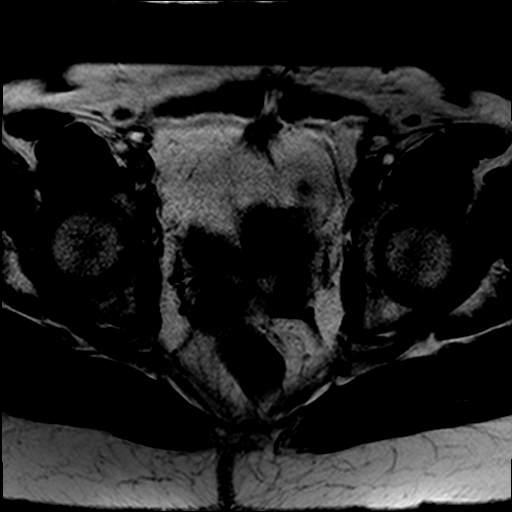
[im 13/20]
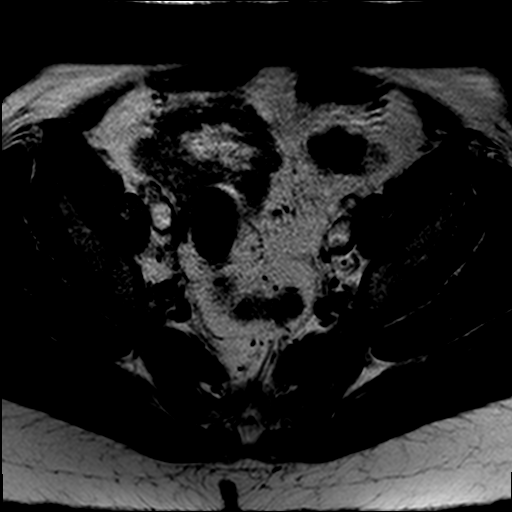
[im 20/20]
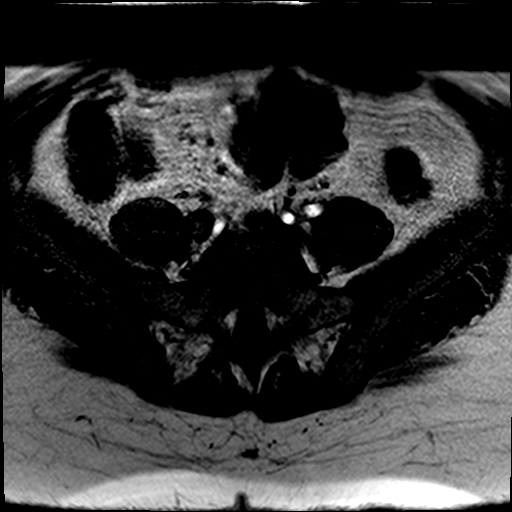

[Series 7: axial spgr pre · axial · non-contrast · 6.0mm · 0.49mm/px · 1 of 20 slices shown]
[im 1/20]
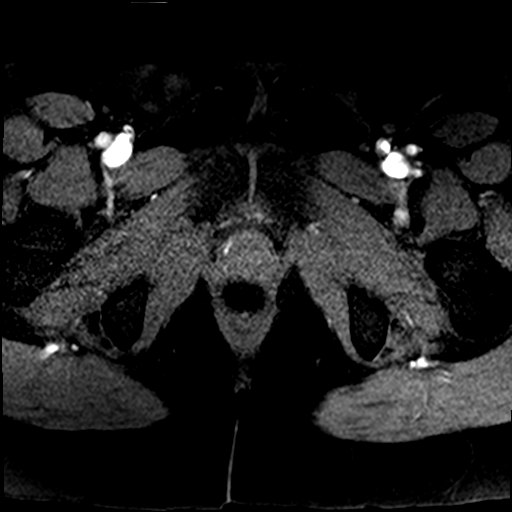

[25 of 48 positions shown; findings below may reference images not displayed]

FINDINGS: The right ovary measures 2.9 x 1.7 cm by 2.7 cm. This is similar to
the previous exam. A small area of loculated fluid is identified
just below the right ovary. This measures 2.7 x 2.2 cm, image
12/series 5. On the previous exam this measured 2.9 x 1.7 cm. A
single thin internal area of septation is identified within this
fluid signal intensity structure. Previous hysterectomy. Left
oophorectomy.

The urinary bladder is normal. No significant free fluid identified.
The pelvic bowel loops are unremarkable. No enlarged pelvic or
inguinal lymph nodes.
IMPRESSION: 1. Stable size of right ovary.  No discrete mass identified.
2. Small fluid signal intensity structure just below the right ovary
is favored to represent sequelae of prior hysterectomy. This may
represent a small lymphocele or chronic seroma. Recommend followup
examination in 12 months to confirm stability. At this time a pelvic
sonogram should be sufficient to re-evaluate this likely benign
structure.

## 2017-02-06 ENCOUNTER — Encounter (HOSPITAL_COMMUNITY): Payer: Self-pay | Admitting: Emergency Medicine

## 2017-02-06 ENCOUNTER — Emergency Department (HOSPITAL_COMMUNITY)
Admission: EM | Admit: 2017-02-06 | Discharge: 2017-02-06 | Disposition: A | Discharge status: Home / Self Care | Payer: Managed Care, Other (non HMO) | Attending: Emergency Medicine | Admitting: Emergency Medicine

## 2017-02-06 ENCOUNTER — Emergency Department (HOSPITAL_COMMUNITY): Payer: Managed Care, Other (non HMO)

## 2017-02-06 DIAGNOSIS — F17210 Nicotine dependence, cigarettes, uncomplicated: Secondary | ICD-10-CM | POA: Insufficient documentation

## 2017-02-06 DIAGNOSIS — M25562 Pain in left knee: Secondary | ICD-10-CM | POA: Insufficient documentation

## 2017-02-06 MED ORDER — HYDROCODONE-ACETAMINOPHEN 5-325 MG PO TABS
1.0000 | ORAL_TABLET | Freq: Once | ORAL | Status: AC
  Administered 2017-02-06: 1 via ORAL
  Filled 2017-02-06: qty 1

## 2017-02-06 MED ORDER — HYDROCODONE-ACETAMINOPHEN 5-325 MG PO TABS: 1.0000 | ORAL_TABLET | Freq: Four times a day (QID) | ORAL | 0 refills | Status: DC | PRN

## 2017-02-06 NOTE — ED Notes (Signed)
Bed: [id number] Expected date:  Expected time:  Means of arrival:  Comments: 51yo F knee pain

## 2017-02-06 NOTE — Discharge Instructions (Signed)
1. Medications: use naprosyn for pain control and vicodin for severe pain; usual home medications 2. Treatment: rest, ice, elevate and use brace and crutches, drink plenty of fluids, gentle stretching 3. Follow Up: Please followup with orthopedics as directed for discussion of your diagnoses and further evaluation after today's visit; if you do not have a primary care doctor use the resource guide provided to find one; Please return to the ER for worsening symptoms or other concerns

## 2017-02-06 NOTE — ED Provider Notes (Signed)
[facility]-EMERGENCY DEPT Provider Note   CSN: [id number] Arrival date & time: 02/06/17  0046     History   Chief Complaint Chief Complaint  Patient presents with  . Knee Pain    HPI Lauren Boyle is a 51 y.o. female with a hx of left meniscal tear presents to the Emergency Department complaining of gradual, persistent, progressively worsening Left knee pain onset Tuesday of this last week worsening on Thursday (2 days ago).  Patient reports she had arthroscopic surgery of her left knee in February 2018 by Dr. Madelon Lips.  His operative note states arthroscopic partial medial meniscectomy and chondroplasty of the lateral trochlear groove and medial compartment. Patient reports she was out of work for one month and returned in mid March. She reports that working tortious sometimes her knee swells has pain but this week has been more than usual. She denies falls or no trauma. She denies twisting, hearing a popping sound or locking of her knee. She denies fevers or chills, erythema of the knee. She does report associated joint effusion slightly worse than usual.  The history is provided by the patient and medical records. No language interpreter was used.    Past Medical History:  Diagnosis Date  . History of meniscal tear    left  . Iron deficiency     Patient Active Problem List   Diagnosis Date Noted  . Adrenal mass, right (HCC) 05/27/2014  . Low back pain 07/04/2013    Past Surgical History:  Procedure Laterality Date  . ABDOMINAL HYSTERECTOMY     partial  . CHONDROPLASTY Left 09/17/2016   Procedure: CHONDROPLASTY;  Surgeon: Frederico Hamman, MD;  Location: [facility];  Service: Orthopedics;  Laterality: Left;  . COLONOSCOPY    . KNEE ARTHROSCOPY WITH MEDIAL MENISECTOMY Left 09/17/2016   Procedure: KNEE ARTHROSCOPY WITH PARTIAL MEDIAL MENISECTOMY;  Surgeon: Frederico Hamman, MD;  Location: [facility];  Service: Orthopedics;  Laterality: Left;  . ORIF  ULNAR FRACTURE Left 04/14/2015   Procedure: OPEN REDUCTION INTERNAL FIXATION (ORIF) ULNAR FRACTURE;  Surgeon: Eldred Manges, MD;  Location: [facility] OR;  Service: Orthopedics;  Laterality: Left;    OB History    No data available       Home Medications    Prior to Admission medications   Medication Sig Start Date End Date Taking? Authorizing Provider  ibuprofen (ADVIL,MOTRIN) 200 MG tablet Take 200 mg by mouth every 6 (six) hours as needed for headache, mild pain or moderate pain.   Yes [provider]  HYDROcodone-acetaminophen (NORCO/VICODIN) 5-325 MG tablet Take 1-2 tablets by mouth every 6 (six) hours as needed for severe pain. 02/06/17   Skeeter Sheard, Dahlia Client, PA-C    Family History Family History  Problem Relation Age of Onset  . Hyperlipidemia Mother   . Hypertension Mother   . Hypertension Father   . Hyperlipidemia Father   . Diabetes Father   . Cancer Sister        ovarian  . Heart attack Neg Hx   . Sudden death Neg Hx     Social History Social History  Substance Use Topics  . Smoking status: Current Some Day Smoker    Packs/day: 0.25    Types: Cigarettes  . Smokeless tobacco: Never Used  . Alcohol use 4.2 oz/week    7 Glasses of wine per week     Comment: social     Allergies   Penicillins   Review of Systems Review of Systems  Constitutional:  Negative for chills and fever.  Gastrointestinal: Negative for nausea and vomiting.  Musculoskeletal: Positive for arthralgias, gait problem (secondary to pain ) and joint swelling. Negative for back pain, neck pain and neck stiffness.  Skin: Negative for wound.  Neurological: Negative for numbness.  Hematological: Does not bruise/bleed easily.  Psychiatric/Behavioral: The patient is not nervous/anxious.   All other systems reviewed and are negative.    Physical Exam Updated Vital Signs BP 129/82 (BP Location: Left Arm)   Pulse (!) 58   Temp 98.2 F (36.8 C) (Oral)   Resp 16   Ht 5\' 5"  (1.651 m)    Wt 83 kg (183 lb)   SpO2 100%   BMI 30.45 kg/m   Physical Exam  Constitutional: She appears well-developed and well-nourished. No distress.  HENT:  Head: Normocephalic and atraumatic.  Eyes: Conjunctivae are normal.  Neck: Normal range of motion.  Cardiovascular: Normal rate, regular rhythm and intact distal pulses.   Capillary refill < 3 sec  Pulmonary/Chest: Effort normal and breath sounds normal.  Musculoskeletal: She exhibits tenderness. She exhibits no edema.  Left knee: Somewhat decreased range of motion. A moderate joint effusion noted. No abnormal patellar movement or disruption of the patellar tendon. Mild tenderness to palpation along the lateral joint line. No erythema or increased warmth. Sensation intact to the left lower extremity. Strength 5/5 in the foot, ankle and knee.  Neurological: She is alert. Coordination normal.  Patient ambulatory with antalgic gait but able to weight-bear on the left knee.  Skin: Skin is warm and dry. She is not diaphoretic.  No tenting of the skin  Psychiatric: She has a normal mood and affect.  Nursing note and vitals reviewed.    ED Treatments / Results   Radiology Dg Knee Complete 4 Views Left  Result Date: 02/06/2017 CLINICAL DATA:  Worsening knee pain EXAM: LEFT KNEE - COMPLETE 4+ VIEW COMPARISON:  09/13/2016, 07/23/2016 FINDINGS: No acute fracture or malalignment. Moderate-to-marked arthritis of the medial compartment, increased compared to prior radiograph. Mild to moderate patellofemoral degenerative changes with bony spurring. No large effusion. IMPRESSION: 1. No acute osseous abnormality 2. Arthritis of the left knee, most marked involving the medial compartment. Electronically Signed   By: Jasmine PangKim  Fujinaga M.D.   On: 02/06/2017 01:32    Procedures Procedures (including critical care time)  Medications Ordered in ED Medications  HYDROcodone-acetaminophen (NORCO/VICODIN) 5-325 MG per tablet 1 tablet (not administered)      Initial Impression / Assessment and Plan / ED Course  I have reviewed the triage vital signs and the nursing notes.  Pertinent labs & imaging results that were available during my care of the patient were reviewed by me and considered in my medical decision making (see chart for details).     Patient with increasing left knee pain. Joint effusion noted on exam but no evidence of septic joint. X-ray shows arthritis of the left knee most marked in the medial compartment. She has previously had arthroscopic surgery on this knee. Will give conservative therapies including pain control, knee sleeve, instructions to elevate and ice. I recommended the patient return to her crutches for several days to decrease the weight on her knee. She is to follow with her orthopedist early next week.  Final Clinical Impressions(s) / ED Diagnoses   Final diagnoses:  Acute pain of left knee    New Prescriptions New Prescriptions   HYDROCODONE-ACETAMINOPHEN (NORCO/VICODIN) 5-325 MG TABLET    Take 1-2 tablets by mouth every 6 (six) hours  as needed for severe pain.     Milissa Fesperman, Boyd Kerbs 02/06/17 0402    Molpus, Jonny Ruiz, MD 02/06/17 [phone]

## 2017-02-06 NOTE — ED Triage Notes (Signed)
Brought in by EMS from home with c/o worsening left knee pain.  Pt reports that she had left knee surgery last February, and today, she "felt something torn in her knee"--- same symptom before she had the surgery.

## 2017-04-11 ENCOUNTER — Encounter: Payer: Self-pay | Admitting: Family Medicine

## 2017-04-11 ENCOUNTER — Ambulatory Visit (INDEPENDENT_AMBULATORY_CARE_PROVIDER_SITE_OTHER): Payer: Managed Care, Other (non HMO) | Admitting: Family Medicine

## 2017-04-11 VITALS — BP 102/80 | HR 55 | Ht 65.0 in | Wt 191.7 lb

## 2017-04-11 DIAGNOSIS — K5900 Constipation, unspecified: Secondary | ICD-10-CM | POA: Diagnosis not present

## 2017-04-11 DIAGNOSIS — Z7689 Persons encountering health services in other specified circumstances: Secondary | ICD-10-CM

## 2017-04-11 DIAGNOSIS — K219 Gastro-esophageal reflux disease without esophagitis: Secondary | ICD-10-CM | POA: Diagnosis not present

## 2017-04-11 DIAGNOSIS — F339 Major depressive disorder, recurrent, unspecified: Secondary | ICD-10-CM | POA: Diagnosis not present

## 2017-04-11 DIAGNOSIS — Z1211 Encounter for screening for malignant neoplasm of colon: Secondary | ICD-10-CM | POA: Diagnosis not present

## 2017-04-11 MED ORDER — OMEPRAZOLE 20 MG PO CPDR: 20.0000 mg | DELAYED_RELEASE_CAPSULE | Freq: Every day | ORAL | 3 refills | Status: DC

## 2017-04-11 MED ORDER — POLYETHYLENE GLYCOL 3350 17 GM/SCOOP PO POWD: 17.0000 g | Freq: Every day | ORAL | 1 refills | Status: DC
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
## 2017-04-11 NOTE — Patient Instructions (Addendum)
Cancer Screening for Women A cancer screening is a test or exam that checks for cancer. Your health care provider will recommend specific cancer screenings based on your age, personal history, and family history of cancer. Work with your health care provider to create a cancer screening schedule that protects your health. Why is cancer screening done? Cancer screening is done to look for cancer in the very early stages, before it spreads and becomes harder to treat and before you would start to notice symptoms. Finding cancer early improves the chances of successful treatment. It may save your life. Who should be screened for cancer? All women should be screened for certain cancers, including breast cancer, cervical cancer, and skin cancer. Your health care provider may recommend screenings for other types of cancer if:  You had cancer before.  You have a family member with cancer.  You have abnormal genes that could increase the risk of cancer.  You have risk factors for certain cancers, such as smoking.  When you should be screened for cancer depends on:  Your age.  Your medical history and your family's medical history.  Certain lifestyle factors, such as smoking.  Environmental exposure, such as to asbestos.  What are some common cancer screenings? Breast cancer Breast cancer screening is done with a test that takes images of breast tissue (mammogram). Here are some screening guidelines:  When you are age 40-44. you will be given the choice to start having mammograms.  When you are age 45-54, you should have a mammogram every year.  You may start having mammograms before age 45 if you have risk factors for breast cancer, such as having an immediate family member with breast cancer.  When you are age 55 or older, you should have a mammogram every 1-2 years for as long as you are in good health and have a life expectancy of 10 years or more.  It is important to know what your  breasts look and feel like so you can report any changes to your health care provider.  Cervical cancer Cervical cancer screening is done with a Pap test. This testchecks for abnormalities, including the virus that causes cervical cancer (human papillomavirus, or HPV). To perform the test, a health care provider takes a swab of cervical cells during a pelvic exam. Screening for cervical cancer with a Pap test should start at age 21. Here are some screening guidelines:  When you are age 21-29, you should have a Pap test every 3 years.  When you are age 30-65, you should have a Pap test and HPV test every 5 years or have a Pap test every 3 years.  You may be screened for cervical cancer more often if you have risk factors for cervical cancer.  If your Pap tests are abnormal, you may have an HPV test.  If you have had the HPV vaccine, you will still be screened for cervical cancer and follow normal screening recommendations.  You do not need to be screened for cervical cancer if any of the following apply to you:  You are older than age 65 and you have not had a serious cervical precancer or cancer in the last 20 years.  Your cervix and uterus have been removed and you have never had cervical cancer or precancerous cells.  Endometrial cancer There is no standard screening test for endometrial cancer, but the cancer can be detected with:  A test of a sample of tissue taken from the lining of   the uterus (endometrial tissue biopsy).  A vaginal ultrasound.  Pap tests.  If you are at increased risk for endometrial cancer, you may need to have these tests more often than normal. You are at increased risk if:  You have a family history of ovarian, uterine, or colon cancer.  You are taking tamoxifen, a drug that is used to treat breast cancer.  You have certain types of colon cancer.  If you have reached menopause, it is especially important to talk with your health care provider about any  vaginal bleeding or spotting. Screening for endometrial cancer is not recommended for women who do not have symptoms of the cancer, such as vaginal bleeding. Colorectal cancer Screening for colorectal cancer is recommended starting at age 50 for most women. If you have a family history of colon or rectal cancer or other risk factors, you may need to start having screenings earlier. Talk with your health care provider about which screening test is right for you and how often you should be screened. Colorectal cancer screening looks for cancer or for growths called polyps that often form before cancer starts. Tests to look for cancer or polyps include:  Colonoscopy or flexible sigmoidoscopy. For these procedures, a flexible tube with a small camera is inserted into the rectum.  CT colonography. This test uses X-rays and a contrast dye to check the colon for polyps. If a polyp is found, you may need to have a colonoscopy so the polyp can be located and removed.  Tests to look for cancer in the stool (feces) include:  Guaiac-based fecal occult blood test (FOBT). This test detects blood in stool. It can be done at home with a kit.  Fecal immunochemical test (FIT). This test detects blood in stool. For this test, you will need to collect stool samples at home.  Stool DNA test. This test looks for blood in stool and any changes in DNA that can lead to colon cancer. For this test, you will need to collect a stool sample at home and send it to a lab.  Skin cancer Skin cancer screening is done by checking the skin for unusual moles or spots and any changes in existing moles. Your health care provider should check your skin for signs of skin cancer at every physical exam. You should check your skin every month and tell your health care provider right away if anything looks unusual. Women with a higher-than-normal risk for skin cancer may want to see a skin specialist (dermatologist) for an annual body  check. Lung cancer Lung cancer screening is done with a CT scan that looks for abnormal cells in the lungs. Discuss lung cancer screening with your health care provider if you are 55-74 years old and if any of the following apply to you:  You currently smoke.  You used to smoke heavily.  You have had at least a 30-pack-year smoking history.  You have quit smoking within the past 15 years.  If you smoke heavily or if you used to smoke, you may need to be screened every year. Where to find more information:  National Cancer Institute: https://www.cancer.gov/about-cancer/screening  Centers for Disease Control and Prevention: https://www.cdc.gov/cancer/dcpc/prevention/screening.htm  Department of Health and Human Services: https://www.womenshealth.gov/screening-tests-and-vaccines/screening-tests-for-women Contact a health care provider if:  You have concerns about any signs or symptoms of cancer, such as: ? Moles that have an unusual shape or color. ? Changes in existing moles. ? A sore on your skin that does not heal. ? Blood   in your stool. ? Fatigue that does not go away. ? Frequent pain or cramping in your abdomen. ? Coughing, or coughing up blood. ? Losing weight without trying. ? Lumps or other changes in your breasts. ? Vaginal bleeding, spotting, or changes in your periods. Summary  Be aware of and watch for signs and symptoms of cancer, especially symptoms of breast cancer, cervical cancer, endometrial cancer, colorectal cancer, skin cancer, and lung cancer.  Early detection of cancer with cancer screening may save your life.  Talk with your health care provider about your specific cancer risks.  Work together with your health care provider to create a cancer screening plan that is right for you. This information is not intended to replace advice given to you by your health care provider. Make sure you discuss any questions you have with your health care  provider. Document Released: 04/08/2016 Document Revised: 04/08/2016 Document Reviewed: 04/08/2016 Elsevier Interactive Patient Education  2018 [employer].  Constipation, Adult Constipation is when a person:  Poops (has a bowel movement) fewer times in a week than normal.  Has a hard time pooping.  Has poop that is dry, hard, or bigger than normal.  Follow these instructions at home: Eating and drinking   Eat foods that have a lot of fiber, such as: ? Fresh fruits and vegetables. ? Whole grains. ? Beans.  Eat less of foods that are high in fat, low in fiber, or overly processed, such as: ? [place] fries. ? Hamburgers. ? Cookies. ? Candy. ? Soda.  Drink enough fluid to keep your pee (urine) clear or pale yellow. General instructions  Exercise regularly or as told by your doctor.  Go to the restroom when you feel like you need to poop. Do not hold it in.  Take over-the-counter and prescription medicines only as told by your doctor. These include any fiber supplements.  Do pelvic floor retraining exercises, such as: ? Doing deep breathing while relaxing your lower belly (abdomen). ? Relaxing your pelvic floor while pooping.  Watch your condition for any changes.  Keep all follow-up visits as told by your doctor. This is important. Contact a doctor if:  You have pain that gets worse.  You have a fever.  You have not pooped for 4 days.  You throw up (vomit).  You are not hungry.  You lose weight.  You are bleeding from the anus.  You have thin, pencil-like poop (stool). Get help right away if:  You have a fever, and your symptoms suddenly get worse.  You leak poop or have blood in your poop.  Your belly feels hard or bigger than normal (is bloated).  You have very bad belly pain.  You feel dizzy or you faint. This information is not intended to replace advice given to you by your health care provider. Make sure you discuss any questions you have  with your health care provider. Document Released: 12/29/2007 Document Revised: 01/30/2016 Document Reviewed: 12/31/2015 Elsevier Interactive Patient Education  2017 [place] for Gastroesophageal Reflux Disease, Adult When you have gastroesophageal reflux disease (GERD), the foods you eat and your eating habits are very important. Choosing the right foods can help ease your discomfort. What guidelines do I need to follow?  Choose fruits, vegetables, whole grains, and low-fat dairy products.  Choose low-fat meat, fish, and poultry.  Limit fats such as oils, salad dressings, butter, nuts, and avocado.  Keep a food diary. This helps you identify foods that cause  symptoms.  Avoid foods that cause symptoms. These may be different for everyone.  Eat small meals often instead of 3 large meals a day.  Eat your meals slowly, in a place where you are relaxed.  Limit fried foods.  Cook foods using methods other than frying.  Avoid drinking alcohol.  Avoid drinking large amounts of liquids with your meals.  Avoid bending over or lying down until 2-3 hours after eating. What foods are not recommended? These are some foods and drinks that may make your symptoms worse: Vegetables Tomatoes. Tomato juice. Tomato and spaghetti sauce. Chili peppers. Onion and garlic. Horseradish. Fruits Oranges, grapefruit, and lemon (fruit and juice). Meats High-fat meats, fish, and poultry. This includes hot dogs, ribs, ham, sausage, salami, and bacon. Dairy Whole milk and chocolate milk. Sour cream. Cream. Butter. Ice cream. Cream cheese. Drinks Coffee and tea. Bubbly (carbonated) drinks or energy drinks. Condiments Hot sauce. Barbecue sauce. Sweets/Desserts Chocolate and cocoa. Donuts. Peppermint and spearmint. Fats and Oils High-fat foods. This includes [place] fries and potato chips. Other Vinegar. Strong spices. This includes black pepper, white pepper, red pepper, cayenne,  curry powder, cloves, ginger, and chili powder. The items listed above may not be a complete list of foods and drinks to avoid. Contact your dietitian for more information. This information is not intended to replace advice given to you by your health care provider. Make sure you discuss any questions you have with your health care provider. Document Released: 01/11/2012 Document Revised: 12/18/2015 Document Reviewed: 05/16/2013 Elsevier Interactive Patient Education  2017 [employer].

## 2017-04-18 ENCOUNTER — Ambulatory Visit (INDEPENDENT_AMBULATORY_CARE_PROVIDER_SITE_OTHER): Payer: Managed Care, Other (non HMO) | Admitting: Family Medicine

## 2017-04-18 ENCOUNTER — Encounter: Payer: Self-pay | Admitting: Family Medicine

## 2017-04-18 ENCOUNTER — Other Ambulatory Visit: Payer: Self-pay | Admitting: Family Medicine

## 2017-04-18 ENCOUNTER — Telehealth: Payer: Self-pay | Admitting: Family Medicine

## 2017-04-18 VITALS — BP 120/80 | HR 59 | Temp 98.4°F | Wt 194.7 lb

## 2017-04-18 DIAGNOSIS — R300 Dysuria: Secondary | ICD-10-CM

## 2017-04-18 DIAGNOSIS — Z01818 Encounter for other preprocedural examination: Secondary | ICD-10-CM | POA: Diagnosis not present

## 2017-04-18 LAB — POC URINALSYSI DIPSTICK (AUTOMATED)
BILIRUBIN UA: NEGATIVE
GLUCOSE UA: NEGATIVE
KETONES UA: NEGATIVE
LEUKOCYTES UA: NEGATIVE
Nitrite, UA: NEGATIVE
PROTEIN UA: NEGATIVE
Urobilinogen, UA: 0.2 E.U./dL
pH, UA: 5.5 (ref 5.0–8.0)

## 2017-04-18 MED ORDER — FOSFOMYCIN TROMETHAMINE 3 G PO PACK: 3.0000 g | PACK | Freq: Once | ORAL | 0 refills | Status: DC

## 2017-04-18 MED ORDER — SULFAMETHOXAZOLE-TRIMETHOPRIM 800-160 MG PO TABS: 1.0000 | ORAL_TABLET | Freq: Two times a day (BID) | ORAL | 0 refills | Status: AC

## 2017-04-18 NOTE — Telephone Encounter (Signed)
Please advise 

## 2017-04-18 NOTE — Patient Instructions (Addendum)
Dysuria Dysuria is pain or discomfort while urinating. The pain or discomfort may be felt in the tube that carries urine out of the bladder (urethra) or in the surrounding tissue of the genitals. The pain may also be felt in the groin area, lower abdomen, and lower back. You may have to urinate frequently or have the sudden feeling that you have to urinate (urgency). Dysuria can affect both men and women, but is more common in women. Dysuria can be caused by many different things, including:  Urinary tract infection in women.  Infection of the kidney or bladder.  Kidney stones or bladder stones.  Certain sexually transmitted infections (STIs), such as chlamydia.  Dehydration.  Inflammation of the vagina.  Use of certain medicines.  Use of certain soaps or scented products that cause irritation.  Follow these instructions at home: Watch your dysuria for any changes. The following actions may help to reduce any discomfort you are feeling:  Drink enough fluid to keep your urine clear or pale yellow.  Empty your bladder often. Avoid holding urine for long periods of time.  After a bowel movement or urination, women should cleanse from front to back, using each tissue only once.  Empty your bladder after sexual intercourse.  Take medicines only as directed by your health care provider.  If you were prescribed an antibiotic medicine, finish it all even if you start to feel better.  Avoid caffeine, tea, and alcohol. They can irritate the bladder and make dysuria worse. In men, alcohol may irritate the prostate.  Keep all follow-up visits as directed by your health care provider. This is important.  If you had any tests done to find the cause of dysuria, it is your responsibility to obtain your test results. Ask the lab or department performing the test when and how you will get your results. Talk with your health care provider if you have any questions about your results.  Contact a  health care provider if:  You develop pain in your back or sides.  You have a fever.  You have nausea or vomiting.  You have blood in your urine.  You are not urinating as often as you usually do. Get help right away if:  You pain is severe and not relieved with medicines.  You are unable to hold down any fluids.  You or someone else notices a change in your mental function.  You have a rapid heartbeat at rest.  You have shaking or chills.  You feel extremely weak. This information is not intended to replace advice given to you by your health care provider. Make sure you discuss any questions you have with your health care provider. Document Released: 04/09/2004 Document Revised: 12/18/2015 Document Reviewed: 03/07/2014 Elsevier Interactive Patient Education  2018 [employer].  Preparing for Knee Replacement Recovery from knee replacement surgery can be made easier and more comfortable by being prepared before surgery. This includes:  Arranging for others to help you.  Preparing your home.  Preparing your body by getting a preoperative exam and being as healthy as you can.  Doing exercises before your surgery as directed by your health care provider.  You can ease any concerns about your financial responsibilities by calling your insurance company after you decide to have surgery. In addition to asking about your surgery and hospital stay, you will want to ask about coverage for medical equipment, rehabilitation facilities, and home care. How should I arrange for help? You will be stronger and  more mobile every day. However, in the first couple weeks after surgery, it is unlikely you will be able to do all your daily activities as easily as before your surgery. You may tire easily and will still have limited movement in your leg. Follow these guidelines to best arrange for the help you may need after your surgery:  Plan to have someone take you home after the procedure.  Your health care provider will be able to tell you how many days you can expect to be in the hospital.  Cancel all work, caregiving, and volunteer responsibilities for at least 4-6 weeks after your surgery.  If you live alone, arrange for someone to care for your home and pets for the first 4-6 weeks after surgery.  Select someone with whom you feel comfortable to be with you day and night for the first week. This person will help you with your exercises and personal care, such as bathing and using the toilet.  Arrange for drivers to bring you to and from your follow-up appointments, the grocery store, and other places you may need to go for at least 4-6 weeks.  How should I prepare my home?  Pick a recovery spot, but do not plan on recovering in bed. Sitting upright is better for your health. You may want to use a recliner with a small table nearby. Place the items you use most frequently on that table. These may include the TV remote, a cordless phone, a book or laptop computer, a water glass, and any other items of your choice.  Remove all clutter from your floors. Also remove any throw rugs.  To see if you will be able to move in your home with a wheeled walker, hold your hands out about 6 inches (15 cm) from your sides. Walk from your recovery spot to your kitchen and bathroom. Then walk from your bed to the bathroom. If you do not hit anything with your hands, you have enough room.  Move the items you use most often in your kitchen, bathroom, and bedroom to shelves and drawers that are at countertop height.  Prepare a few meals to freeze and reheat later.  Consider adding grab bars in the shower and near the toilet.  While you are in the hospital, you will learn about equipment that can be helpful for your recovery. Some of the equipment includes raised toilet seats, tub benches, and shower benches. How should I prepare my body?  Have a preoperative exam. This will ensure that your  body is healthy enough to safely have this surgery. Bring a complete list of all your medicines and supplements, including herbs and vitamins. You may need to have additional tests to ensure your safety.  Have elective dental care and routine cleanings completed before your surgery. Germs from anywhere in your body, including your mouth, can travel to your new joint and infect it. It is important not to have any dental work performed for at least 3 months after your surgery. After surgery, be sure to tell your dentist about your joint replacement.  Maintain a healthy diet. Do not change your diet before surgery unless advised to do so by your health care provider.  Do not use any tobacco products, including cigarettes, chewing tobacco, or electronic cigarettes. If you need help quitting, ask your health care provider. Tobacco and nicotine products can delay healing after your surgery.  The day before your surgery, follow your health care provider's directions for showering, eating,  drinking, and taking medicines. These directions are for your safety. What kinds of exercises should I do? Your health care provider may have you do the following exercises before your surgery. Be sure to follow the exercise program only as directed by your health care provider. While completing these exercises, remember to stretch for as long as you can, up to 30 seconds. You should only feel a gentle lengthening or release in the stretched tissue. You should not feel pain. Ankle Pumps 1. While sitting on a firm surface with your legs straight out in front of you, move your feet at the ankle joints so that your toes are pulling back toward your chest. 2. Reverse the motion, pointing your toes away from you. 3. Repeat 10-20 times. Complete this exercise 1-2 times per day.  Heel Slides 1. Lie on your back with both knees straight. (If this causes back pain, bend one knee, placing your foot flat on the floor. Keep this leg  in this position while doing heel slides with the opposite leg.) 2. Slowly slide one heel back toward your buttocks until you feel a gentle stretch in the front of your knee or thigh. 3. Slowly slide your heel back to the starting position. 4. Repeat 10-20 times, then switch heels and do it again. Complete this exercise 1-2 times per day.  Quadriceps Sets 1. Lie on your back with one leg extended and your opposite knee bent. 2. Gradually tighten the muscles in the front of the thigh of your extended leg. This motion will push the back of the knee down toward the floor. 3. Hold the muscle as tightly as you can without causing pain for 10 seconds. 4. Relax the muscles slowly and completely in between each repetition. 5. Repeat 10-20 times, then switch legs and do it again. Complete this exercise 1-2 times per day.  Short Arc Kicks 1. Lie on your back. Place a rolled towel (4-6 inches [10-15 cm] high) under one knee so that the knee slightly bends. 2. Raise only the lower leg of your slightly bent leg by tightening the muscles in the front of your thigh. Do not allow your thigh to rise. 3. Hold this position for 5 seconds. 4. Repeat 10-20 times, then switch legs and do it again. Complete this exercise 1-2 times per day.  Straight Leg Raises 1. Lie on your back with one leg extended and your opposite knee bent. 2. Tighten the muscles in the front of the thigh of your extended leg. Your thigh may shake slightly. 3. Tighten these muscles even more and raise your leg 4-6 inches off the floor. Hold for 3-5 seconds. 4. Keeping these muscles tight, lower your leg. 5. Relax the muscles slowly and completely in between each repetition. 6. Repeat 10-20 times, then switch legs and do it again. Complete this exercise 1-2 times per day.  Arm Chair Push-ups 1. Find a firm, non-wheeled chair with solid armrests. 2. Sitting in the chair, extend one leg straight out in front of you. 3. Lift up your body  weight, using your arms and opposite leg. 4. Slowly lower your body weight. 5. Repeat 10-20 times, then switch legs and do it again. Complete this exercise 1-2 times per day.  This information is not intended to replace advice given to you by your health care provider. Make sure you discuss any questions you have with your health care provider. Document Released: 10/16/2010 Document Revised: 12/15/2015 Document Reviewed: 10/04/2013 [employer] Interactive Patient Education  2017 [employer]  

## 2017-04-18 NOTE — Progress Notes (Signed)
Chief Complaint  Patient presents with  . Follow-up    HPI:  Patient is seen for optimization of general medical care prior to surgery and for acute concern.   Acute: Dysuria.  Started a few days ago.  Endorses itching, burning, and odor.  Denies vaginal d/c, N/V, or back pain.  Surgery type: Date of surgery:05/13/17  Kidney disease? No Prior surgeries/Issues following anesthesia? Partial hysterectomy 2014, 2016 ORIF ulnar fx L arm, L knee arthroscopy with medial menisectomy, condroplasty, Cesarean section-1981.  No issues with anesthesia reported. Hx MI, heart arrythmia, CHF, angina or stroke? none Epilepsy or Seizures? none Arthritis or problems with neck or jaw? none Thyroid disease? none Liver disease? none Asthma, COPD or chronic lung disease? none Diabetes? none   Other: Poor nutrition, Frail or other: no  METS:  ?Can take care of self, such as eat, dress, or use the toilet (1 MET). yes ?Can walk up a flight of steps or a hill (4 METs).yes ?Can do heavy work around the house such as scrubbing floors or lifting or moving heavy furniture (between 4 and 10 METs). yes ?Can participate in strenuous sports such as swimming, singles tennis, football, basketball, and skiing (>10 METs) . AHA Risks: Major predictors that require intensive management and may lead to delay in or cancellation of the operative procedure unless emergent: NONE  . Unstable coronary syndromes including unstable or severe angina or recent MI  . Decompensated heart failure including NYHA functional class IV or worsening or new-onset HF  . Significant arrhythmias including high grade AV block, symptomatic ventricular arrhythmias, supraventricular arrhythmias with ventricular rate >100 bpm at rest, symptomatic bradycardia, and newly recognized ventricular tachycardia  . Severe heart valve disease including severe aortic stenosis or symptomatic mitral stenosis   Other clinical predictors that warrant careful  assessment of current status: NONE  . History of ischemic heart disease . History of cerebrovascular disease  . History of compensated heart failure or prior heart failure  . Diabetes mellitus  . Renal insufficiency  Type of surgery and Risk: 1) High risk (reported risk of cardiac death or nonfatal myocardial infarction [MI] often greater than 5 percent):  Marland Kitchen Aortic and other major vascular surgery  . Peripheral artery surgery   2)Intermediate risk (reported risk of cardiac death or nonfatal MI generally 1 to 5 percent):  Marland Kitchen Carotid endarterectomy  . Head and neck surgery  . Intraperitoneal and intrathoracic surgery  . Orthopedic surgery  . Prostate surgery   3)Low risk (reported risk of cardiac death or nonfatal MI generally less than 1 percent):  Marland Kitchen Ambulatory surgery  . Endoscopic procedures  . Superficial procedure  . Cataract surgery  . Breast surgery  Medications that need to be addressed prior to surgery: None Discontinue acei/arbs/non-statin lipid lowering drugs day of surgery ASA stop 7 days before or discuss with cardiology if CV risks, other anticoagulants discuss with cardiology.  ROS: See pertinent positives and negatives per HPI. 11 point ROS negative except where noted.   +L knee pain, urine odor, itching/burning with urination.  Denies vaginal d/c, N/V, or back pain.  Past Medical History:  Diagnosis Date  . Arthritis   . Chicken pox   . Depression   . History of meniscal tear    left  . Iron deficiency     Past Surgical History:  Procedure Laterality Date  . ABDOMINAL HYSTERECTOMY     partial  . CHONDROPLASTY Left 09/17/2016   Procedure: CHONDROPLASTY;  Surgeon: Earlie Server, MD;  Location: [place];  Service: Orthopedics;  Laterality: Left;  . COLONOSCOPY    . KNEE ARTHROSCOPY WITH MEDIAL MENISECTOMY Left 09/17/2016   Procedure: KNEE ARTHROSCOPY WITH PARTIAL MEDIAL MENISECTOMY;  Surgeon: Earlie Server, MD;  Location: [place];  Service: Orthopedics;  Laterality: Left;  . ORIF ULNAR FRACTURE Left 04/14/2015   Procedure: OPEN REDUCTION INTERNAL FIXATION (ORIF) ULNAR FRACTURE;  Surgeon: Marybelle Killings, MD;  Location: [place];  Service: Orthopedics;  Laterality: Left;    Family History  Problem Relation Age of Onset  . Hyperlipidemia Mother   . Hypertension Mother   . Hypertension Father   . Hyperlipidemia Father   . Diabetes Father   . Cancer Sister        ovarian  . Heart attack Neg Hx   . Sudden death Neg Hx     Social History   Social History  . Marital status: Married    Spouse name: N/A  . Number of children: N/A  . Years of education: N/A   Social History Main Topics  . Smoking status: Current Some Day Smoker    Packs/day: 0.25    Types: Cigarettes  . Smokeless tobacco: Never Used  . Alcohol use 4.2 oz/week    7 Glasses of wine per week     Comment: social  . Drug use: No  . Sexual activity: Not Asked   Other Topics Concern  . None   Social History Narrative  . None     Current Outpatient Prescriptions:  .  omeprazole (PRILOSEC) 20 MG capsule, Take 1 capsule (20 mg total) by mouth daily., Disp: 30 capsule, Rfl: 3 .  polyethylene glycol powder (GLYCOLAX/MIRALAX) powder, Take 17 g by mouth daily., Disp: 3350 g, Rfl: 1 .  fosfomycin (MONUROL) 3 g PACK, Take 3 g by mouth once., Disp: 3 g, Rfl: 0  EXAM:  Vitals:   04/18/17 0813  BP: 120/80  Pulse: (!) 59  Temp: 98.4 F (36.9 C)    Body mass index is 32.4 kg/m.  GENERAL: vitals reviewed and listed above, alert, oriented, appears well hydrated and in no acute distress  HEENT: atraumatic, conjunttiva clear, no obvious abnormalities on inspection of external nose and ears  NECK: no obvious masses on inspection, no carotid bruits  LUNGS: clear to auscultation bilaterally, no wheezes, rales or rhonchi, good air movement  CV: HRRR, no peripheral edema, no JVD, BP normal range, normal radial pulses  MS: moves all  extremities without noticeable abnormality  GU: Normal external female genitalia, no erythema or d/c noted.  Pelvic exam deferred.   UA with 1+ blood.  PSYCH: pleasant and cooperative, no obvious depression or anxiety  ASSESSMENT AND PLAN:  Discussed the following assessment and plan:  Dysuria  -discussed various causes including UTI, vaginitis. - Plan: POCT Urinalysis Dipstick (Automated), Urine Culture, fosfomycin (MONUROL) 3 g PACK  Preop examination Assessment: -Risk factors: none -Surgery Risks:intermediate -age, nutritional status, fraility: good nutritional status, age <65, no fraility -functional capacity: > 4 METs without symptoms -comorbidities: none Patient Specific Risks: patient is low risk for intermediate risks surgery   Recommendations for optimizing general medical care prior to surgery: -advised patient to discuss specific risks morbidity and mortality of surgery with surgeon, CV risks discussed with patient -advised patient will defer to surgeon for post-op DVT prophylaxis and post op care -no specific medical recommendations for this patient at this time and no recommendations to defer surgery or for further CV testing prior  to surgery -form for pre-op optimization of general medical care prior to surgery faxed to surgeon office  -Patient advised to return or notify a doctor immediately if symptoms worsen or persist or new concerns arise.  Patient Instructions  Dysuria Dysuria is pain or discomfort while urinating. The pain or discomfort may be felt in the tube that carries urine out of the bladder (urethra) or in the surrounding tissue of the genitals. The pain may also be felt in the groin area, lower abdomen, and lower back. You may have to urinate frequently or have the sudden feeling that you have to urinate (urgency). Dysuria can affect both men and women, but is more common in women. Dysuria can be caused by many different things, including:  Urinary  tract infection in women.  Infection of the kidney or bladder.  Kidney stones or bladder stones.  Certain sexually transmitted infections (STIs), such as chlamydia.  Dehydration.  Inflammation of the vagina.  Use of certain medicines.  Use of certain soaps or scented products that cause irritation.  Follow these instructions at home: Watch your dysuria for any changes. The following actions may help to reduce any discomfort you are feeling:  Drink enough fluid to keep your urine clear or pale yellow.  Empty your bladder often. Avoid holding urine for long periods of time.  After a bowel movement or urination, women should cleanse from front to back, using each tissue only once.  Empty your bladder after sexual intercourse.  Take medicines only as directed by your health care provider.  If you were prescribed an antibiotic medicine, finish it all even if you start to feel better.  Avoid caffeine, tea, and alcohol. They can irritate the bladder and make dysuria worse. In men, alcohol may irritate the prostate.  Keep all follow-up visits as directed by your health care provider. This is important.  If you had any tests done to find the cause of dysuria, it is your responsibility to obtain your test results. Ask the lab or department performing the test when and how you will get your results. Talk with your health care provider if you have any questions about your results.  Contact a health care provider if:  You develop pain in your back or sides.  You have a fever.  You have nausea or vomiting.  You have blood in your urine.  You are not urinating as often as you usually do. Get help right away if:  You pain is severe and not relieved with medicines.  You are unable to hold down any fluids.  You or someone else notices a change in your mental function.  You have a rapid heartbeat at rest.  You have shaking or chills.  You feel extremely weak. This  information is not intended to replace advice given to you by your health care provider. Make sure you discuss any questions you have with your health care provider. Document Released: 04/09/2004 Document Revised: 12/18/2015 Document Reviewed: 03/07/2014 Elsevier Interactive Patient Education  2018 [employer].  Preparing for Knee Replacement Recovery from knee replacement surgery can be made easier and more comfortable by being prepared before surgery. This includes:  Arranging for others to help you.  Preparing your home.  Preparing your body by getting a preoperative exam and being as healthy as you can.  Doing exercises before your surgery as directed by your health care provider.  You can ease any concerns about your financial responsibilities by calling your insurance company after  you decide to have surgery. In addition to asking about your surgery and hospital stay, you will want to ask about coverage for medical equipment, rehabilitation facilities, and home care. How should I arrange for help? You will be stronger and more mobile every day. However, in the first couple weeks after surgery, it is unlikely you will be able to do all your daily activities as easily as before your surgery. You may tire easily and will still have limited movement in your leg. Follow these guidelines to best arrange for the help you may need after your surgery:  Plan to have someone take you home after the procedure. Your health care provider will be able to tell you how many days you can expect to be in the hospital.  Cancel all work, caregiving, and volunteer responsibilities for at least 4-6 weeks after your surgery.  If you live alone, arrange for someone to care for your home and pets for the first 4-6 weeks after surgery.  Select someone with whom you feel comfortable to be with you day and night for the first week. This person will help you with your exercises and personal care, such as bathing  and using the toilet.  Arrange for drivers to bring you to and from your follow-up appointments, the grocery store, and other places you may need to go for at least 4-6 weeks.  How should I prepare my home?  Pick a recovery spot, but do not plan on recovering in bed. Sitting upright is better for your health. You may want to use a recliner with a small table nearby. Place the items you use most frequently on that table. These may include the TV remote, a cordless phone, a book or laptop computer, a water glass, and any other items of your choice.  Remove all clutter from your floors. Also remove any throw rugs.  To see if you will be able to move in your home with a wheeled walker, hold your hands out about 6 inches (15 cm) from your sides. Walk from your recovery spot to your kitchen and bathroom. Then walk from your bed to the bathroom. If you do not hit anything with your hands, you have enough room.  Move the items you use most often in your kitchen, bathroom, and bedroom to shelves and drawers that are at countertop height.  Prepare a few meals to freeze and reheat later.  Consider adding grab bars in the shower and near the toilet.  While you are in the hospital, you will learn about equipment that can be helpful for your recovery. Some of the equipment includes raised toilet seats, tub benches, and shower benches. How should I prepare my body?  Have a preoperative exam. This will ensure that your body is healthy enough to safely have this surgery. Bring a complete list of all your medicines and supplements, including herbs and vitamins. You may need to have additional tests to ensure your safety.  Have elective dental care and routine cleanings completed before your surgery. Germs from anywhere in your body, including your mouth, can travel to your new joint and infect it. It is important not to have any dental work performed for at least 3 months after your surgery. After surgery, be  sure to tell your dentist about your joint replacement.  Maintain a healthy diet. Do not change your diet before surgery unless advised to do so by your health care provider.  Do not use any tobacco products, including  cigarettes, chewing tobacco, or electronic cigarettes. If you need help quitting, ask your health care provider. Tobacco and nicotine products can delay healing after your surgery.  The day before your surgery, follow your health care provider's directions for showering, eating, drinking, and taking medicines. These directions are for your safety. What kinds of exercises should I do? Your health care provider may have you do the following exercises before your surgery. Be sure to follow the exercise program only as directed by your health care provider. While completing these exercises, remember to stretch for as long as you can, up to 30 seconds. You should only feel a gentle lengthening or release in the stretched tissue. You should not feel pain. Ankle Pumps 1. While sitting on a firm surface with your legs straight out in front of you, move your feet at the ankle joints so that your toes are pulling back toward your chest. 2. Reverse the motion, pointing your toes away from you. 3. Repeat 10-20 times. Complete this exercise 1-2 times per day.  Heel Slides 1. Lie on your back with both knees straight. (If this causes back pain, bend one knee, placing your foot flat on the floor. Keep this leg in this position while doing heel slides with the opposite leg.) 2. Slowly slide one heel back toward your buttocks until you feel a gentle stretch in the front of your knee or thigh. 3. Slowly slide your heel back to the starting position. 4. Repeat 10-20 times, then switch heels and do it again. Complete this exercise 1-2 times per day.  Quadriceps Sets 1. Lie on your back with one leg extended and your opposite knee bent. 2. Gradually tighten the muscles in the front of the thigh of  your extended leg. This motion will push the back of the knee down toward the floor. 3. Hold the muscle as tightly as you can without causing pain for 10 seconds. 4. Relax the muscles slowly and completely in between each repetition. 5. Repeat 10-20 times, then switch legs and do it again. Complete this exercise 1-2 times per day.  Short Arc Kicks 1. Lie on your back. Place a rolled towel (4-6 inches [10-15 cm] high) under one knee so that the knee slightly bends. 2. Raise only the lower leg of your slightly bent leg by tightening the muscles in the front of your thigh. Do not allow your thigh to rise. 3. Hold this position for 5 seconds. 4. Repeat 10-20 times, then switch legs and do it again. Complete this exercise 1-2 times per day.  Straight Leg Raises 1. Lie on your back with one leg extended and your opposite knee bent. 2. Tighten the muscles in the front of the thigh of your extended leg. Your thigh may shake slightly. 3. Tighten these muscles even more and raise your leg 4-6 inches off the floor. Hold for 3-5 seconds. 4. Keeping these muscles tight, lower your leg. 5. Relax the muscles slowly and completely in between each repetition. 6. Repeat 10-20 times, then switch legs and do it again. Complete this exercise 1-2 times per day.  Arm Chair Push-ups 1. Find a firm, non-wheeled chair with solid armrests. 2. Sitting in the chair, extend one leg straight out in front of you. 3. Lift up your body weight, using your arms and opposite leg. 4. Slowly lower your body weight. 5. Repeat 10-20 times, then switch legs and do it again. Complete this exercise 1-2 times per day.  This information is  not intended to replace advice given to you by your health care provider. Make sure you discuss any questions you have with your health care provider. Document Released: 10/16/2010 Document Revised: 12/15/2015 Document Reviewed: 10/04/2013 Elsevier Interactive Patient Education  2017 [employer].     Lauren Boyle

## 2017-04-18 NOTE — Telephone Encounter (Signed)
Spoke with patient  Regarding different medication sent to the pharmacy. Advised patient that if their were anymore problems to give the office a call back. Patient understood and had no further questions.

## 2017-04-18 NOTE — Addendum Note (Signed)
Addended by: Charna Elizabeth on: 04/18/2017 09:23 AM   Modules accepted: Orders

## 2017-04-18 NOTE — Telephone Encounter (Signed)
Pt calling back stating that the medication that was called in is not covered by insurance and would like to see if something else can be called in.   [facility] on [address]

## 2017-04-19 ENCOUNTER — Telehealth: Payer: Self-pay | Admitting: Emergency Medicine

## 2017-04-19 NOTE — Telephone Encounter (Signed)
Spoke with pharmacist to make sure that patient antibiotic is ready for pickup. Pharmacist confirmed. Spoke with patient to clarify that she is using [facility] patient confirmed and stated that she is going to pick up rx today. Nothing further needed.

## 2017-04-21 LAB — URINE CULTURE
MICRO NUMBER:: 81054282
SPECIMEN QUALITY: ADEQUATE

## 2017-04-23 ENCOUNTER — Ambulatory Visit: Payer: Self-pay | Admitting: Physician Assistant

## 2017-04-23 NOTE — H&P (Signed)
TOTAL KNEE ADMISSION H&P  Patient is being admitted for left total knee arthroplasty.  Subjective:  Chief Complaint:left knee pain.  HPI: Lauren Boyle, 51 y.o. female, has a history of pain and functional disability in the left knee due to arthritis and has failed non-surgical conservative treatments for greater than 12 weeks to includeNSAID's and/or analgesics, corticosteriod injections, viscosupplementation injections and activity modification.  Onset of symptoms was gradual, starting 5 years ago with gradually worsening course since that time. The patient noted prior procedures on the knee to include  arthroscopy and menisectomy on the left knee(s).  Patient currently rates pain in the left knee(s) at 8 out of 10 with activity. Patient has night pain, worsening of pain with activity and weight bearing, pain that interferes with activities of daily living, pain with passive range of motion, crepitus and joint swelling.  Patient has evidence of joint space narrowing, periarticular osteophytes by imaging studies. There is no active infection.  Patient Active Problem List   Diagnosis Date Noted  . Adrenal mass, right (HCC) 05/27/2014  . Low back pain 07/04/2013   Past Medical History:  Diagnosis Date  . Arthritis   . Chicken pox   . Depression   . History of meniscal tear    left  . Iron deficiency     Past Surgical History:  Procedure Laterality Date  . ABDOMINAL HYSTERECTOMY     partial  . CHONDROPLASTY Left 09/17/2016   Procedure: CHONDROPLASTY;  Surgeon: Frederico Hamman, MD;  Location: [facility];  Service: Orthopedics;  Laterality: Left;  . COLONOSCOPY    . KNEE ARTHROSCOPY WITH MEDIAL MENISECTOMY Left 09/17/2016   Procedure: KNEE ARTHROSCOPY WITH PARTIAL MEDIAL MENISECTOMY;  Surgeon: Frederico Hamman, MD;  Location: [facility];  Service: Orthopedics;  Laterality: Left;  . ORIF ULNAR FRACTURE Left 04/14/2015   Procedure: OPEN REDUCTION INTERNAL  FIXATION (ORIF) ULNAR FRACTURE;  Surgeon: Eldred Manges, MD;  Location[facility];  Service: Orthopedics;  Laterality: Left;     (Not in a hospital admission) Allergies  Allergen Reactions  . Penicillins Anaphylaxis and Itching    Has patient had a PCN reaction causing immediate rash, facial/tongue/throat swelling, SOB or lightheadedness with hypotension: Yes. Has patient had a PCN reaction causing severe rash involving mucus membranes or skin necrosis: Yes Has patient had a PCN reaction that required hospitalization- No.- had to be given IV medication. Has patient had a PCN reaction occurring within the last 10 years: No. If all of the above answers are "NO", then may proceed with Cephalosporin use.     Social History  Substance Use Topics  . Smoking status: Current Some Day Smoker    Packs/day: 0.25    Types: Cigarettes  . Smokeless tobacco: Never Used  . Alcohol use 4.2 oz/week    7 Glasses of wine per week     Comment: social    Family History  Problem Relation Age of Onset  . Hyperlipidemia Mother   . Hypertension Mother   . Hypertension Father   . Hyperlipidemia Father   . Diabetes Father   . Cancer Sister        ovarian  . Heart attack Neg Hx   . Sudden death Neg Hx      Review of Systems  Gastrointestinal: Positive for constipation.  Musculoskeletal: Positive for joint pain.  All other systems reviewed and are negative.   Objective:  Physical Exam  Constitutional: She is oriented to person, place, and time. She  appears well-developed and well-nourished. No distress.  HENT:  Head: Normocephalic and atraumatic.  Nose: Nose normal.  Eyes: Pupils are equal, round, and reactive to light. Conjunctivae and EOM are normal.  Neck: Normal range of motion. Neck supple.  Cardiovascular: Normal rate, regular rhythm, normal heart sounds and intact distal pulses.   Respiratory: Effort normal. No respiratory distress. She has no wheezes.  GI: Soft. She exhibits no distension.  Bowel sounds are decreased. There is no tenderness.  Musculoskeletal:       Left knee: She exhibits decreased range of motion and swelling. Tenderness found.  Lymphadenopathy:    She has no cervical adenopathy.  Neurological: She is alert and oriented to person, place, and time. No cranial nerve deficit.  Skin: Skin is warm and dry. No rash noted. No erythema.  Psychiatric: She has a normal mood and affect. Her behavior is normal.    Vital signs in last 24 hours: @  Labs:   Estimated body mass index is 32.4 kg/m as calculated from the following:   Height as of 04/11/17:  (1.651 m).   Weight as of 04/18/17: 88.3 kg (194 lb 11.2 oz).   Imaging Review Plain radiographs demonstrate moderate degenerative joint disease of the left knee(s). The overall alignment ismild varus. The bone quality appears to be good for age and reported activity level.  Assessment/Plan:  End stage arthritis, left knee   The patient history, physical examination, clinical judgment of the provider and imaging studies are consistent with end stage degenerative joint disease of the left knee(s) and total knee arthroplasty is deemed medically necessary. The treatment options including medical management, injection therapy arthroscopy and arthroplasty were discussed at length. The risks and benefits of total knee arthroplasty were presented and reviewed. The risks due to aseptic loosening, infection, stiffness, patella tracking problems, thromboembolic complications and other imponderables were discussed. The patient acknowledged the explanation, agreed to proceed with the plan and consent was signed. Patient is being admitted for inpatient treatment for surgery, pain control, PT, OT, prophylactic antibiotics, VTE prophylaxis, progressive ambulation and ADL's and discharge planning. The patient is planning to be discharged home with home health services

## 2017-04-29 NOTE — Pre-Procedure Instructions (Addendum)
Jadamarie Butson  04/29/2017      [id number] - [place], [place] - [address] AT [facility] OF [facility] [address] [place] [address] Phone: [phone] Fax: [phone]  [id number] - [place], [place] - [address] [place] [address] Phone: [phone] Fax: [phone]    Your procedure is scheduled on Friday, 05/13/2017.  Report to [facility] Admitting at 0830 A.M.  Call this number if you have problems the morning of surgery:  [phone]   Remember:  Do not eat food or drink liquids after midnight.  Continue all other medications as directed by your physician except follow these medication instructions before surgery   Take these medicines the morning of surgery with A SIP OF WATER: Omeprazole (Prilosec)  7 days prior to surgery STOP taking any Aspirin (unless otherwise instructed by your surgeon), Aleve, Naproxen, Ibuprofen, Motrin, Advil, Goody's, BC's, all herbal medications, fish oil, and all vitamins    Do not wear jewelry, make-up or fingernail polish.  Do not wear lotions, powders, or perfumes, or deodorant.  Do not shave 48 hours prior to surgery.    Do not bring valuables to the hospital.  [facility] is not responsible for any belongings or valuables.  Contacts, eyeglasses, dentures or bridgework may not be worn into surgery.  Leave your suitcase in the car.  After surgery it may be brought to your room.  For patients admitted to the hospital, discharge time will be determined by your treatment team.  Patients discharged the day of surgery will not be allowed to drive home.   Name and phone number of your driver:    Special instructions:   [place]- Preparing For Surgery  Before surgery, you can play an important role. Because skin is not sterile, your skin needs to be as free of germs as possible. You can reduce the number of germs on your skin by  washing with CHG (chlorahexidine gluconate) Soap before surgery.  CHG is an antiseptic cleaner which kills germs and bonds with the skin to continue killing germs even after washing.  Please do not use if you have an allergy to CHG or antibacterial soaps. If your skin becomes reddened/irritated stop using the CHG.  Do not shave (including legs and underarms) for at least 48 hours prior to first CHG shower. It is OK to shave your face.  Please follow these instructions carefully.   1. Shower the NIGHT BEFORE SURGERY and the MORNING OF SURGERY with CHG.   2. If you chose to wash your hair, wash your hair first as usual with your normal shampoo.  3. After you shampoo, rinse your hair and body thoroughly to remove the shampoo.  4. Use CHG as you would any other liquid soap. You can apply CHG directly to the skin and wash gently with a scrungie or a clean washcloth.   5. Apply the CHG Soap to your body ONLY FROM THE NECK DOWN.  Do not use on open wounds or open sores. Avoid contact with your eyes, ears, mouth and genitals (private parts). Wash genitals (private parts) with your normal soap.  USE REGULAR SHAMPOO AND CONDITIONER FOR HAIR USE REGULAR SOAP FOR FACE AND PRIVATE AREA  6. Wash thoroughly, paying special attention to the area where your surgery will be performed.  7. Thoroughly rinse your body with warm water from the neck down.  8. DO NOT  shower/wash with your normal soap after using and rinsing off the CHG Soap.  9. Pat yourself dry with a CLEAN TOWEL and WASH CLOTH  10. Wear CLEAN PAJAMAS to bed the night before surgery, wear comfortable clothes the morning of surgery  11. Place CLEAN SHEETS on your bed the night of your first shower and DO NOT SLEEP WITH PETS.    Day of Surgery: Shower as stated above. Do not apply any deodorants/lotions. Please wear clean clothes to the hospital/surgery center.      Please read over the following fact sheets that you were  given. Pain Booklet, Coughing and Deep Breathing, Total Joint Packet, MRSA Information and Surgical Site Infection Prevention

## 2017-05-02 ENCOUNTER — Ambulatory Visit (HOSPITAL_COMMUNITY)
Admission: RE | Admit: 2017-05-02 | Discharge: 2017-05-02 | Disposition: A | Discharge status: Home / Self Care | Payer: Managed Care, Other (non HMO) | Source: Ambulatory Visit | Attending: Physician Assistant | Admitting: Physician Assistant

## 2017-05-02 ENCOUNTER — Encounter (HOSPITAL_COMMUNITY): Payer: Self-pay

## 2017-05-02 ENCOUNTER — Encounter (HOSPITAL_COMMUNITY)
Admission: RE | Admit: 2017-05-02 | Discharge: 2017-05-02 | Disposition: A | Discharge status: Home / Self Care | Payer: Managed Care, Other (non HMO) | Source: Ambulatory Visit | Attending: Orthopedic Surgery | Admitting: Orthopedic Surgery

## 2017-05-02 DIAGNOSIS — M1712 Unilateral primary osteoarthritis, left knee: Secondary | ICD-10-CM

## 2017-05-02 HISTORY — DX: Osteoarthritis of knee, unspecified: M17.9

## 2017-05-02 HISTORY — DX: Unilateral primary osteoarthritis, unspecified knee: M17.10

## 2017-05-02 LAB — COMPREHENSIVE METABOLIC PANEL
ALBUMIN: 3.7 g/dL (ref 3.5–5.0)
ALT: 15 U/L (ref 14–54)
AST: 17 U/L (ref 15–41)
Alkaline Phosphatase: 75 U/L (ref 38–126)
Anion gap: 8 (ref 5–15)
BUN: 9 mg/dL (ref 6–20)
CO2: 24 mmol/L (ref 22–32)
CREATININE: 0.73 mg/dL (ref 0.44–1.00)
Calcium: 9.3 mg/dL (ref 8.9–10.3)
Chloride: 107 mmol/L (ref 101–111)
GFR calc Af Amer: >60 mL/min (ref >60)
GLUCOSE: 96 mg/dL (ref 65–99)
POTASSIUM: 3.9 mmol/L (ref 3.5–5.1)
Sodium: 139 mmol/L (ref 135–145)
Total Bilirubin: 0.7 mg/dL (ref 0.3–1.2)
Total Protein: 7 g/dL (ref 6.5–8.1)

## 2017-05-02 LAB — SURGICAL PCR SCREEN
MRSA, PCR: NEGATIVE
Staphylococcus aureus: POSITIVE — AB

## 2017-05-02 LAB — URINALYSIS, ROUTINE W REFLEX MICROSCOPIC
BILIRUBIN URINE: NEGATIVE
GLUCOSE, UA: NEGATIVE mg/dL
Ketones, ur: NEGATIVE mg/dL
LEUKOCYTES UA: NEGATIVE
NITRITE: POSITIVE — AB
PH: 5 (ref 5.0–8.0)
Protein, ur: NEGATIVE mg/dL
SPECIFIC GRAVITY, URINE: 1.02 (ref 1.005–1.030)

## 2017-05-02 LAB — CBC WITH DIFFERENTIAL/PLATELET
BASOS ABS: 0.1 10*3/uL (ref 0.0–0.1)
BASOS PCT: 1 %
Eosinophils Absolute: 0.4 10*3/uL (ref 0.0–0.7)
Eosinophils Relative: 5 %
HEMATOCRIT: 37 % (ref 36.0–46.0)
Hemoglobin: 12.1 g/dL (ref 12.0–15.0)
LYMPHS PCT: 45 %
Lymphs Abs: 3.6 10*3/uL (ref 0.7–4.0)
MCH: 27.8 pg (ref 26.0–34.0)
MCHC: 32.7 g/dL (ref 30.0–36.0)
MCV: 84.9 fL (ref 78.0–100.0)
MONO ABS: 0.5 10*3/uL (ref 0.1–1.0)
Monocytes Relative: 6 %
NEUTROS ABS: 3.4 10*3/uL (ref 1.7–7.7)
Neutrophils Relative %: 43 %
Platelets: 421 10*3/uL — ABNORMAL HIGH (ref 150–400)
RBC: 4.36 MIL/uL (ref 3.87–5.11)
RDW: 14.8 % (ref 11.5–15.5)
WBC: 7.9 10*3/uL (ref 4.0–10.5)

## 2017-05-02 LAB — TYPE AND SCREEN
ABO/RH(D): O POS
ANTIBODY SCREEN: NEGATIVE

## 2017-05-02 LAB — ABO/RH: ABO/RH(D): O POS

## 2017-05-02 LAB — PROTIME-INR
INR: 0.96
PROTHROMBIN TIME: 12.7 s (ref 11.4–15.2)

## 2017-05-02 LAB — APTT: APTT: 32 s (ref 24–36)

## 2017-05-02 NOTE — Progress Notes (Signed)
Kelly High from Dr. Candise Bowens office made aware of pt's abnormal urine results. [facility] +[facility]ive Staph screening. Pt also informed.

## 2017-05-02 NOTE — Progress Notes (Signed)
PCP - Dr. Lupita Shutter  Cardiologist - Denies  Chest x-ray - 05/02/17  EKG - Denies  Stress Test - Denies  ECHO - Denies  Cardiac Cath - Denies  Sleep Study - No CPAP - None   Pt denies having chest pain, sob, or fever at this time. All instructions explained to the pt, with a verbal understanding of the material. Pt agrees to go over the instructions while at home for a better understanding. The opportunity to ask questions was provided.

## 2017-05-03 NOTE — Progress Notes (Signed)
Anesthesia Chart Review:  Pt is a 51 year old female scheduled for L total knee arthroplasty on 05/13/2017 with Frederico Hamman, MD  PMH includes:  Iron deficiency. Current smoker. BMI 32. S/p L knee arthroscopy 09/17/16. S/p ORIF L ulnar fracture 04/14/15.   Medications include: prilosec.   BP 105/72   Pulse 60   Temp 36.8 C   Resp 20   Ht  (1.651 m)   Wt 192 lb 14.4 oz (87.5 kg)   SpO2 100%   BMI 32.10 kg/m    Preoperative labs reviewed.  Urine culture shows UTI. I notified Tresa Endo in Dr. Candise Bowens office.   CXR 05/02/17: Normal exam.  EKG 03/10/16: Sinus rhythm. PVCs. Probable LVH  If no changes, I anticipate pt can proceed with surgery as scheduled.   Rica Mast, FNP-BC [facility] Short Stay Surgical Center/Anesthesiology Phone: [phone] 05/03/2017 4:57 PM

## 2017-05-04 LAB — URINE CULTURE: Culture: >=100000 — AB

## 2017-05-12 MED ORDER — SODIUM CHLORIDE 0.9 % IV SOLN
2000.0000 mg | INTRAVENOUS | Status: AC
  Administered 2017-05-13: 2000 mg via TOPICAL
  Filled 2017-05-12: qty 20

## 2017-05-12 MED ORDER — TRANEXAMIC ACID 1000 MG/10ML IV SOLN
1000.0000 mg | INTRAVENOUS | Status: AC
  Administered 2017-05-13: 1000 mg via INTRAVENOUS
  Filled 2017-05-12: qty 1100

## 2017-05-12 MED ORDER — BUPIVACAINE LIPOSOME 1.3 % IJ SUSP
20.0000 mL | Freq: Once | INTRAMUSCULAR | Status: AC
  Administered 2017-05-13: 20 mL
  Filled 2017-05-12: qty 20

## 2017-05-13 ENCOUNTER — Encounter (HOSPITAL_COMMUNITY)
Admission: RE | Disposition: A | Discharge status: Home Health | Payer: Self-pay | Source: Ambulatory Visit | Attending: Orthopedic Surgery

## 2017-05-13 ENCOUNTER — Inpatient Hospital Stay (HOSPITAL_COMMUNITY): Payer: Managed Care, Other (non HMO) | Admitting: Anesthesiology

## 2017-05-13 ENCOUNTER — Inpatient Hospital Stay (HOSPITAL_COMMUNITY): Payer: Managed Care, Other (non HMO) | Admitting: Emergency Medicine

## 2017-05-13 ENCOUNTER — Encounter (HOSPITAL_COMMUNITY): Payer: Self-pay | Admitting: *Deleted

## 2017-05-13 ENCOUNTER — Inpatient Hospital Stay (HOSPITAL_COMMUNITY)
Admission: RE | Admit: 2017-05-13 | Discharge: 2017-05-16 | DRG: 470 | Disposition: A | Discharge status: Home Health | Payer: Managed Care, Other (non HMO) | Source: Ambulatory Visit | Attending: Orthopedic Surgery | Admitting: Orthopedic Surgery

## 2017-05-13 DIAGNOSIS — F17210 Nicotine dependence, cigarettes, uncomplicated: Secondary | ICD-10-CM | POA: Diagnosis present

## 2017-05-13 DIAGNOSIS — D62 Acute posthemorrhagic anemia: Secondary | ICD-10-CM | POA: Diagnosis not present

## 2017-05-13 DIAGNOSIS — K5900 Constipation, unspecified: Secondary | ICD-10-CM | POA: Diagnosis present

## 2017-05-13 DIAGNOSIS — Z90710 Acquired absence of both cervix and uterus: Secondary | ICD-10-CM

## 2017-05-13 DIAGNOSIS — M1712 Unilateral primary osteoarthritis, left knee: Secondary | ICD-10-CM | POA: Diagnosis present

## 2017-05-13 DIAGNOSIS — Z23 Encounter for immunization: Secondary | ICD-10-CM | POA: Diagnosis not present

## 2017-05-13 DIAGNOSIS — M25562 Pain in left knee: Secondary | ICD-10-CM | POA: Diagnosis present

## 2017-05-13 DIAGNOSIS — E876 Hypokalemia: Secondary | ICD-10-CM | POA: Diagnosis present

## 2017-05-13 HISTORY — PX: TOTAL KNEE ARTHROPLASTY: SHX125

## 2017-05-13 SURGERY — ARTHROPLASTY, KNEE, TOTAL
Anesthesia: Monitor Anesthesia Care | Site: Knee | Laterality: Left

## 2017-05-13 MED ORDER — GLYCOPYRROLATE 0.2 MG/ML IV SOSY
PREFILLED_SYRINGE | INTRAVENOUS | Status: DC | PRN
  Administered 2017-05-13: .2 mg via INTRAVENOUS

## 2017-05-13 MED ORDER — METOCLOPRAMIDE HCL 5 MG/ML IJ SOLN: 10.0000 mg | Freq: Once | INTRAMUSCULAR | Status: DC | PRN

## 2017-05-13 MED ORDER — VANCOMYCIN HCL IN DEXTROSE 1-5 GM/200ML-% IV SOLN
1000.0000 mg | INTRAVENOUS | Status: AC
  Administered 2017-05-13: 1000 mg via INTRAVENOUS

## 2017-05-13 MED ORDER — FENTANYL CITRATE (PF) 100 MCG/2ML IJ SOLN
INTRAMUSCULAR | Status: DC | PRN
  Administered 2017-05-13 (×3): 50 ug via INTRAVENOUS

## 2017-05-13 MED ORDER — ROPIVACAINE HCL 5 MG/ML IJ SOLN
INTRAMUSCULAR | Status: DC | PRN
  Administered 2017-05-13: 30 mL via PERINEURAL

## 2017-05-13 MED ORDER — PHENYLEPHRINE HCL 10 MG/ML IJ SOLN
INTRAMUSCULAR | Status: DC | PRN
  Administered 2017-05-13: 25 ug/min via INTRAVENOUS

## 2017-05-13 MED ORDER — VANCOMYCIN HCL IN DEXTROSE 1-5 GM/200ML-% IV SOLN
1000.0000 mg | Freq: Two times a day (BID) | INTRAVENOUS | Status: AC
  Administered 2017-05-13: 1000 mg via INTRAVENOUS
  Filled 2017-05-13: qty 200

## 2017-05-13 MED ORDER — CHLORHEXIDINE GLUCONATE 4 % EX LIQD: 60.0000 mL | Freq: Once | CUTANEOUS | Status: DC

## 2017-05-13 MED ORDER — DOCUSATE SODIUM 100 MG PO CAPS
100.0000 mg | ORAL_CAPSULE | Freq: Two times a day (BID) | ORAL | Status: DC
  Administered 2017-05-13 – 2017-05-16 (×6): 100 mg via ORAL
  Filled 2017-05-13 (×6): qty 1

## 2017-05-13 MED ORDER — FLEET ENEMA 7-19 GM/118ML RE ENEM: 1.0000 | ENEMA | Freq: Once | RECTAL | Status: DC | PRN

## 2017-05-13 MED ORDER — BUPIVACAINE-EPINEPHRINE (PF) 0.25% -1:200000 IJ SOLN
INTRAMUSCULAR | Status: DC | PRN
  Administered 2017-05-13: 50 mL

## 2017-05-13 MED ORDER — FENTANYL CITRATE (PF) 100 MCG/2ML IJ SOLN
INTRAMUSCULAR | Status: AC
  Filled 2017-05-13: qty 2

## 2017-05-13 MED ORDER — ACETAMINOPHEN 650 MG RE SUPP
650.0000 mg | RECTAL | Status: DC | PRN
Start: 2017-05-13 — End: 2017-05-16

## 2017-05-13 MED ORDER — ACETAMINOPHEN 325 MG PO TABS
650.0000 mg | ORAL_TABLET | ORAL | Status: DC | PRN
  Administered 2017-05-14 – 2017-05-16 (×4): 650 mg via ORAL
  Filled 2017-05-13 (×4): qty 2

## 2017-05-13 MED ORDER — MIDAZOLAM HCL 2 MG/2ML IJ SOLN
2.0000 mg | Freq: Once | INTRAMUSCULAR | Status: AC
  Administered 2017-05-13: 2 mg via INTRAVENOUS

## 2017-05-13 MED ORDER — MIDAZOLAM HCL 2 MG/2ML IJ SOLN
INTRAMUSCULAR | Status: AC
  Administered 2017-05-13: 2 mg via INTRAVENOUS
  Filled 2017-05-13: qty 2

## 2017-05-13 MED ORDER — ONDANSETRON HCL 4 MG/2ML IJ SOLN
4.0000 mg | Freq: Four times a day (QID) | INTRAMUSCULAR | Status: DC | PRN
  Administered 2017-05-14: 4 mg via INTRAVENOUS
  Filled 2017-05-13: qty 2

## 2017-05-13 MED ORDER — PROPOFOL 500 MG/50ML IV EMUL
INTRAVENOUS | Status: DC | PRN
  Administered 2017-05-13: 75 ug/kg/min via INTRAVENOUS

## 2017-05-13 MED ORDER — LIDOCAINE HCL (CARDIAC) 20 MG/ML IV SOLN
INTRAVENOUS | Status: DC | PRN
  Administered 2017-05-13: 20 mg via INTRATRACHEAL
  Administered 2017-05-13: 40 mg via INTRATRACHEAL

## 2017-05-13 MED ORDER — SODIUM CHLORIDE 0.9 % IR SOLN
Status: DC | PRN
  Administered 2017-05-13: 1000 mL

## 2017-05-13 MED ORDER — LACTATED RINGERS IV SOLN: INTRAVENOUS | Status: DC

## 2017-05-13 MED ORDER — EPHEDRINE SULFATE-NACL 50-0.9 MG/10ML-% IV SOSY
PREFILLED_SYRINGE | INTRAVENOUS | Status: DC | PRN
  Administered 2017-05-13: 5 mg via INTRAVENOUS

## 2017-05-13 MED ORDER — METOCLOPRAMIDE HCL 5 MG PO TABS: 5.0000 mg | ORAL_TABLET | Freq: Three times a day (TID) | ORAL | Status: DC | PRN

## 2017-05-13 MED ORDER — HYDROMORPHONE HCL 1 MG/ML IJ SOLN
0.5000 mg | INTRAMUSCULAR | Status: DC | PRN
  Administered 2017-05-13 – 2017-05-16 (×7): 0.5 mg via INTRAVENOUS
  Filled 2017-05-13 (×8): qty 1

## 2017-05-13 MED ORDER — POLYETHYLENE GLYCOL 3350 17 G PO PACK
17.0000 g | PACK | Freq: Every day | ORAL | Status: DC | PRN
  Administered 2017-05-16: 17 g via ORAL
  Filled 2017-05-13 (×2): qty 1

## 2017-05-13 MED ORDER — FENTANYL CITRATE (PF) 100 MCG/2ML IJ SOLN
25.0000 ug | INTRAMUSCULAR | Status: DC | PRN
  Administered 2017-05-13: 50 ug via INTRAVENOUS

## 2017-05-13 MED ORDER — BUPIVACAINE-EPINEPHRINE (PF) 0.25% -1:200000 IJ SOLN
INTRAMUSCULAR | Status: AC
  Filled 2017-05-13: qty 30

## 2017-05-13 MED ORDER — SODIUM CHLORIDE 0.9 % IV SOLN
INTRAVENOUS | Status: DC
  Administered 2017-05-13: 16:00:00 via INTRAVENOUS

## 2017-05-13 MED ORDER — FENTANYL CITRATE (PF) 250 MCG/5ML IJ SOLN
INTRAMUSCULAR | Status: AC
  Filled 2017-05-13: qty 5

## 2017-05-13 MED ORDER — APIXABAN 2.5 MG PO TABS
2.5000 mg | ORAL_TABLET | Freq: Two times a day (BID) | ORAL | Status: DC
  Administered 2017-05-14 – 2017-05-16 (×5): 2.5 mg via ORAL
  Filled 2017-05-13 (×5): qty 1

## 2017-05-13 MED ORDER — LIDOCAINE 2% (20 MG/ML) 5 ML SYRINGE
INTRAMUSCULAR | Status: AC
  Filled 2017-05-13: qty 5

## 2017-05-13 MED ORDER — ONDANSETRON HCL 4 MG/2ML IJ SOLN
INTRAMUSCULAR | Status: DC | PRN
  Administered 2017-05-13: 4 mg via INTRAVENOUS

## 2017-05-13 MED ORDER — MEPERIDINE HCL 25 MG/ML IJ SOLN: 6.2500 mg | INTRAMUSCULAR | Status: DC | PRN

## 2017-05-13 MED ORDER — PHENOL 1.4 % MT LIQD: 1.0000 | OROMUCOSAL | Status: DC | PRN

## 2017-05-13 MED ORDER — METOCLOPRAMIDE HCL 5 MG/ML IJ SOLN: 5.0000 mg | Freq: Three times a day (TID) | INTRAMUSCULAR | Status: DC | PRN

## 2017-05-13 MED ORDER — ONDANSETRON HCL 4 MG PO TABS: 4.0000 mg | ORAL_TABLET | Freq: Four times a day (QID) | ORAL | Status: DC | PRN

## 2017-05-13 MED ORDER — SORBITOL 70 % SOLN: 30.0000 mL | Freq: Every day | Status: DC | PRN

## 2017-05-13 MED ORDER — LACTATED RINGERS IV SOLN
INTRAVENOUS | Status: DC
  Administered 2017-05-13 (×2): via INTRAVENOUS

## 2017-05-13 MED ORDER — FENTANYL CITRATE (PF) 100 MCG/2ML IJ SOLN
100.0000 ug | Freq: Once | INTRAMUSCULAR | Status: AC
  Administered 2017-05-13: 100 ug via INTRAVENOUS

## 2017-05-13 MED ORDER — OXYCODONE-ACETAMINOPHEN 5-325 MG PO TABS: 1.0000 | ORAL_TABLET | ORAL | 0 refills | Status: DC | PRN

## 2017-05-13 MED ORDER — TRANEXAMIC ACID 1000 MG/10ML IV SOLN
1000.0000 mg | Freq: Once | INTRAVENOUS | Status: AC
  Administered 2017-05-13: 1000 mg via INTRAVENOUS
  Filled 2017-05-13: qty 10

## 2017-05-13 MED ORDER — OXYCODONE HCL 5 MG PO TABS: 5.0000 mg | ORAL_TABLET | ORAL | Status: DC | PRN

## 2017-05-13 MED ORDER — SODIUM CHLORIDE 0.9 % IV SOLN: INTRAVENOUS | Status: DC

## 2017-05-13 MED ORDER — DIPHENHYDRAMINE HCL 12.5 MG/5ML PO ELIX: 12.5000 mg | ORAL_SOLUTION | ORAL | Status: DC | PRN

## 2017-05-13 MED ORDER — FENTANYL CITRATE (PF) 100 MCG/2ML IJ SOLN
INTRAMUSCULAR | Status: AC
  Administered 2017-05-13: 100 ug via INTRAVENOUS
  Filled 2017-05-13: qty 2

## 2017-05-13 MED ORDER — VANCOMYCIN HCL 1000 MG IV SOLR
INTRAVENOUS | Status: DC | PRN
  Administered 2017-05-13: 1000 mg via INTRAVENOUS

## 2017-05-13 MED ORDER — METHOCARBAMOL 500 MG PO TABS
500.0000 mg | ORAL_TABLET | Freq: Four times a day (QID) | ORAL | Status: DC | PRN
  Administered 2017-05-13 – 2017-05-16 (×8): 500 mg via ORAL
  Filled 2017-05-13 (×8): qty 1

## 2017-05-13 MED ORDER — EPHEDRINE 5 MG/ML INJ
INTRAVENOUS | Status: AC
  Filled 2017-05-13: qty 10

## 2017-05-13 MED ORDER — MENTHOL 3 MG MT LOZG: 1.0000 | LOZENGE | OROMUCOSAL | Status: DC | PRN

## 2017-05-13 MED ORDER — ONDANSETRON HCL 4 MG/2ML IJ SOLN
INTRAMUSCULAR | Status: AC
  Filled 2017-05-13: qty 2

## 2017-05-13 MED ORDER — MIDAZOLAM HCL 2 MG/2ML IJ SOLN
INTRAMUSCULAR | Status: AC
  Filled 2017-05-13: qty 2

## 2017-05-13 MED ORDER — VANCOMYCIN HCL IN DEXTROSE 1-5 GM/200ML-% IV SOLN
INTRAVENOUS | Status: AC
Start: 2017-05-13 — End: 2017-05-13
  Administered 2017-05-13: 1000 mg via INTRAVENOUS
  Filled 2017-05-13: qty 200

## 2017-05-13 MED ORDER — DEXTROSE 5 % IV SOLN: 500.0000 mg | Freq: Four times a day (QID) | INTRAVENOUS | Status: DC | PRN

## 2017-05-13 MED ORDER — PROPOFOL 10 MG/ML IV BOLUS
INTRAVENOUS | Status: DC | PRN
  Administered 2017-05-13: 20 mg via INTRAVENOUS

## 2017-05-13 MED ORDER — APIXABAN 2.5 MG PO TABS: 2.5000 mg | ORAL_TABLET | Freq: Two times a day (BID) | ORAL | 0 refills | Status: DC

## 2017-05-13 MED ORDER — PANTOPRAZOLE SODIUM 40 MG PO TBEC
40.0000 mg | DELAYED_RELEASE_TABLET | Freq: Every day | ORAL | Status: DC
  Administered 2017-05-13 – 2017-05-16 (×4): 40 mg via ORAL
  Filled 2017-05-13 (×4): qty 1

## 2017-05-13 MED ORDER — MIDAZOLAM HCL 5 MG/5ML IJ SOLN
INTRAMUSCULAR | Status: DC | PRN
  Administered 2017-05-13: 2 mg via INTRAVENOUS

## 2017-05-13 MED ORDER — SODIUM CHLORIDE 0.9% FLUSH
INTRAVENOUS | Status: DC | PRN
  Administered 2017-05-13: 50 mL via INTRAVENOUS

## 2017-05-13 MED ORDER — BUPIVACAINE IN DEXTROSE 0.75-8.25 % IT SOLN
INTRATHECAL | Status: DC | PRN
  Administered 2017-05-13: 1.5 mL via INTRATHECAL

## 2017-05-13 MED ORDER — OXYCODONE HCL 5 MG PO TABS
10.0000 mg | ORAL_TABLET | ORAL | Status: DC | PRN
  Administered 2017-05-13 – 2017-05-16 (×9): 10 mg via ORAL
  Filled 2017-05-13 (×10): qty 2

## 2017-05-13 SURGICAL SUPPLY — 65 items
BANDAGE ACE 4X5 VEL STRL LF (GAUZE/BANDAGES/DRESSINGS) ×3 IMPLANT
BANDAGE ACE 6X5 VEL STRL LF (GAUZE/BANDAGES/DRESSINGS) ×3 IMPLANT
BANDAGE ELASTIC 4 VELCRO ST LF (GAUZE/BANDAGES/DRESSINGS) ×3 IMPLANT
BANDAGE ELASTIC 6 VELCRO ST LF (GAUZE/BANDAGES/DRESSINGS) ×3 IMPLANT
BANDAGE ESMARK 6X9 LF (GAUZE/BANDAGES/DRESSINGS) ×1 IMPLANT
BLADE SAGITTAL 25.0X1.19X90 (BLADE) ×2 IMPLANT
BLADE SAGITTAL 25.0X1.19X90MM (BLADE) ×1
BLADE SAW SAG 90X13X1.27 (BLADE) ×3 IMPLANT
BNDG ESMARK 6X9 LF (GAUZE/BANDAGES/DRESSINGS) ×3
BOWL SMART MIX CTS (DISPOSABLE) ×3 IMPLANT
CAP KNEE TOTAL 3 SIGMA ×3 IMPLANT
CEMENT HV SMART SET (Cement) ×6 IMPLANT
COVER SURGICAL LIGHT HANDLE (MISCELLANEOUS) ×3 IMPLANT
CUFF TOURNIQUET SINGLE 34IN LL (TOURNIQUET CUFF) ×3 IMPLANT
CUFF TOURNIQUET SINGLE 44IN (TOURNIQUET CUFF) IMPLANT
DRAPE INCISE IOBAN 66X45 STRL (DRAPES) IMPLANT
DRAPE ORTHO SPLIT 77X108 STRL (DRAPES) ×4
DRAPE SURG ORHT 6 SPLT 77X108 (DRAPES) ×2 IMPLANT
DRAPE U-SHAPE 47X51 STRL (DRAPES) ×3 IMPLANT
DRSG ADAPTIC 3X8 NADH LF (GAUZE/BANDAGES/DRESSINGS) ×3 IMPLANT
DRSG PAD ABDOMINAL 8X10 ST (GAUZE/BANDAGES/DRESSINGS) ×6 IMPLANT
DURAPREP 26ML APPLICATOR (WOUND CARE) ×3 IMPLANT
ELECT REM PT RETURN 9FT ADLT (ELECTROSURGICAL) ×3
ELECTRODE REM PT RTRN 9FT ADLT (ELECTROSURGICAL) ×1 IMPLANT
EVACUATOR 1/8 PVC DRAIN (DRAIN) IMPLANT
FACESHIELD WRAPAROUND (MASK) ×3 IMPLANT
GAUZE SPONGE 4X4 12PLY STRL (GAUZE/BANDAGES/DRESSINGS) ×3 IMPLANT
GLOVE BIOGEL PI IND STRL 8 (GLOVE) ×4 IMPLANT
GLOVE BIOGEL PI INDICATOR 8 (GLOVE) ×8
GLOVE ORTHO TXT STRL SZ7.5 (GLOVE) ×3 IMPLANT
GLOVE SURG ORTHO 8.0 STRL STRW (GLOVE) ×3 IMPLANT
GOWN STRL REUS W/ TWL LRG LVL3 (GOWN DISPOSABLE) ×1 IMPLANT
GOWN STRL REUS W/ TWL XL LVL3 (GOWN DISPOSABLE) ×1 IMPLANT
GOWN STRL REUS W/TWL 2XL LVL3 (GOWN DISPOSABLE) ×3 IMPLANT
GOWN STRL REUS W/TWL LRG LVL3 (GOWN DISPOSABLE) ×2
GOWN STRL REUS W/TWL XL LVL3 (GOWN DISPOSABLE) ×2
HANDPIECE INTERPULSE COAX TIP (DISPOSABLE) ×2
HOOD PEEL AWAY FACE SHEILD DIS (HOOD) ×3 IMPLANT
IMMOBILIZER KNEE 22 UNIV (SOFTGOODS) IMPLANT
KIT BASIN OR (CUSTOM PROCEDURE TRAY) ×3 IMPLANT
KIT ROOM TURNOVER OR (KITS) ×3 IMPLANT
MANIFOLD NEPTUNE II (INSTRUMENTS) ×3 IMPLANT
NEEDLE 22X1 1/2 (OR ONLY) (NEEDLE) ×6 IMPLANT
NS IRRIG 1000ML POUR BTL (IV SOLUTION) ×3 IMPLANT
PACK TOTAL JOINT (CUSTOM PROCEDURE TRAY) ×3 IMPLANT
PAD ABD 8X10 STRL (GAUZE/BANDAGES/DRESSINGS) ×3 IMPLANT
PAD ARMBOARD 7.5X6 YLW CONV (MISCELLANEOUS) ×6 IMPLANT
PAD CAST 4YDX4 CTTN HI CHSV (CAST SUPPLIES) ×1 IMPLANT
PADDING CAST COTTON 4X4 STRL (CAST SUPPLIES) ×2
PADDING CAST COTTON 6X4 STRL (CAST SUPPLIES) ×3 IMPLANT
SET HNDPC FAN SPRY TIP SCT (DISPOSABLE) ×1 IMPLANT
STAPLER VISISTAT 35W (STAPLE) ×3 IMPLANT
SUCTION FRAZIER HANDLE 10FR (MISCELLANEOUS) ×2
SUCTION TUBE FRAZIER 10FR DISP (MISCELLANEOUS) ×1 IMPLANT
SUT ETHIBOND NAB CT1 #1 30IN (SUTURE) ×9 IMPLANT
SUT VIC AB 0 CT1 27 (SUTURE) ×2
SUT VIC AB 0 CT1 27XBRD ANBCTR (SUTURE) ×1 IMPLANT
SUT VIC AB 2-0 CT1 27 (SUTURE) ×4
SUT VIC AB 2-0 CT1 TAPERPNT 27 (SUTURE) ×2 IMPLANT
SYR CONTROL 10ML LL (SYRINGE) ×6 IMPLANT
SYRINGE 60CC LL (MISCELLANEOUS) ×3 IMPLANT
TOWEL GREEN STERILE (TOWEL DISPOSABLE) ×3 IMPLANT
TRAY CATH 16FR W/PLASTIC CATH (SET/KITS/TRAYS/PACK) IMPLANT
TRAY FOLEY W/METER SILVER 16FR (SET/KITS/TRAYS/PACK) IMPLANT
WATER STERILE IRR 1000ML POUR (IV SOLUTION) ×3 IMPLANT

## 2017-05-13 NOTE — Anesthesia Procedure Notes (Signed)
Procedure Name: MAC Performed by: Scheryl Darter Pre-anesthesia Checklist: Patient identified, Emergency Drugs available, Suction available, Patient being monitored and Timeout performed Patient Re-evaluated:Patient Re-evaluated prior to induction Oxygen Delivery Method: Nasal cannula Preoxygenation: Pre-oxygenation with 100% oxygen Placement Confirmation: positive ETCO2

## 2017-05-13 NOTE — Anesthesia Procedure Notes (Addendum)
Anesthesia Regional Block: Adductor canal block   Pre-Anesthetic Checklist: ,, timeout performed, Correct Patient, Correct Site, Correct Laterality, Correct Procedure, Correct Position, site marked, Risks and benefits discussed,  Surgical consent,  Pre-op evaluation,  At surgeon's request and post-op pain management  Laterality: Left and Lower  Prep: Maximum Sterile Barrier Precautions used, chloraprep       Needles:  Injection technique: Single-shot  Needle Type: Echogenic Stimulator Needle     Needle Length: 10cm      Additional Needles:   Procedures:,,,, ultrasound used (permanent image in chart),,,,  Narrative:  Start time: 05/13/2017 9:43 AM End time: 05/13/2017 9:48 AM Injection made incrementally with aspirations every 5 mL.  Performed by: Personally  Anesthesiologist: Phillips GroutARIGNAN, Loa Idler  Additional Notes: Risks, benefits and alternative to block explained extensively.  Patient tolerated procedure well, without complications.

## 2017-05-13 NOTE — Care Management Note (Signed)
Case Management Note  Patient Details  Name: Lauren Boyle MRN: [id number] Date of Birth: 10/04/1965  Subjective/Objective:    51 yr old female s/p left total knee arthroplasty.                Action/Plan: Case manager spoke with patient concerning discharge plan and DME. Patient was preoperatively setup with [facility], no changes. She will have family support at discharge. Case manager will order RW and 3in1, [employer] will deliver CPM to patient's home at discharge.   Expected Discharge Date:    pending            Expected Discharge Plan:  Home w Home Health Services  In-House Referral:  NA  Discharge planning Services  CM Consult  Post Acute Care Choice:  Durable Medical Equipment, Home Health Choice offered to:  Patient  DME Arranged:  3-N-1, CPM, Walker rolling DME Agency:  [facility] [employer]  HH Arranged:  PT HH Agency:  [facility] (formerly [facility])  Status of Service:  In process, will continue to follow  If discussed at Long Length of Stay Meetings, dates discussed:    Additional Comments:  Durenda GuthrieBrady, Ladavia Lindenbaum Naomi, RN 05/13/2017, 3:48 PM

## 2017-05-13 NOTE — Anesthesia Procedure Notes (Signed)
Spinal  Patient location during procedure: OR Staffing Anesthesiologist: Kale Dols Performed: anesthesiologist  Preanesthetic Checklist Completed: patient identified, site marked, surgical consent, pre-op evaluation, timeout performed, IV checked, risks and benefits discussed and monitors and equipment checked Spinal Block Patient position: sitting Prep: DuraPrep Patient monitoring: heart rate, continuous pulse ox and blood pressure Approach: right paramedian Location: L4-5 Injection technique: single-shot Needle Needle type: Sprotte  Needle gauge: 24 G Needle length: 9 cm Additional Notes Expiration date of kit checked and confirmed. Patient tolerated procedure well, without complications.       

## 2017-05-13 NOTE — Evaluation (Signed)
Physical Therapy Evaluation Patient Details Name: Lauren Boyle MRN: [id number] DOB: 1966/06/16 Today's Date: 05/13/2017   History of Present Illness  Pt is a 51 y.o. female s/p elective L TKA on 05/13/17. Pertinent PMH includes OA, depression.   Clinical Impression  Pt presents with pain and an overall decrease in functional mobility secondary to above. PTA, pt indep and lives with husband; sister will be available for 24/7 assist at d/c if needed. Educ on precautions, positioning, CPM use, therex, and importance of mobility. Today, pt able to take steps from bed to chair with RW and min guard for balance; further mobility limited secondary to significant pain and nausea. Pt would benefit from continued acute PT services to maximize functional mobility and independence prior to d/c with HHPT.    Follow Up Recommendations DC plan and follow up therapy as arranged by surgeon;Home health PT    Equipment Recommendations  None recommended by PT (DME delivered to room)    Recommendations for Other Services       Precautions / Restrictions Precautions Precautions: Knee Required Braces or Orthoses: Knee Immobilizer - Left Knee Immobilizer - Left: On when out of bed or walking;Other (comment) (Additional order to discontinue KI on 05/14/17) Restrictions Weight Bearing Restrictions: Yes LLE Weight Bearing: Weight bearing as tolerated      Mobility  Bed Mobility Overal bed mobility: Needs Assistance Bed Mobility: Supine to Sit     Supine to sit: Min assist     General bed mobility comments: MinA to assist LLE to EOB. C/o increased nausea in sitting  Transfers Overall transfer level: Needs assistance Equipment used: Rolling walker (2 wheeled) Transfers: Sit to/from Stand Sit to Stand: Min guard         General transfer comment: Educ on hand placement for RW  Ambulation/Gait Ambulation/Gait assistance: Min guard Ambulation Distance (Feet): 3 Feet Assistive device: Rolling  walker (2 wheeled) Gait Pattern/deviations: Step-to pattern;Decreased weight shift to left;Antalgic Gait velocity: Decreased Gait velocity interpretation: <1.8 ft/sec, indicative of risk for recurrent falls General Gait Details: Took steps from bed to chair with RW and min guard for balance; further mobility limited by pain and nausea  Stairs            Wheelchair Mobility    Modified Rankin (Stroke Patients Only)       Balance Overall balance assessment: Needs assistance Sitting-balance support: No upper extremity supported;Feet supported;Feet unsupported Sitting balance-Leahy Scale: Good     Standing balance support: Bilateral upper extremity supported Standing balance-Leahy Scale: Poor                               Pertinent Vitals/Pain Pain Assessment: Faces Faces Pain Scale: Hurts whole lot Pain Location: L anterior thigh Pain Descriptors / Indicators: Crying;Grimacing;Moaning Pain Intervention(s): Premedicated before session;Limited activity within patient's tolerance    Home Living Family/patient expects to be discharged to:: Private residence Living Arrangements: Spouse/significant other Available Help at Discharge: Family Type of Home: House Home Access: Stairs to enter Entrance Stairs-Rails: Doctor, general practice of Steps: 2 Home Layout: One level Home Equipment: None      Prior Function Level of Independence: Independent               Hand Dominance        Extremity/Trunk Assessment   Upper Extremity Assessment Upper Extremity Assessment: Overall WFL for tasks assessed    Lower Extremity Assessment Lower Extremity Assessment: LLE deficits/detail LLE  Deficits / Details: s/p L TKA; hip flexion grossly 3/5       Communication   Communication: No difficulties  Cognition Arousal/Alertness: Awake/alert Behavior During Therapy: WFL for tasks assessed/performed Overall Cognitive Status: Within Functional Limits  for tasks assessed                                        General Comments General comments (skin integrity, edema, etc.): Husband present during session    Exercises Total Joint Exercises Ankle Circles/Pumps: AROM;Both;10 reps;Seated Quad Sets: AROM;Both;10 reps;Seated   Assessment/Plan    PT Assessment Patient needs continued PT services  PT Problem List Decreased strength;Decreased range of motion;Decreased activity tolerance;Decreased balance;Decreased mobility;Decreased knowledge of use of DME;Decreased knowledge of precautions;Pain       PT Treatment Interventions DME instruction;Gait training;Stair training;Functional mobility training;Therapeutic activities;Therapeutic exercise;Balance training;Patient/family education    PT Goals (Current goals can be found in the Care Plan section)  Acute Rehab PT Goals Patient Stated Goal: Decreased pain PT Goal Formulation: With patient Time For Goal Achievement: 05/27/17 Potential to Achieve Goals: Good    Frequency 7X/week   Barriers to discharge        Co-evaluation               AM-PAC PT "6 Clicks" Daily Activity  Outcome Measure Difficulty turning over in bed (including adjusting bedclothes, sheets and blankets)?: Unable Difficulty moving from lying on back to sitting on the side of the bed? : Unable Difficulty sitting down on and standing up from a chair with arms (e.g., wheelchair, bedside commode, etc,.)?: A Little Help needed moving to and from a bed to chair (including a wheelchair)?: A Little Help needed walking in hospital room?: A Little Help needed climbing 3-5 steps with a railing? : A Lot 6 Click Score: 13    End of Session Equipment Utilized During Treatment: Gait belt Activity Tolerance: Patient limited by pain Patient left: in chair;with call bell/phone within reach;with family/visitor present Nurse Communication: Mobility status PT Visit Diagnosis: Other abnormalities of gait  and mobility (R26.89);Pain Pain - Right/Left: Left Pain - part of body: Leg    Time: [id number] PT Time Calculation (min) (ACUTE ONLY): 20 min   Charges:   PT Evaluation $PT Eval Low Complexity: 1 Low     PT G Codes:       Ina HomesJaclyn Joanie Duprey, PT, DPT Acute Rehab Services  Pager: [phone]  Malachy ChamberJaclyn L Katoria Yetman 05/13/2017, 5:21 PM

## 2017-05-13 NOTE — Anesthesia Postprocedure Evaluation (Signed)
Anesthesia Post Note  Patient: Lauren Boyle  Procedure(s) Performed: LEFT TOTAL KNEE ARTHROPLASTY (Left Knee)     Patient location during evaluation: PACU Anesthesia Type: Regional Level of consciousness: awake and alert Pain management: pain level controlled Vital Signs Assessment: post-procedure vital signs reviewed and stable Respiratory status: spontaneous breathing and respiratory function stable Cardiovascular status: blood pressure returned to baseline and stable Postop Assessment: no headache, no backache, spinal receding and no apparent nausea or vomiting Anesthetic complications: no    Last Vitals:  Vitals:   05/13/17 1445 05/13/17 1500  BP:  110/69  Pulse:  (!) 52  Resp:  16  Temp: (!) 36.2 C 36.7 C  SpO2:  100%    Last Pain:  Vitals:   05/13/17 1553  TempSrc:   PainSc: 10-Worst pain ever                 Phillips Groutarignan, Naiyana Barbian

## 2017-05-13 NOTE — Anesthesia Preprocedure Evaluation (Signed)
Anesthesia Evaluation  Patient identified by MRN, date of birth, ID band Patient awake    Reviewed: Allergy & Precautions, NPO status , Patient's Chart, lab work & pertinent test results  Airway Mallampati: II  TM Distance: >3 FB Neck ROM: Full    Dental no notable dental hx.    Pulmonary neg pulmonary ROS, Current Smoker,    Pulmonary exam normal breath sounds clear to auscultation       Cardiovascular negative cardio ROS Normal cardiovascular exam Rhythm:Regular Rate:Normal     Neuro/Psych negative neurological ROS  negative psych ROS   GI/Hepatic negative GI ROS, Neg liver ROS,   Endo/Other  negative endocrine ROS  Renal/GU negative Renal ROS  negative genitourinary   Musculoskeletal negative musculoskeletal ROS (+)   Abdominal   Peds negative pediatric ROS (+)  Hematology negative hematology ROS (+)   Anesthesia Other Findings   Reproductive/Obstetrics negative OB ROS                             Anesthesia Physical Anesthesia Plan  ASA: II  Anesthesia Plan: Spinal   Post-op Pain Management:  Regional for Post-op pain   Induction:   PONV Risk Score and Plan: 1 and Ondansetron  Airway Management Planned: Simple Face Mask  Additional Equipment:   Intra-op Plan:   Post-operative Plan:   Informed Consent: I have reviewed the patients History and Physical, chart, labs and discussed the procedure including the risks, benefits and alternatives for the proposed anesthesia with the patient or authorized representative who has indicated his/her understanding and acceptance.   Dental advisory given  Plan Discussed with: CRNA  Anesthesia Plan Comments:         Anesthesia Quick Evaluation

## 2017-05-13 NOTE — Progress Notes (Signed)
Orthopedic Tech Progress Note Patient Details:  Lauren Boyle 08/20/1965 [id number]  CPM Left Knee CPM Left Knee: On Left Knee Flexion (Degrees): 90 Left Knee Extension (Degrees): 0 Additional Comments: trapeze bar patient helper   Nikki DomCrawford, Ommie Degeorge 05/13/2017, 2:28 PM Viewed order from doctor's order list

## 2017-05-13 NOTE — Interval H&P Note (Signed)
History and Physical Interval Note:  05/13/2017 9:43 AM  Lauren Boyle  has presented today for surgery, with the diagnosis of OA LEFT KNEE  The various methods of treatment have been discussed with the patient and family. After consideration of risks, benefits and other options for treatment, the patient has consented to  Procedure(s): LEFT TOTAL KNEE ARTHROPLASTY (Left) as a surgical intervention .  The patient's history has been reviewed, patient examined, no change in status, stable for surgery.  I have reviewed the patient's chart and labs.  Questions were answered to the patient's satisfaction.     [name] JR,W D   

## 2017-05-13 NOTE — H&P (View-Only) (Signed)
TOTAL KNEE ADMISSION H&P  Patient is being admitted for left total knee arthroplasty.  Subjective:  Chief Complaint:left knee pain.  HPI: Lenn SinkStephanie Duhamel, 51 y.o. female, has a history of pain and functional disability in the left knee due to arthritis and has failed non-surgical conservative treatments for greater than 12 weeks to includeNSAID's and/or analgesics, corticosteriod injections, viscosupplementation injections and activity modification.  Onset of symptoms was gradual, starting 5 years ago with gradually worsening course since that time. The patient noted prior procedures on the knee to include  arthroscopy and menisectomy on the left knee(s).  Patient currently rates pain in the left knee(s) at 8 out of 10 with activity. Patient has night pain, worsening of pain with activity and weight bearing, pain that interferes with activities of daily living, pain with passive range of motion, crepitus and joint swelling.  Patient has evidence of joint space narrowing, periarticular osteophytes by imaging studies. There is no active infection.  Patient Active Problem List   Diagnosis Date Noted  . Adrenal mass, right (HCC) 05/27/2014  . Low back pain 07/04/2013   Past Medical History:  Diagnosis Date  . Arthritis   . Chicken pox   . Depression   . History of meniscal tear    left  . Iron deficiency     Past Surgical History:  Procedure Laterality Date  . ABDOMINAL HYSTERECTOMY     partial  . CHONDROPLASTY Left 09/17/2016   Procedure: CHONDROPLASTY;  Surgeon: Frederico Hammananiel Caffrey, MD;  Location: [facility];  Service: Orthopedics;  Laterality: Left;  . COLONOSCOPY    . KNEE ARTHROSCOPY WITH MEDIAL MENISECTOMY Left 09/17/2016   Procedure: KNEE ARTHROSCOPY WITH PARTIAL MEDIAL MENISECTOMY;  Surgeon: Frederico Hammananiel Caffrey, MD;  Location: [facility];  Service: Orthopedics;  Laterality: Left;  . ORIF ULNAR FRACTURE Left 04/14/2015   Procedure: OPEN REDUCTION INTERNAL  FIXATION (ORIF) ULNAR FRACTURE;  Surgeon: Eldred MangesMark C Yates, MD;  Location: [facility];  Service: Orthopedics;  Laterality: Left;     (Not in a hospital admission) Allergies  Allergen Reactions  . Penicillins Anaphylaxis and Itching    Has patient had a PCN reaction causing immediate rash, facial/tongue/throat swelling, SOB or lightheadedness with hypotension: Yes. Has patient had a PCN reaction causing severe rash involving mucus membranes or skin necrosis: Yes Has patient had a PCN reaction that required hospitalization- No.- had to be given IV medication. Has patient had a PCN reaction occurring within the last 10 years: No. If all of the above answers are "NO", then may proceed with Cephalosporin use.     Social History  Substance Use Topics  . Smoking status: Current Some Day Smoker    Packs/day: 0.25    Types: Cigarettes  . Smokeless tobacco: Never Used  . Alcohol use 4.2 oz/week    7 Glasses of wine per week     Comment: social    Family History  Problem Relation Age of Onset  . Hyperlipidemia Mother   . Hypertension Mother   . Hypertension Father   . Hyperlipidemia Father   . Diabetes Father   . Cancer Sister        ovarian  . Heart attack Neg Hx   . Sudden death Neg Hx      Review of Systems  Gastrointestinal: Positive for constipation.  Musculoskeletal: Positive for joint pain.  All other systems reviewed and are negative.   Objective:  Physical Exam  Constitutional: She is oriented to person, place, and time. She  appears well-developed and well-nourished. No distress.  HENT:  Head: Normocephalic and atraumatic.  Nose: Nose normal.  Eyes: Pupils are equal, round, and reactive to light. Conjunctivae and EOM are normal.  Neck: Normal range of motion. Neck supple.  Cardiovascular: Normal rate, regular rhythm, normal heart sounds and intact distal pulses.   Respiratory: Effort normal. No respiratory distress. She has no wheezes.  GI: Soft. She exhibits no distension.  Bowel sounds are decreased. There is no tenderness.  Musculoskeletal:       Left knee: She exhibits decreased range of motion and swelling. Tenderness found.  Lymphadenopathy:    She has no cervical adenopathy.  Neurological: She is alert and oriented to person, place, and time. No cranial nerve deficit.  Skin: Skin is warm and dry. No rash noted. No erythema.  Psychiatric: She has a normal mood and affect. Her behavior is normal.    Vital signs in last 24 hours: @  Labs:   Estimated body mass index is 32.4 kg/m as calculated from the following:   Height as of 04/11/17:  (1.651 m).   Weight as of 04/18/17: 88.3 kg (194 lb 11.2 oz).   Imaging Review Plain radiographs demonstrate moderate degenerative joint disease of the left knee(s). The overall alignment ismild varus. The bone quality appears to be good for age and reported activity level.  Assessment/Plan:  End stage arthritis, left knee   The patient history, physical examination, clinical judgment of the provider and imaging studies are consistent with end stage degenerative joint disease of the left knee(s) and total knee arthroplasty is deemed medically necessary. The treatment options including medical management, injection therapy arthroscopy and arthroplasty were discussed at length. The risks and benefits of total knee arthroplasty were presented and reviewed. The risks due to aseptic loosening, infection, stiffness, patella tracking problems, thromboembolic complications and other imponderables were discussed. The patient acknowledged the explanation, agreed to proceed with the plan and consent was signed. Patient is being admitted for inpatient treatment for surgery, pain control, PT, OT, prophylactic antibiotics, VTE prophylaxis, progressive ambulation and ADL's and discharge planning. The patient is planning to be discharged home with home health services

## 2017-05-13 NOTE — Interval H&P Note (Signed)
History and Physical Interval Note:  05/13/2017 9:43 AM  Lauren SinkStephanie Lyles  has presented today for surgery, with the diagnosis of OA LEFT KNEE  The various methods of treatment have been discussed with the patient and family. After consideration of risks, benefits and other options for treatment, the patient has consented to  Procedure(s): LEFT TOTAL KNEE ARTHROPLASTY (Left) as a surgical intervention .  The patient's history has been reviewed, patient examined, no change in status, stable for surgery.  I have reviewed the patient's chart and labs.  Questions were answered to the patient's satisfaction.     Hermila Millis JR,W D

## 2017-05-13 NOTE — Brief Op Note (Signed)
05/13/2017  12:52 PM  PATIENT:  Lauren Boyle  51 y.o. female  PRE-OPERATIVE DIAGNOSIS:  OA LEFT KNEE  POST-OPERATIVE DIAGNOSIS:  OA LEFT KNEE  PROCEDURE:  Procedure(s): LEFT TOTAL KNEE ARTHROPLASTY (Left)  SURGEON:  Surgeon(s) and Role:    Frederico Hamman* Caffrey, Daniel, MD - Primary  PHYSICIAN ASSISTANT: Margart SicklesJoshua Kamil Hanigan, PA-C  ASSISTANTS:   ANESTHESIA:   local, spinal and IV sedation  EBL:    BLOOD ADMINISTERED:none  DRAINS: none   LOCAL MEDICATIONS USED:  MARCAINE     SPECIMEN:  No Specimen  DISPOSITION OF SPECIMEN:  N/A  COUNTS:  YES  TOURNIQUET:   Total Tourniquet Time Documented: Thigh (Left) - 54 minutes Total: Thigh (Left) - 54 minutes   DICTATION: .Other Dictation: Dictation Number unknown  PLAN OF CARE: Admit to inpatient   PATIENT DISPOSITION:  PACU - hemodynamically stable.   Delay start of Pharmacological VTE agent (>24hrs) due to surgical blood loss or risk of bleeding: yes

## 2017-05-13 NOTE — Transfer of Care (Signed)
Immediate Anesthesia Transfer of Care Note  Patient: Lauren Boyle  Procedure(s) Performed: LEFT TOTAL KNEE ARTHROPLASTY (Left Knee)  Patient Location: PACU  Anesthesia Type:MAC, Regional and Spinal  Level of Consciousness: awake, alert , oriented and sedated  Airway & Oxygen Therapy: Patient Spontanous Breathing and Patient connected to nasal cannula oxygen  Post-op Assessment: Report given to RN, Post -op Vital signs reviewed and stable and Patient moving all extremities  Post vital signs: Reviewed and stable  Last Vitals:  Vitals:   05/13/17 1002 05/13/17 1306  BP:    Pulse: 60   Resp: 15   Temp:  (!) 36.1 C  SpO2: 98%     Last Pain:  Vitals:   05/13/17 0957  TempSrc:   PainSc: 0-No pain         Complications: No apparent anesthesia complications

## 2017-05-14 LAB — CBC
HEMATOCRIT: 33.3 % — AB (ref 36.0–46.0)
HEMOGLOBIN: 10.7 g/dL — AB (ref 12.0–15.0)
MCH: 27.5 pg (ref 26.0–34.0)
MCHC: 32.1 g/dL (ref 30.0–36.0)
MCV: 85.6 fL (ref 78.0–100.0)
Platelets: 357 10*3/uL (ref 150–400)
RBC: 3.89 MIL/uL (ref 3.87–5.11)
RDW: 14.7 % (ref 11.5–15.5)
WBC: 12 10*3/uL — ABNORMAL HIGH (ref 4.0–10.5)

## 2017-05-14 LAB — BASIC METABOLIC PANEL
Anion gap: 8 (ref 5–15)
BUN: 7 mg/dL (ref 6–20)
CALCIUM: 8.9 mg/dL (ref 8.9–10.3)
CHLORIDE: 102 mmol/L (ref 101–111)
CO2: 24 mmol/L (ref 22–32)
CREATININE: 0.73 mg/dL (ref 0.44–1.00)
GFR calc non Af Amer: >60 mL/min (ref >60)
GLUCOSE: 130 mg/dL — AB (ref 65–99)
Potassium: 3.7 mmol/L (ref 3.5–5.1)
Sodium: 134 mmol/L — ABNORMAL LOW (ref 135–145)

## 2017-05-14 MED ORDER — WHITE PETROLATUM EX OINT
TOPICAL_OINTMENT | CUTANEOUS | Status: AC
  Filled 2017-05-14: qty 28.35

## 2017-05-14 NOTE — Op Note (Deleted)
  The note originally documented on this encounter has been moved the the encounter in which it belongs.  

## 2017-05-14 NOTE — Progress Notes (Signed)
Subjective: 1 Day Post-Op Procedure(s) (LRB): LEFT TOTAL KNEE ARTHROPLASTY (Left) Patient reports pain as mild.  Had a fall last night walking unattended to the bedside commode.  Slight pain to op knee, but ok this am.    Objective: Vital signs in last 24 hours: Temp:  [97 F (36.1 C)-98.2 F (36.8 C)] 98.2 F (36.8 C) (10/19 2110) Pulse Rate:  [49-110] 107 (10/19 2110) Resp:  [9-22] 17 (10/19 2110) BP: (83-134)/(53-85) 129/75 (10/19 2110) SpO2:  [90 %-100 %] 97 % (10/19 2110)  Intake/Output from previous day: 10/19 0701 - 10/20 0700 In: 1510 [I.V.:1150; IV Piggyback:360] Out: 250 [Urine:200; Blood:50] Intake/Output this shift: Total I/O In: 240 [P.O.:240] Out: -    Recent Labs  05/14/17 0632  HGB 10.7*    Recent Labs  05/14/17 0632  WBC 12.0*  RBC 3.89  HCT 33.3*  PLT 357    Recent Labs  05/14/17 0632  NA 134*  K 3.7  CL 102  CO2 24  BUN 7  CREATININE 0.73  GLUCOSE 130*  CALCIUM 8.9   No results for input(s): LABPT, INR in the last 72 hours.  Neurologically intact Neurovascular intact Sensation intact distally Intact pulses distally Dorsiflexion/Plantar flexion intact Incision: dressing C/D/I No cellulitis present Compartment soft  Assessment/Plan: 1 Day Post-Op Procedure(s) (LRB): LEFT TOTAL KNEE ARTHROPLASTY (Left) Advance diet Up with therapy Plan for discharge tomorrow home with hhpt WBAT LLE Dry dressing change prn  Otilio SaberM Lindsey Japji Kok 05/14/2017, 9:51 AM

## 2017-05-14 NOTE — Op Note (Signed)
NAMCari Boyle:  Lauren Boyle, Lauren Boyle            ACCOUNT NO.:  [id number]  MEDICAL RECORD NO.:  [id number]  LOCATION:  [id number]                          FACILITY:  [facility]  PHYSICIAN:  Dyke BrackettW. D. Vivika Poythress, M.D.    DATE OF BIRTH:  1966-06-05  DATE OF PROCEDURE:  05/13/2017 DATE OF DISCHARGE:  05/16/2017                              OPERATIVE REPORT   PREOPERATIVE DIAGNOSIS:  Severe osteoarthritis, left knee, with varus deformity.  POSTOPERATIVE DIAGNOSIS:  Severe osteoarthritis, left knee, with varus deformity.  OPERATION:  Left total knee replacement (Sigma size 3 femur, tibia with 12.5-mm bearing and 35-mm all-poly patella.  SURGEON:  Dyke BrackettW. D. Tayva Easterday, MD.  ASSISTANVincent Peyer:  [place], PA.  Spinal anesthetic.  TOURNIQUET TIME:  54 minutes.  DESCRIPTION OF PROCEDURE:  Sterile prep and drape, exsanguination of the leg and inflation of thigh tourniquet at 350, straight skin incision with medial parapatellar approach to the knee made.  Did an 11-mm 5- degree valgus cut on the femur followed by cutting about 3-4 mm below the most-diseased medial compartment.  Extension gap was measured at 10 and 12.5 mm.  Femur was sized to be a size 3 followed by placement all- in-1 cutting block in the appropriate degree of external rotation and cutting the anterior and posterior cuts as well as the chamfer cuts. PCL was released.  Excess menisci were removed from posterior aspect of the knee, stripping of the capsule and the flexion gap was matched to the extension gap at 12.5 mm.  Carolin GuernseyKeel was cut for the tibia followed by the box cut for the femur.  Femoral and tibial trials were placed and again we tried the 10 and 12.5.  Felt that there was slight hyperextension with the 10, but 12.5 was fully extended.  Likewise, good stability and flexion with 12.5 compared to the 10.  Patella was cut leaving about 15 mm of native patella followed by 35-mm all-poly trial. Cement was prepared on the back table.  Pulse lavage was used on  the bone.  We inserted the cement in a doughy state, coated the bone of tibia followed by femur using a trial bearing while the cement hardened. Final patella was placed at that time.  Trial was removed.  Small bits of cement were removed from posterior aspect of the knee.  Tourniquet was released with the bearing out.  No excessive bleeding was noted. 12.5 bearing was placed followed by closure with #1 Ethibond, 2-0 Vicryl, and skin clips.  A mixture of Exparel and Marcaine with epinephrine was placed into the capsule and subcutaneous tissues.  Taken to the recovery room in stable condition.    Dyke BrackettW. D. Haleigh Desmith, M.D.    WDC/MEDQ  D:  05/13/2017  T:  05/14/2017  Job:  [id number]

## 2017-05-14 NOTE — Progress Notes (Signed)
OT Cancellation    05/14/17 1000  OT Visit Information  Last OT Received On 05/14/17  Reason Eval/Treat Not Completed Patient declined, no reason specified (Pt reporting significant pain but agrees to participate a little later. Will return as schedule allows.)   Abbygael Curtiss MSOT, OTR/L Acute Rehab Pager: [phone] Office: [phone]

## 2017-05-14 NOTE — Progress Notes (Signed)
Physical Therapy Treatment Patient Details Name: Lauren Boyle MRN: [id number] DOB: Jun 26, 1966 Today's Date: 05/14/2017    History of Present Illness Pt is a 51 y.o. female s/p elective L TKA on 05/13/17. Pertinent PMH includes OA, depression.    PT Comments    Pt progressing with mobility, able to improve amb distance this session with RW and supervision for safety. Educ on proper gait mechanics, but pt still hesitant to increase WBAT through LLE secondary to pain despite cues and encouragement. Increased time and effort for all mobility secondary to pain. Will plan for stair training tomorrow.    Follow Up Recommendations  DC plan and follow up therapy as arranged by surgeon;Home health PT     Equipment Recommendations  None recommended by PT (DME delivered to room)    Recommendations for Other Services       Precautions / Restrictions Precautions Precautions: Knee Knee Immobilizer - Left: On when out of bed or walking;Other (comment) (Additional order to discontinue KI on 05/14/17) Restrictions Weight Bearing Restrictions: Yes LLE Weight Bearing: Weight bearing as tolerated    Mobility  Bed Mobility Overal bed mobility: Modified Independent Bed Mobility: Supine to Sit;Sit to Supine           General bed mobility comments: Pt able to use BUEs and RLE to assist LLE to EOB; cues for technique  Transfers Overall transfer level: Needs assistance Equipment used: Rolling walker (2 wheeled) Transfers: Sit to/from Stand Sit to Stand: Min guard            Ambulation/Gait Ambulation/Gait assistance: Min guard;Supervision Ambulation Distance (Feet): 150 Feet Assistive device: Rolling walker (2 wheeled)   Gait velocity: Decreased Gait velocity interpretation: <1.8 ft/sec, indicative of risk for recurrent falls General Gait Details: Slow, antalgic amb with RW and initial min guard for balance, progressing to supervision. Pt very hesitant to WB through LLE, heavily  reliant on BUEs to offload L knee secondary to pain; encouraged WBAT and heel-to-toe gait pattern, but pt did not change gait pattern   Stairs            Wheelchair Mobility    Modified Rankin (Stroke Patients Only)       Balance Overall balance assessment: Needs assistance Sitting-balance support: No upper extremity supported;Feet supported;Feet unsupported Sitting balance-Leahy Scale: Good     Standing balance support: Bilateral upper extremity supported Standing balance-Leahy Scale: Poor                              Cognition Arousal/Alertness: Awake/alert Behavior During Therapy: WFL for tasks assessed/performed Overall Cognitive Status: Within Functional Limits for tasks assessed                                        Exercises Total Joint Exercises Ankle Circles/Pumps: AROM;Both;10 reps;Seated Quad Sets: AROM;Both;10 reps;Seated Short Arc Quad: AROM;Left;10 reps Heel Slides: AAROM;Left;10 reps Hip ABduction/ADduction: AROM;Left;10 reps Straight Leg Raises: AROM;Left;10 reps;AAROM Goniometric ROM: approx 75 degrees flexion    General Comments        Pertinent Vitals/Pain Pain Assessment: Faces Faces Pain Scale: Hurts even more Pain Location: L anterior thigh Pain Descriptors / Indicators: Aching;Grimacing;Guarding Pain Intervention(s): Monitored during session;Ice applied    Home Living                      Prior Function  PT Goals (current goals can now be found in the care plan section) Acute Rehab PT Goals Patient Stated Goal: Decreased pain PT Goal Formulation: With patient Time For Goal Achievement: 05/27/17 Potential to Achieve Goals: Good Progress towards PT goals: Progressing toward goals    Frequency    7X/week      PT Plan Current plan remains appropriate    Co-evaluation              AM-PAC PT "6 Clicks" Daily Activity  Outcome Measure  Difficulty turning over in  bed (including adjusting bedclothes, sheets and blankets)?: A Little Difficulty moving from lying on back to sitting on the side of the bed? : A Little Difficulty sitting down on and standing up from a chair with arms (e.g., wheelchair, bedside commode, etc,.)?: A Little Help needed moving to and from a bed to chair (including a wheelchair)?: A Little Help needed walking in hospital room?: A Little Help needed climbing 3-5 steps with a railing? : A Little 6 Click Score: 18    End of Session Equipment Utilized During Treatment: Gait belt Activity Tolerance: Patient tolerated treatment well Patient left: in bed;with call bell/phone within reach Nurse Communication: Mobility status PT Visit Diagnosis: Other abnormalities of gait and mobility (R26.89);Pain Pain - Right/Left: Left Pain - part of body: Leg     Time: [id number] PT Time Calculation (min) (ACUTE ONLY): 23 min  Charges:  $Gait Training: 23-37 mins $Therapeutic Exercise: 23-37 mins                    G Codes:      Lauren Boyle, PT, DPT Acute Rehab Services  Pager: [phone]  Lauren Boyle 05/14/2017, 5:28 PM

## 2017-05-14 NOTE — Progress Notes (Signed)
Physical Therapy Treatment Patient Details Name: Lauren Boyle MRN: [id number] DOB: 08/19/1965 Today's Date: 05/14/2017    History of Present Illness Pt is a 51 y.o. female s/p elective L TKA on 05/13/17. Pertinent PMH includes OA, depression.     PT Comments    This session focused LE therex which pt tolerated well. Pt required less assist for L LE mobility. Pt needs to progress gait and attempt stairs next session.    Follow Up Recommendations  DC plan and follow up therapy as arranged by surgeon;Home health PT     Equipment Recommendations  None recommended by PT (DME delivered to room)    Recommendations for Other Services       Precautions / Restrictions Precautions Precautions: Knee Required Braces or Orthoses: Knee Immobilizer - Left Knee Immobilizer - Left: On when out of bed or walking;Other (comment) (Additional order to discontinue KI on 05/14/17) Restrictions Weight Bearing Restrictions: Yes LLE Weight Bearing: Weight bearing as tolerated    Mobility  Bed Mobility Overal bed mobility: Needs Assistance Bed Mobility: Supine to Sit;Sit to Supine     Supine to sit: Min assist Sit to supine: Min assist   General bed mobility comments: Educated pt on using RLE to assist LLE to EOB. Pt requiring Min A for safety and Min A to elevate LLE over EOB when returning to supine  Transfers Overall transfer level: Needs assistance Equipment used: Rolling walker (2 wheeled) Transfers: Sit to/from Stand Sit to Stand: Min guard         General transfer comment: Educ on hand placement for RW  Ambulation/Gait                 Stairs            Wheelchair Mobility    Modified Rankin (Stroke Patients Only)       Balance Overall balance assessment: Needs assistance Sitting-balance support: No upper extremity supported;Feet supported;Feet unsupported Sitting balance-Leahy Scale: Good     Standing balance support: Bilateral upper extremity  supported Standing balance-Leahy Scale: Poor                              Cognition Arousal/Alertness: Awake/alert Behavior During Therapy: WFL for tasks assessed/performed Overall Cognitive Status: Within Functional Limits for tasks assessed                                 General Comments: Pt slightly impulsive      Exercises Total Joint Exercises Ankle Circles/Pumps: AROM;Both;10 reps;Seated Quad Sets: AROM;Both;10 reps;Seated Short Arc Quad: AROM;Left;10 reps Heel Slides: AAROM;Left;10 reps Hip ABduction/ADduction: AROM;Left;10 reps Straight Leg Raises: AROM;Left;10 reps;AAROM Goniometric ROM: approx 75 degrees flexion    General Comments General comments (skin integrity, edema, etc.): Educated pt on resting with knee in extension and icing      Pertinent Vitals/Pain Pain Assessment: Faces Faces Pain Scale: Hurts little more Pain Location: L anterior thigh Pain Descriptors / Indicators: Aching;Grimacing;Guarding Pain Intervention(s): Limited activity within patient's tolerance;Monitored during session;Premedicated before session;Repositioned;Ice applied    Home Living Family/patient expects to be discharged to:: Private residence Living Arrangements: Spouse/significant other Available Help at Discharge: Family;Available PRN/intermittently (Husband works) Type of Home: House Home Access: Stairs to enter Entrance Stairs-Rails: Right;Left Home Layout: One level Home Equipment: None Additional Comments: BSC and RW in room    Prior Function Level of Independence: Independent  Comments: ADLs, IADLs, driving   PT Goals (current goals can now be found in the care plan section) Acute Rehab PT Goals Patient Stated Goal: Decreased pain PT Goal Formulation: With patient Time For Goal Achievement: 05/27/17 Potential to Achieve Goals: Good Progress towards PT goals: Progressing toward goals    Frequency    7X/week      PT Plan  Current plan remains appropriate    Co-evaluation              AM-PAC PT "6 Clicks" Daily Activity  Outcome Measure  Difficulty turning over in bed (including adjusting bedclothes, sheets and blankets)?: Unable Difficulty moving from lying on back to sitting on the side of the bed? : Unable Difficulty sitting down on and standing up from a chair with arms (e.g., wheelchair, bedside commode, etc,.)?: A Little Help needed moving to and from a bed to chair (including a wheelchair)?: A Little Help needed walking in hospital room?: A Little Help needed climbing 3-5 steps with a railing? : A Lot 6 Click Score: 13    End of Session   Activity Tolerance: Patient tolerated treatment well Patient left: with call bell/phone within reach;with family/visitor present;in bed Nurse Communication: Mobility status PT Visit Diagnosis: Other abnormalities of gait and mobility (R26.89);Pain Pain - Right/Left: Left Pain - part of body: Leg     Time: 1610-9604 PT Time Calculation (min) (ACUTE ONLY): 25 min  Charges:  $Therapeutic Exercise: 23-37 mins                    G Codes:       Erline Levine, PTA Pager: [phone]     Carolynne Edouard 05/14/2017, 2:00 PM

## 2017-05-14 NOTE — Progress Notes (Signed)
Around 2000 last night, patient was calling out from room.  Day shift charge nurse went to check on patient. Patient was found on floor in room.  Patient used called bell but did not wait for staff and tried to go to bathroom w/o assistance.  Patient had un-witnessed fall.  She stated she fell on her left side.  She denied hitting her head.  No injuries noted on assessment.  Dressing to left knee intact however patient was observed pulling dressing back, she stated "I just want to make sure it's not bleeding".  No bleeding noted on assessment.  On -call contacted.  No new orders given.  On-call will be rounding this morning on patient.  Patient called her spouse on her cell phone and explained to spouse what happened.  [facility]  She was re-educated on using telephone to contact staff.  Patient did not want staff to put bed alarm on.  Patient stated "now I'm being treated like I'm in a nursing home"! Patient has since been using telephone to contact staff with needs.  She is more calm and less emotional.

## 2017-05-14 NOTE — Evaluation (Signed)
Occupational Therapy Evaluation Patient Details Name: Lauren SinkStephanie Mooradian MRN: [id number] DOB: 09/23/1965 Today's Date: 05/14/2017    History of Present Illness Pt is a 51 y.o. female s/p elective L TKA on 05/13/17. Pertinent PMH includes OA, depression.    Clinical Impression   PTA, pt was living with her husband and was independent. Currently, pt requires Min-Mod A for LB ADLs and Min A for functional mobility using RW. Provided education on LB ADLs and toilet transfer with 3N1. Pt would benefit from further acute OT to address tub transfer and facilitate safe dc. Recommend dc home once medically stable per physician.      Follow Up Recommendations  DC plan and follow up therapy as arranged by surgeon;Supervision/Assistance - 24 hour    Equipment Recommendations  None recommended by OT;Other (comment) (BSC and RW in pt's room upon arrival)    Recommendations for Other Services PT consult     Precautions / Restrictions Precautions Precautions: Knee Required Braces or Orthoses: Knee Immobilizer - Left Knee Immobilizer - Left: On when out of bed or walking;Other (comment) (Additional order to discontinue KI on 05/14/17) Restrictions Weight Bearing Restrictions: Yes LLE Weight Bearing: Weight bearing as tolerated      Mobility Bed Mobility Overal bed mobility: Needs Assistance Bed Mobility: Supine to Sit;Sit to Supine     Supine to sit: Min assist Sit to supine: Min assist   General bed mobility comments: Educated pt on using RLE to assist LLE to EOB. Pt requiring Min A for safety and Min A to elevate LLE over EOB when returning to supine  Transfers Overall transfer level: Needs assistance Equipment used: Rolling walker (2 wheeled) Transfers: Sit to/from Stand Sit to Stand: Min guard         General transfer comment: Educ on hand placement for RW    Balance Overall balance assessment: Needs assistance Sitting-balance support: No upper extremity supported;Feet  supported;Feet unsupported Sitting balance-Leahy Scale: Good     Standing balance support: Bilateral upper extremity supported Standing balance-Leahy Scale: Poor                             ADL either performed or assessed with clinical judgement   ADL Overall ADL's : Needs assistance/impaired Eating/Feeding: Set up;Sitting   Grooming: Minimal assistance;Min guard;Standing;Wash/dry hands;Wash/dry face;Oral care Grooming Details (indicate cue type and reason): Pt Min Guard A for grooming at [facility]. However, required Min A for occasional posterior lean due to pain Upper Body Bathing: Sitting;Set up;Supervision/ safety   Lower Body Bathing: Minimal assistance;Sit to/from stand   Upper Body Dressing : Supervision/safety;Set up;Sitting   Lower Body Dressing: Sit to/from stand;Moderate assistance Lower Body Dressing Details (indicate cue type and reason): Pt able to don R sock. Required A for L sock Toilet Transfer: Minimal assistance;Ambulation;BSC;RW;Grab bars;Cueing for safety;Cueing for sequencing   Toileting- Clothing Manipulation and Hygiene: Minimal assistance;Sit to/from stand;Cueing for sequencing;Cueing for safety     Tub/Shower Transfer Details (indicate cue type and reason): Will need education on tub transfer Functional mobility during ADLs: Minimal assistance;Rolling walker General ADL Comments: Pt demonstrating decreased fucntional performance post-surgery. Pt limited by significant pain.     Vision         Perception     Praxis      Pertinent Vitals/Pain Pain Assessment: Faces Faces Pain Scale: Hurts whole lot Pain Location: L anterior thigh Pain Descriptors / Indicators: Grimacing;Moaning;Guarding;Constant Pain Intervention(s): Monitored during session;Limited activity within patient's tolerance;Repositioned  Hand Dominance Right   Extremity/Trunk Assessment Upper Extremity Assessment Upper Extremity Assessment: Overall WFL for tasks  assessed   Lower Extremity Assessment Lower Extremity Assessment: Defer to PT evaluation LLE Deficits / Details: s/p L TKA; hip flexion grossly 3/5   Cervical / Trunk Assessment Cervical / Trunk Assessment: Normal   Communication Communication Communication: No difficulties   Cognition Arousal/Alertness: Awake/alert Behavior During Therapy: WFL for tasks assessed/performed Overall Cognitive Status: Within Functional Limits for tasks assessed                                 General Comments: Pt slightly impulsive   General Comments  Educated pt on resting with knee in extension and icing    Exercises     Shoulder Instructions      Home Living Family/patient expects to be discharged to:: Private residence Living Arrangements: Spouse/significant other Available Help at Discharge: Family;Available PRN/intermittently (Husband works) Type of Home: House Home Access: Stairs to enter Secretary/administrator of Steps: 2 Entrance Stairs-Rails: Right;Left Home Layout: One level     Bathroom Shower/Tub: Chief Strategy Officer: Standard     Home Equipment: None   Additional Comments: BSC and RW in room      Prior Functioning/Environment Level of Independence: Independent        Comments: ADLs, IADLs, driving        OT Problem List: Decreased range of motion;Decreased activity tolerance;Impaired balance (sitting and/or standing);Decreased safety awareness;Decreased knowledge of use of DME or AE;Decreased knowledge of precautions;Pain      OT Treatment/Interventions: Self-care/ADL training;Therapeutic exercise;Energy conservation;DME and/or AE instruction;Therapeutic activities;Patient/family education    OT Goals(Current goals can be found in the care plan section) Acute Rehab OT Goals Patient Stated Goal: Decreased pain OT Goal Formulation: With patient Time For Goal Achievement: 05/28/17 Potential to Achieve Goals: Good ADL Goals Pt Will  Perform Grooming: with set-up;with supervision;standing Pt Will Perform Lower Body Dressing: with set-up;with supervision;with adaptive equipment;sit to/from stand Pt Will Transfer to Toilet: with set-up;with supervision;bedside commode;ambulating Pt Will Perform Tub/Shower Transfer: Tub transfer;with set-up;with supervision;ambulating;3 in 1;rolling walker  OT Frequency: Min 2X/week   Barriers to D/C:            Co-evaluation              AM-PAC PT "6 Clicks" Daily Activity     Outcome Measure Help from another person eating meals?: None Help from another person taking care of personal grooming?: A Little Help from another person toileting, which includes using toliet, bedpan, or urinal?: A Little Help from another person bathing (including washing, rinsing, drying)?: A Little Help from another person to put on and taking off regular upper body clothing?: None Help from another person to put on and taking off regular lower body clothing?: A Lot 6 Click Score: 19   End of Session Equipment Utilized During Treatment: Gait belt;Rolling walker Nurse Communication: Mobility status;Patient requests pain meds  Activity Tolerance: Patient tolerated treatment well;Patient limited by pain Patient left: in bed;with call bell/phone within reach  OT Visit Diagnosis: Unsteadiness on feet (R26.81);Other abnormalities of gait and mobility (R26.89);Muscle weakness (generalized) (M62.81);Pain Pain - Right/Left: Left Pain - part of body: Knee                Time: 1610-9604 OT Time Calculation (min): 23 min Charges:  OT General Charges $OT Visit: 1 Visit OT Evaluation $OT Eval Moderate Complexity: 1  Mod OT Treatments $Self Care/Home Management : 8-22 mins G-Codes:     Jameer Storie MSOT, OTR/L Acute Rehab Pager: [phone] Office: [phone]  Theodoro Grist Deondrick Searls 05/14/2017, 12:55 PM

## 2017-05-15 LAB — CBC
HCT: 32.6 % — ABNORMAL LOW (ref 36.0–46.0)
Hemoglobin: 10.4 g/dL — ABNORMAL LOW (ref 12.0–15.0)
MCH: 27.3 pg (ref 26.0–34.0)
MCHC: 31.9 g/dL (ref 30.0–36.0)
MCV: 85.6 fL (ref 78.0–100.0)
PLATELETS: 336 10*3/uL (ref 150–400)
RBC: 3.81 MIL/uL — ABNORMAL LOW (ref 3.87–5.11)
RDW: 14.5 % (ref 11.5–15.5)
WBC: 13.1 10*3/uL — AB (ref 4.0–10.5)

## 2017-05-15 NOTE — Progress Notes (Signed)
Physical Therapy Treatment Patient Details Name: Lauren Boyle MRN: [id number] DOB: 04/10/1966 Today's Date: 05/15/2017    History of Present Illness Pt is a 51 y.o. female s/p elective L TKA on 05/13/17. Pertinent PMH includes OA, depression.     PT Comments    Patient progressing slowly, but able to activate quads eccentrically with cues and time during exercise session this pm.  Continues to need assist with positioning as tendency to externally rotate L hip and keep knee flexed.  Will see in am prior to d/c for stair training with caregiver.    Follow Up Recommendations  DC plan and follow up therapy as arranged by surgeon;Home health PT     Equipment Recommendations  None recommended by PT    Recommendations for Other Services       Precautions / Restrictions Precautions Precautions: Knee Precaution Comments: Reviewed resting in extension Restrictions Weight Bearing Restrictions: Yes LLE Weight Bearing: Weight bearing as tolerated    Mobility  Bed Mobility Overal bed mobility: Modified Independent Bed Mobility: Supine to Sit;Sit to Supine     Supine to sit: Modified independent (Device/Increase time) Sit to supine: Modified independent (Device/Increase time)   General bed mobility comments: Pt able to use RLE under LLE to bring LLE EOB   Transfers Overall transfer level: Needs assistance Equipment used: Rolling walker (2 wheeled) Transfers: Sit to/from Stand Sit to Stand: Supervision         General transfer comment: for safety  Ambulation/Gait Ambulation/Gait assistance: Supervision Ambulation Distance (Feet): 250 Feet Assistive device: Rolling walker (2 wheeled) Gait Pattern/deviations: Step-to pattern;Step-through pattern;Decreased stride length;Trunk flexed;Antalgic     General Gait Details: cues for heel strike for improved knee extension during gait   Stairs            Wheelchair Mobility    Modified Rankin (Stroke Patients Only)       Balance Overall balance assessment: Needs assistance Sitting-balance support: No upper extremity supported;Feet supported;Feet unsupported Sitting balance-Leahy Scale: Good     Standing balance support: No upper extremity supported Standing balance-Leahy Scale: Fair Standing balance comment: standing for LE therex no UE support                            Cognition Arousal/Alertness: Awake/alert Behavior During Therapy: WFL for tasks assessed/performed Overall Cognitive Status: Within Functional Limits for tasks assessed                                        Exercises Total Joint Exercises Short Arc Quad: AAROM;5 reps;Supine Heel Slides: Left;10 reps;Seated;AROM Goniometric ROM: -20 to about 70 Other Exercises Other Exercises: standing knee extension on L with towel behind knee max cues for technique    General Comments General comments (skin integrity, edema, etc.): positioned hip in neutral rotation and extension at knee      Pertinent Vitals/Pain Pain Assessment: 0-10 Pain Score: 7  Pain Location: L anterior thigh Pain Descriptors / Indicators: Aching;Grimacing;Guarding Pain Intervention(s): Monitored during session;Repositioned;Premedicated before session    Home Living                      Prior Function            PT Goals (current goals can now be found in the care plan section) Acute Rehab PT Goals Patient Stated Goal: Decreased pain  Progress towards PT goals: Progressing toward goals    Frequency    7X/week      PT Plan Current plan remains appropriate    Co-evaluation              AM-PAC PT "6 Clicks" Daily Activity  Outcome Measure  Difficulty turning over in bed (including adjusting bedclothes, sheets and blankets)?: None Difficulty moving from lying on back to sitting on the side of the bed? : None Difficulty sitting down on and standing up from a chair with arms (e.g., wheelchair, bedside  commode, etc,.)?: A Little Help needed moving to and from a bed to chair (including a wheelchair)?: A Little Help needed walking in hospital room?: A Little Help needed climbing 3-5 steps with a railing? : A Little 6 Click Score: 20    End of Session   Activity Tolerance: Patient tolerated treatment well Patient left: with call bell/phone within reach;in bed   PT Visit Diagnosis: Other abnormalities of gait and mobility (R26.89);Pain Pain - Right/Left: Left Pain - part of body: Knee     Time: 1520-1545 PT Time Calculation (min) (ACUTE ONLY): 25 min  Charges:  $Gait Training: 8-22 mins $Therapeutic Exercise: 8-22 mins                    G CodesSheran Boyle:       Lauren [place], [place]PT [phone] 05/15/2017    Lauren [facility]le 05/15/2017, 3:48 PM

## 2017-05-15 NOTE — Progress Notes (Addendum)
Occupational Therapy Treatment Patient Details Name: Lauren Boy[facility] MRN: [id number] DOB: 12/30/1965 Today's Date: 05/15/2017    History of present illness Pt is a 51 y.o. female s/p elective L TKA on 05/13/17. Pertinent PMH includes OA, depression.    OT comments  Pt progressing towards established OT goals. Provided education on tub transfer and pt performed with Min A and VCs. Discussed different safe technique for carry over to home. Will continue to follow acutely to facilitate safe dc.    Follow Up Recommendations  DC plan and follow up therapy as arranged by surgeon;Supervision/Assistance - 24 hour    Equipment Recommendations  None recommended by OT    Recommendations for Other Services PT consult    Precautions / Restrictions Precautions Precautions: Knee Precaution Booklet Issued: Yes (comment) Precaution Comments: Reviewed resting in extension Restrictions Weight Bearing Restrictions: Yes LLE Weight Bearing: Weight bearing as tolerated       Mobility Bed Mobility Overal bed mobility: Modified Independent Bed Mobility: Supine to Sit;Sit to Supine     Supine to sit: Modified independent (Device/Increase time) Sit to supine: Modified independent (Device/Increase time)   General bed mobility comments: Pt able to use RLE under LLE to bring LLE EOB   Transfers Overall transfer level: Needs assistance Equipment used: Rolling walker (2 wheeled) Transfers: Sit to/from Stand Sit to Stand: Supervision         General transfer comment: supervision for safety from EOB. VCs for hand placement    Balance Overall balance assessment: Needs assistance Sitting-balance support: No upper extremity supported;Feet supported;Feet unsupported Sitting balance-Leahy Scale: Good     Standing balance support: Bilateral upper extremity supported;No upper extremity supported Standing balance-Leahy Scale: Fair Standing balance comment: able to stand briefly without UE support  in standing                           ADL either performed or assessed with clinical judgement   ADL Overall ADL's : Needs assistance/impaired                       Lower Body Dressing Details (indicate cue type and reason): Reviewed LB dressing technique. Pt verbalizing understanding to dress surgical leg first         Tub/ Shower Transfer: Tub transfer;Minimal assistance;3 in 1;Rolling walker;Ambulation;Cueing for sequencing;Cueing for safety Tub/Shower Transfer Details (indicate cue type and reason): Pt performing tub transfer with 3N1. Discussed different methods since pt has sliding glass doors. Pt performed transfer with Min A for safety. Functional mobility during ADLs: Min guard;Rolling walker General ADL Comments: Pt continues to requiring Min Guard-Min A. Provided education on tub trnasfer. Limited by pain but motivated to work through pain.     Vision       Perception     Praxis      Cognition Arousal/Alertness: Awake/alert Behavior During Therapy: WFL for tasks assessed/performed Overall Cognitive Status: Within Functional Limits for tasks assessed                                          Exercises    Shoulder Instructions       General Comments      Pertinent Vitals/ Pain       Pain Assessment: 0-10 Pain Score: 6  Faces Pain Scale: Hurts a little bit Pain Location: L anterior thigh  Pain Descriptors / Indicators: Aching;Grimacing;Guarding Pain Intervention(s): Monitored during session;Limited activity within patient's tolerance;Repositioned;Patient requesting pain meds-RN notified  Home Living                                          Prior Functioning/Environment              Frequency  Min 2X/week        Progress Toward Goals  OT Goals(current goals can now be found in the care plan section)  Progress towards OT goals: Progressing toward goals  Acute Rehab OT Goals Patient  Stated Goal: Decreased pain OT Goal Formulation: With patient Time For Goal Achievement: 05/28/17 Potential to Achieve Goals: Good ADL Goals Pt Will Perform Grooming: with set-up;with supervision;standing Pt Will Perform Lower Body Dressing: with set-up;with supervision;with adaptive equipment;sit to/from stand Pt Will Transfer to Toilet: with set-up;with supervision;bedside commode;ambulating Pt Will Perform Tub/Shower Transfer: Tub transfer;with set-up;with supervision;ambulating;3 in 1;rolling walker  Plan Discharge plan remains appropriate    Co-evaluation                 AM-PAC PT "6 Clicks" Daily Activity     Outcome Measure   Help from another person eating meals?: None Help from another person taking care of personal grooming?: A Little Help from another person toileting, which includes using toliet, bedpan, or urinal?: A Little Help from another person bathing (including washing, rinsing, drying)?: A Little Help from another person to put on and taking off regular upper body clothing?: None Help from another person to put on and taking off regular lower body clothing?: A Little 6 Click Score: 20    End of Session Equipment Utilized During Treatment: Gait belt;Rolling walker  OT Visit Diagnosis: Unsteadiness on feet (R26.81);Other abnormalities of gait and mobility (R26.89);Muscle weakness (generalized) (M62.81);Pain Pain - Right/Left: Left Pain - part of body: Knee   Activity Tolerance Patient tolerated treatment well   Patient Left in chair;with call bell/phone within reach   Nurse Communication Mobility status;Patient requests pain meds        Time: 5621-3086 OT Time Calculation (min): 26 min  Charges: OT General Charges $OT Visit: 1 Visit OT Treatments $Self Care/Home Management : 23-37 mins  Ximena Todaro MSOT, OTR/L Acute Rehab Pager: [phone] Office: [phone]   [name] 05/15/2017, 1:54 PM

## 2017-05-15 NOTE — Discharge Summary (Signed)
Patient ID: Lauren [facility] MRN: [id number] DOB/AGE: 53/09/1965 53 y.o.  Admit date: 05/13/2017 Discharge date: 05/15/2017  Admission Diagnoses:  Active Problems:   Primary localized osteoarthritis of left knee   Discharge Diagnoses:  Same  Past Medical History:  Diagnosis Date  . Arthritis   . Chicken pox   . Depression   . History of meniscal tear    left  . Iron deficiency   . Osteoarthritis of knee    left    Surgeries: Procedure(s): LEFT TOTAL KNEE ARTHROPLASTY on 05/13/2017   Consultants:   Discharged Condition: Improved  Hospital Course: Lauren SinkStephanie Boyle is an 51 y.o. female who was admitted 05/13/2017 for operative treatment of primary localized osteoarthritis left knee. Patient has severe unremitting pain that affects sleep, daily activities, and work/hobbies. After pre-op clearance the patient was taken to the operating room on 05/13/2017 and underwent  Procedure(s): LEFT TOTAL KNEE ARTHROPLASTY.    Patient was given perioperative antibiotics: Anti-infectives    Start     Dose/Rate Route Frequency Ordered Stop   05/13/17 2100  vancomycin (VANCOCIN) IVPB 1000 mg/200 mL premix     1,000 mg 200 mL/hr over 60 Minutes Intravenous Every 12 hours 05/13/17 1501 05/13/17 2210   05/13/17 0850  vancomycin (VANCOCIN) IVPB 1000 mg/200 mL premix     1,000 mg 200 mL/hr over 60 Minutes Intravenous On call to O.R. 05/13/17 0850 05/13/17 1126       Patient was given sequential compression devices, early ambulation, and chemoprophylaxis to prevent DVT.  Patient benefited maximally from hospital stay and there were no complications.    Recent vital signs: Patient Vitals for the past 24 hrs:  BP Temp Temp src Pulse Resp SpO2  05/15/17 0500 (!) 125/59 99.9 F (37.7 C) Oral 82 17 97 %  05/14/17 2100 99/63 99.8 F (37.7 C) Oral 74 17 100 %  05/14/17 1410 105/65 99.2 F (37.3 C) Oral 79 - 100 %     Recent laboratory studies:  Recent Labs  05/14/17 0632 05/15/17 0535   WBC 12.0* 13.1*  HGB 10.7* 10.4*  HCT 33.3* 32.6*  PLT 357 336  NA 134*  --   K 3.7  --   CL 102  --   CO2 24  --   BUN 7  --   CREATININE 0.73  --   GLUCOSE 130*  --   CALCIUM 8.9  --      Discharge Medications:   Allergies as of 05/15/2017      Reactions   Penicillins Anaphylaxis, Itching, Other (See Comments)   Has patient had a PCN reaction causing immediate rash, facial/tongue/throat swelling, SOB or lightheadedness with hypotension: Yes. Has patient had a PCN reaction causing severe rash involving mucus membranes or skin necrosis: Yes Has patient had a PCN reaction that required hospitalization- No.- had to be given IV medication. Has patient had a PCN reaction occurring within the last 10 years: No. If all of the above answers are "NO", then may proceed with Cephalosporin use.      Medication List    STOP taking these medications   polyethylene glycol powder powder Commonly known as:  GLYCOLAX/MIRALAX     TAKE these medications   apixaban 2.5 MG Tabs tablet Commonly known as:  ELIQUIS Take 1 tablet (2.5 mg total) by mouth 2 (two) times daily.   omeprazole 20 MG capsule Commonly known as:  PRILOSEC Take 1 capsule (20 mg total) by mouth daily.   oxyCODONE-acetaminophen 5-325 MG tablet Commonly  known as:  ROXICET Take 1-2 tablets by mouth every 4 (four) hours as needed for severe pain.            Durable Medical Equipment        Start     Ordered   05/13/17 1502  DME Walker rolling  Once    Question:  Patient needs a walker to treat with the following condition  Answer:  Primary localized osteoarthritis of left knee   05/13/17 1501   05/13/17 1502  DME 3 n 1  Once     05/13/17 1501      Diagnostic Studies: Dg Chest 2 View  Result Date: 05/02/2017 CLINICAL DATA:  Preoperative respiratory evaluation for knee replacement. EXAM: CHEST  2 VIEW COMPARISON:  07/23/2016 FINDINGS: The lungs are clear without focal pneumonia, edema, pneumothorax or  pleural effusion. The cardiopericardial silhouette is within normal limits for size. The visualized bony structures of the thorax are intact. IMPRESSION: Normal exam. Electronically Signed   By: Kennith Center M.D.   On: 05/02/2017 15:08    Disposition: 01-Home or Self Care    Follow-up Information    Frederico Hamman, MD. Schedule an appointment as soon as possible for a visit in 2 week(s).   Specialty:  Orthopedic Surgery Contact information: [address] [place] [address] [phone]        [facility] Follow up.   Specialty:  Home Health Services Why:  A representative from [facility] will call you to arrange start date and time for your therapy. Should you not receive call within 24-48hrs please call [phone].  Contact information: [address] [place] [address] [phone]            Signed: Otilio Saber 05/15/2017, 9:31 AM

## 2017-05-15 NOTE — Progress Notes (Addendum)
Subjective: 2 Days Post-Op Procedure(s) (LRB): LEFT TOTAL KNEE ARTHROPLASTY (Left) Patient reports pain as mild.  Feeling good this am.  Mobilized well with PT yesterday.  Objective: Vital signs in last 24 hours: Temp:  [99.2 F (37.3 C)-99.9 F (37.7 C)] 99.9 F (37.7 C) (10/21 0500) Pulse Rate:  [74-82] 82 (10/21 0500) Resp:  [17] 17 (10/21 0500) BP: (99-125)/(59-65) 125/59 (10/21 0500) SpO2:  [97 %-100 %] 97 % (10/21 0500)  Intake/Output from previous day: 10/20 0701 - 10/21 0700 In: 240 [P.O.:240] Out: -  Intake/Output this shift: No intake/output data recorded.   Recent Labs  05/14/17 0632 05/15/17 0535  HGB 10.7* 10.4*    Recent Labs  05/14/17 0632 05/15/17 0535  WBC 12.0* 13.1*  RBC 3.89 3.81*  HCT 33.3* 32.6*  PLT 357 336    Recent Labs  05/14/17 0632  NA 134*  K 3.7  CL 102  CO2 24  BUN 7  CREATININE 0.73  GLUCOSE 130*  CALCIUM 8.9   No results for input(s): LABPT, INR in the last 72 hours.  Neurologically intact Neurovascular intact Sensation intact distally Intact pulses distally Dorsiflexion/Plantar flexion intact Incision: dressing C/D/I No cellulitis present Compartment soft Dressing changed by me today  Assessment/Plan: 2 Days Post-Op Procedure(s) (LRB): LEFT TOTAL KNEE ARTHROPLASTY (Left) Advance diet Up with therapy Plan for discharge tomorrow home with hhpt WBAT LLE Please apply ted hose to BLE  ABLA-mild and stable Chronic hypokalemia Patient will need to stay an additional night to mobilize better with PT prior to d/c  Otilio SaberM Lindsey Jaiyanna Safran 05/15/2017, 9:50 AM

## 2017-05-15 NOTE — Discharge Instructions (Signed)
INSTRUCTIONS AFTER JOINT REPLACEMENT  ° °o Remove items at home which could result in a fall. This includes throw rugs or furniture in walking pathways °o ICE to the affected joint every three hours while awake for 30 minutes at a time, for at least the first 3-5 days, and then as needed for pain and swelling.  Continue to use ice for pain and swelling. You may notice swelling that will progress down to the foot and ankle.  This is normal after surgery.  Elevate your leg when you are not up walking on it.   °o Continue to use the breathing machine you got in the hospital (incentive spirometer) which will help keep your temperature down.  It is common for your temperature to cycle up and down following surgery, especially at night when you are not up moving around and exerting yourself.  The breathing machine keeps your lungs expanded and your temperature down. ° ° °DIET:  As you were doing prior to hospitalization, we recommend a well-balanced diet. ° °DRESSING / WOUND CARE / SHOWERING ° °You may change your dressing 3-5 days after surgery.  Then change the dressing every day with sterile gauze.  Please use good hand washing techniques before changing the dressing.  Do not use any lotions or creams on the incision until instructed by your surgeon. ° °ACTIVITY ° °o Increase activity slowly as tolerated, but follow the weight bearing instructions below.   °o No driving for 6 weeks or until further direction given by your physician.  You cannot drive while taking narcotics.  °o No lifting or carrying greater than 10 lbs. until further directed by your surgeon. °o Avoid periods of inactivity such as sitting longer than an hour when not asleep. This helps prevent blood clots.  °o You may return to work once you are authorized by your doctor.  ° ° ° °WEIGHT BEARING  ° °Weight bearing as tolerated with assist device (walker, cane, etc) as directed, use it as long as suggested by your surgeon or therapist, typically at  least 4-6 weeks. ° ° °EXERCISES ° °Results after joint replacement surgery are often greatly improved when you follow the exercise, range of motion and muscle strengthening exercises prescribed by your doctor. Safety measures are also important to protect the joint from further injury. Any time any of these exercises cause you to have increased pain or swelling, decrease what you are doing until you are comfortable again and then slowly increase them. If you have problems or questions, call your caregiver or physical therapist for advice.  ° °Rehabilitation is important following a joint replacement. After just a few days of immobilization, the muscles of the leg can become weakened and shrink (atrophy).  These exercises are designed to build up the tone and strength of the thigh and leg muscles and to improve motion. Often times heat used for twenty to thirty minutes before working out will loosen up your tissues and help with improving the range of motion but do not use heat for the first two weeks following surgery (sometimes heat can increase post-operative swelling).  ° °These exercises can be done on a training (exercise) mat, on the floor, on a table or on a bed. Use whatever works the best and is most comfortable for you.    Use music or television while you are exercising so that the exercises are a pleasant break in your day. This will make your life better with the exercises acting as a break   in your routine that you can look forward to.   Perform all exercises about fifteen times, three times per day or as directed.  You should exercise both the operative leg and the other leg as well. ° °Exercises include: °  °• Quad Sets - Tighten up the muscle on the front of the thigh (Quad) and hold for 5-10 seconds.   °• Straight Leg Raises - With your knee straight (if you were given a brace, keep it on), lift the leg to 60 degrees, hold for 3 seconds, and slowly lower the leg.  Perform this exercise against  resistance later as your leg gets stronger.  °• Leg Slides: Lying on your back, slowly slide your foot toward your buttocks, bending your knee up off the floor (only go as far as is comfortable). Then slowly slide your foot back down until your leg is flat on the floor again.  °• Angel Wings: Lying on your back spread your legs to the side as far apart as you can without causing discomfort.  °• Hamstring Strength:  Lying on your back, push your heel against the floor with your leg straight by tightening up the muscles of your buttocks.  Repeat, but this time bend your knee to a comfortable angle, and push your heel against the floor.  You may put a pillow under the heel to make it more comfortable if necessary.  ° °A rehabilitation program following joint replacement surgery can speed recovery and prevent re-injury in the future due to weakened muscles. Contact your doctor or a physical therapist for more information on knee rehabilitation.  ° ° °CONSTIPATION ° °Constipation is defined medically as fewer than three stools per week and severe constipation as less than one stool per week.  Even if you have a regular bowel pattern at home, your normal regimen is likely to be disrupted due to multiple reasons following surgery.  Combination of anesthesia, postoperative narcotics, change in appetite and fluid intake all can affect your bowels.  ° °YOU MUST use at least one of the following options; they are listed in order of increasing strength to get the job done.  They are all available over the counter, and you may need to use some, POSSIBLY even all of these options:   ° °Drink plenty of fluids (prune juice may be helpful) and high fiber foods °Colace 100 mg by mouth twice a day  °Senokot for constipation as directed and as needed Dulcolax (bisacodyl), take with full glass of water  °Miralax (polyethylene glycol) once or twice a day as needed. ° °If you have tried all these things and are unable to have a bowel  movement in the first 3-4 days after surgery call either your surgeon or your primary doctor.   ° °If you experience loose stools or diarrhea, hold the medications until you stool forms back up.  If your symptoms do not get better within 1 week or if they get worse, check with your doctor.  If you experience "the worst abdominal pain ever" or develop nausea or vomiting, please contact the office immediately for further recommendations for treatment. ° ° °ITCHING:  If you experience itching with your medications, try taking only a single pain pill, or even half a pain pill at a time.  You can also use Benadryl over the counter for itching or also to help with sleep.  ° °TED HOSE STOCKINGS:  Use stockings on both legs until for at least 2 weeks or as   directed by physician office. They may be removed at night for sleeping.  MEDICATIONS:  See your medication summary on the After Visit Summary that nursing will review with you.  You may have some home medications which will be placed on hold until you complete the course of blood thinner medication.  It is important for you to complete the blood thinner medication as prescribed.  PRECAUTIONS:  If you experience chest pain or shortness of breath - call 911 immediately for transfer to the hospital emergency department.   If you develop a fever greater that 101 F, purulent drainage from wound, increased redness or drainage from wound, foul odor from the wound/dressing, or calf pain - CONTACT YOUR SURGEON.                                                   FOLLOW-UP APPOINTMENTS:  If you do not already have a post-op appointment, please call the office for an appointment to be seen by your surgeon.  Guidelines for how soon to be seen are listed in your After Visit Summary, but are typically between 1-4 weeks after surgery.  OTHER INSTRUCTIONS:   Knee Replacement:  Do not place pillow under knee, focus on keeping the knee straight while resting. CPM  instructions: 0-90 degrees, 2 hours in the morning, 2 hours in the afternoon, and 2 hours in the evening. Place foam block, curve side up under heel at all times except when in CPM or when walking.  DO NOT modify, tear, cut, or change the foam block in any way.  MAKE SURE YOU:   Understand these instructions.   Get help right away if you are not doing well or get worse.    Thank you for letting us be a part of your medical care team.  It is a privilege we respect greatly.  We hope these instructions will help you stay on track for a fast and full recovery!    Information on my medicine - ELIQUIS (apixaban)  This medication education was reviewed with me or my healthcare representative as part of my discharge preparation.  The pharmacist that spoke with me during my hospital stay was:  [facility] Maryanna ShapeJennifer Danielle, [facility]  Why was Eliquis prescribed for you? Eliquis was prescribed for you to reduce the risk of blood clots forming after orthopedic surgery.    What do You need to know about Eliquis? Take your Eliquis TWICE DAILY - one tablet in the morning and one tablet in the evening with or without food.  It would be best to take the dose about the same time each day.  If you have difficulty swallowing the tablet whole please discuss with your pharmacist how to take the medication safely.  Take Eliquis exactly as prescribed by your doctor and DO NOT stop taking Eliquis without talking to the doctor who prescribed the medication.  Stopping without other medication to take the place of Eliquis may increase your risk of developing a clot.  After discharge, you should have regular check-up appointments with your healthcare provider that is prescribing your Eliquis.  What do you do if you miss a dose? If a dose of ELIQUIS is not taken at the scheduled time, take it as soon as possible on the same day and twice-daily administration should be resumed.  The dose should not be  be doubled to make  up for a missed dose.  Do not take more than one tablet of ELIQUIS at the same time. ° °Important Safety Information °A possible side effect of Eliquis® is bleeding. You should call your healthcare provider right away if you experience any of the following: °? Bleeding from an injury or your nose that does not stop. °? Unusual colored urine (red or dark brown) or unusual colored stools (red or black). °? Unusual bruising for unknown reasons. °? A serious fall or if you hit your head (even if there is no bleeding). ° °Some medicines may interact with Eliquis® and might increase your risk of bleeding or clotting while on Eliquis®. To help avoid this, consult your healthcare provider or pharmacist prior to using any new prescription or non-prescription medications, including herbals, vitamins, non-steroidal anti-inflammatory drugs (NSAIDs) and supplements. ° °This website has more information on Eliquis® (apixaban): http://[email or web address].com/eliquis/home ° ° ° °

## 2017-05-15 NOTE — Progress Notes (Signed)
Physical Therapy Treatment Patient Details Name: Lauren Boyle MRN: [id number] DOB: 1965-11-07 Today's Date: 05/15/2017    History of Present Illness Pt is a 51 y.o. female s/p elective L TKA on 05/13/17. Pertinent PMH includes OA, depression.     PT Comments    Pt demonstrates improved tolerance for increased gait distance this session. Improved heel strike noted with increased knee flexion during stance phase. Pt instructed on continuing to keep a towel roll under left heel in long sitting to improve knee extension. Pt is Mod I for bed mobility and supervision for transfers this session. Pt will require continued therapy follow-up to work on stair negotiation and reviewed exercises in HEP.     Follow Up Recommendations  DC plan and follow up therapy as arranged by surgeon;Home health PT     Equipment Recommendations  None recommended by PT    Recommendations for Other Services       Precautions / Restrictions Precautions Precautions: Knee Precaution Booklet Issued: Yes (comment) Precaution Comments: handout given and reviewed now pillow under knee Restrictions Weight Bearing Restrictions: Yes LLE Weight Bearing: Weight bearing as tolerated    Mobility  Bed Mobility Overal bed mobility: Modified Independent Bed Mobility: Supine to Sit;Sit to Supine     Supine to sit: Modified independent (Device/Increase time) Sit to supine: Modified independent (Device/Increase time)   General bed mobility comments: Pt able to use RLE under LLE to bring LLE EOB   Transfers Overall transfer level: Needs assistance Equipment used: Rolling walker (2 wheeled) Transfers: Sit to/from Stand Sit to Stand: Supervision         General transfer comment: supervision for safety from EOB  Ambulation/Gait Ambulation/Gait assistance: Supervision Ambulation Distance (Feet): 250 Feet Assistive device: Rolling walker (2 wheeled) Gait Pattern/deviations: Step-through pattern;Decreased step  length - right;Decreased stance time - left;Antalgic Gait velocity: Decreased Gait velocity interpretation: Below normal speed for age/gender General Gait Details: Moderate antalgic gait with increased knee flexion during stance on LLE. Pt instructed on heel to toe pattern on LLE.    Stairs            Wheelchair Mobility    Modified Rankin (Stroke Patients Only)       Balance Overall balance assessment: Needs assistance Sitting-balance support: No upper extremity supported;Feet supported;Feet unsupported Sitting balance-Leahy Scale: Good     Standing balance support: Bilateral upper extremity supported;No upper extremity supported Standing balance-Leahy Scale: Fair Standing balance comment: able to stand briefly without UE support in standing                            Cognition Arousal/Alertness: Awake/alert Behavior During Therapy: WFL for tasks assessed/performed Overall Cognitive Status: Within Functional Limits for tasks assessed                                        Exercises Total Joint Exercises Ankle Circles/Pumps: AROM;Both;10 reps;Seated Quad Sets: AROM;Both;10 reps;Seated Heel Slides: AAROM;Left;10 reps Hip ABduction/ADduction: AROM;Left;10 reps    General Comments        Pertinent Vitals/Pain Pain Assessment: 0-10 Pain Score: 4  Faces Pain Scale: Hurts a little bit Pain Location: L anterior thigh Pain Descriptors / Indicators: Aching;Grimacing;Guarding Pain Intervention(s): Monitored during session;Premedicated before session;Repositioned;Ice applied    Home Living  Prior Function            PT Goals (current goals can now be found in the care plan section) Acute Rehab PT Goals Patient Stated Goal: Decreased pain Progress towards PT goals: Progressing toward goals    Frequency    7X/week      PT Plan Current plan remains appropriate    Co-evaluation               AM-PAC PT "6 Clicks" Daily Activity  Outcome Measure  Difficulty turning over in bed (including adjusting bedclothes, sheets and blankets)?: None Difficulty moving from lying on back to sitting on the side of the bed? : None Difficulty sitting down on and standing up from a chair with arms (e.g., wheelchair, bedside commode, etc,.)?: A Little Help needed moving to and from a bed to chair (including a wheelchair)?: A Little Help needed walking in hospital room?: A Lot Help needed climbing 3-5 steps with a railing? : A Little 6 Click Score: 19    End of Session Equipment Utilized During Treatment: Gait belt Activity Tolerance: Patient tolerated treatment well Patient left: in chair;with call bell/phone within reach Nurse Communication: Mobility status PT Visit Diagnosis: Other abnormalities of gait and mobility (R26.89);Pain Pain - Right/Left: Left Pain - part of body: Knee     Time: [id number] PT Time Calculation (min) (ACUTE ONLY): 33 min  Charges:  $Gait Training: 8-22 mins $Therapeutic Exercise: 8-22 mins                    G Codes:       Colin BroachSabra M. Denisa Enterline PT, DPT      Brack Shaddock Aletha HalimMarie Matea Stanard 05/15/2017, 11:20 AM

## 2017-05-16 ENCOUNTER — Encounter (HOSPITAL_COMMUNITY): Payer: Self-pay | Admitting: Orthopedic Surgery

## 2017-05-16 LAB — URINALYSIS, ROUTINE W REFLEX MICROSCOPIC
Bilirubin Urine: NEGATIVE
Glucose, UA: NEGATIVE mg/dL
Ketones, ur: NEGATIVE mg/dL
LEUKOCYTES UA: NEGATIVE
Nitrite: NEGATIVE
PH: 6 (ref 5.0–8.0)
Protein, ur: NEGATIVE mg/dL
SPECIFIC GRAVITY, URINE: 1.012 (ref 1.005–1.030)

## 2017-05-16 LAB — CBC
HEMATOCRIT: 31.2 % — AB (ref 36.0–46.0)
Hemoglobin: 10.1 g/dL — ABNORMAL LOW (ref 12.0–15.0)
MCH: 27.9 pg (ref 26.0–34.0)
MCHC: 32.4 g/dL (ref 30.0–36.0)
MCV: 86.2 fL (ref 78.0–100.0)
PLATELETS: 323 10*3/uL (ref 150–400)
RBC: 3.62 MIL/uL — ABNORMAL LOW (ref 3.87–5.11)
RDW: 14.2 % (ref 11.5–15.5)
WBC: 11.3 10*3/uL — AB (ref 4.0–10.5)

## 2017-05-16 MED ORDER — INFLUENZA VAC SPLIT QUAD 0.5 ML IM SUSY
0.5000 mL | PREFILLED_SYRINGE | Freq: Once | INTRAMUSCULAR | Status: AC
  Administered 2017-05-16: 0.5 mL via INTRAMUSCULAR
  Filled 2017-05-16: qty 0.5

## 2017-05-16 MED ORDER — INFLUENZA VAC SPLIT QUAD 0.5 ML IM SUSY
0.5000 mL | PREFILLED_SYRINGE | INTRAMUSCULAR | Status: DC
Start: 2017-05-17 — End: 2017-05-16

## 2017-05-16 NOTE — Progress Notes (Signed)
Subjective: 3 Days Post-Op Procedure(s) (LRB): LEFT TOTAL KNEE ARTHROPLASTY (Left) Patient reports pain as mild and moderate.  C/O no BM, +flatus.  Objective: Vital signs in last 24 hours: Temp:  [98.6 F (37 C)-99.4 F (37.4 C)] 98.6 F (37 C) (10/22 0500) Pulse Rate:  [79-93] 93 (10/22 0500) Resp:  [15] 15 (10/22 0500) BP: (99-120)/(53-89) 112/89 (10/22 0500) SpO2:  [94 %-100 %] 94 % (10/22 0500)  Intake/Output from previous day: 10/21 0701 - 10/22 0700 In: 240 [P.O.:240] Out: -  Intake/Output this shift: No intake/output data recorded.   Recent Labs  05/14/17 0632 05/15/17 0535 05/16/17 0606  HGB 10.7* 10.4* 10.1*    Recent Labs  05/15/17 0535 05/16/17 0606  WBC 13.1* 11.3*  RBC 3.81* 3.62*  HCT 32.6* 31.2*  PLT 336 323    Recent Labs  05/14/17 0632  NA 134*  K 3.7  CL 102  CO2 24  BUN 7  CREATININE 0.73  GLUCOSE 130*  CALCIUM 8.9   No results for input(s): LABPT, INR in the last 72 hours.  Neurovascular intact Sensation intact distally Intact pulses distally Dorsiflexion/Plantar flexion intact Incision: dressing C/D/I No cellulitis present  Assessment/Plan: 3 Days Post-Op Procedure(s) (LRB): LEFT TOTAL KNEE ARTHROPLASTY (Left) Up with therapy WBAT LLE Will recheck a UA, pt not symptomatic but did test positive preop for e coli and was treated with cipro which shows resistance.  Limited with ABX choices due to resistance and allergies, if positive would give 1g Ceftriaxone IV prior to d/c home today if showing many bacteria still.   Pain control as ordered Bowel help as ordered D/c home later today with HHPT eliquis dvt proph   Adilene Areola 05/16/2017, 8:52 AM

## 2017-05-16 NOTE — Progress Notes (Signed)
Orthopedic Tech Progress Note Patient Details:  Lauren SinkStephanie Boyle 04/27/1966 [id number]  Patient ID: Lauren SinkStephanie Boyle, female   DOB: 06/13/1966, 51 y.o.   MRN: [id number]   Nikki DomCrawford, Lauren Boyle 05/16/2017, 1:03 PM Placed pt's lle on cpm @ 1300; RN notified; 0-60 degrees

## 2017-05-16 NOTE — Progress Notes (Signed)
Physical Therapy Treatment Patient Details Name: Lauren Boyle MRN: [id number] DOB: 03/29/1966 Today's Date: 05/16/2017    History of Present Illness Pt is a 51 y.o. female s/p elective L TKA on 05/13/17. Pertinent PMH includes OA, depression.     PT Comments    Pt performed increased gait and reviewed stair training in prep for d/c home.  Plan next session for continued gait training and strengthening.  Pt tolerated session well and is ready to d/c from a mobility session.     Follow Up Recommendations  DC plan and follow up therapy as arranged by surgeon;Home health PT     Equipment Recommendations  None recommended by PT    Recommendations for Other Services       Precautions / Restrictions Precautions Precautions: Knee Precaution Booklet Issued: Yes (comment) Precaution Comments: Reviewed resting in extension Required Braces or Orthoses: Knee Immobilizer - Left Knee Immobilizer - Left: On when out of bed or walking;Other (comment) Restrictions Weight Bearing Restrictions: Yes LLE Weight Bearing: Weight bearing as tolerated    Mobility  Bed Mobility Overal bed mobility: Modified Independent       Supine to sit: Modified independent (Device/Increase time)     General bed mobility comments: Pt able to use RLE under LLE to bring LLE EOB   Transfers Overall transfer level: Needs assistance Equipment used: Rolling walker (2 wheeled) Transfers: Sit to/from Stand Sit to Stand: Supervision         General transfer comment: Cues for hand placement pt pulls on RW into standing.  Pt able to correct when returning to seated surface.    Ambulation/Gait Ambulation/Gait assistance: Supervision Ambulation Distance (Feet): 350 Feet Assistive device: Rolling walker (2 wheeled) Gait Pattern/deviations: Step-through pattern;Decreased stride length;Trunk flexed;Antalgic Gait velocity: Decreased Gait velocity interpretation: Below normal speed for age/gender General Gait  Details: cues for heel strike for improved knee extension during gait, cues for gait symmetry and decreasing use of UEs on RW.     Stairs Stairs: Yes   Stair Management: No rails;Step to pattern;Forwards Number of Stairs: 4 General stair comments: Cues for sequencing and hand placement on railing.  Pt performed improper sequencing x1 but able to correct without cueing.    Wheelchair Mobility    Modified Rankin (Stroke Patients Only)       Balance Overall balance assessment: Needs assistance   Sitting balance-Leahy Scale: Good       Standing balance-Leahy Scale: Fair Standing balance comment: standing for LE therex no UE support                            Cognition Arousal/Alertness: Awake/alert Behavior During Therapy: WFL for tasks assessed/performed Overall Cognitive Status: Within Functional Limits for tasks assessed                                        Exercises Total Joint Exercises Ankle Circles/Pumps: AROM;Both;10 reps;Supine Quad Sets: AROM;Both;10 reps;Supine Towel Squeeze: AROM;Both;10 reps;Supine Short Arc Quad: AAROM;Supine;10 reps Heel Slides: Left;10 reps;Supine;AAROM Hip ABduction/ADduction: Left;10 reps;Supine;AAROM Straight Leg Raises: Left;10 reps;AAROM Goniometric ROM: grossly 60 degrees flexion.      General Comments        Pertinent Vitals/Pain Pain Assessment: 0-10 Pain Score: 6  Pain Location: L knee Pain Descriptors / Indicators: Aching;Grimacing;Guarding Pain Intervention(s): Monitored during session;Repositioned;Ice applied    Home Living  Prior Function            PT Goals (current goals can now be found in the care plan section) Acute Rehab PT Goals Patient Stated Goal: Decreased pain Potential to Achieve Goals: Good Progress towards PT goals: Progressing toward goals    Frequency    7X/week      PT Plan Current plan remains appropriate     Co-evaluation              AM-PAC PT "6 Clicks" Daily Activity  Outcome Measure  Difficulty turning over in bed (including adjusting bedclothes, sheets and blankets)?: None Difficulty moving from lying on back to sitting on the side of the bed? : None Difficulty sitting down on and standing up from a chair with arms (e.g., wheelchair, bedside commode, etc,.)?: A Little Help needed moving to and from a bed to chair (including a wheelchair)?: A Little Help needed walking in hospital room?: A Little Help needed climbing 3-5 steps with a railing? : A Little 6 Click Score: 20    End of Session Equipment Utilized During Treatment: Gait belt Activity Tolerance: Patient tolerated treatment well Patient left: with call bell/phone within reach;in bed Nurse Communication: Mobility status PT Visit Diagnosis: Other abnormalities of gait and mobility (R26.89);Pain Pain - Right/Left: Left Pain - part of body: Knee     Time: 1011-1036 PT Time Calculation (min) (ACUTE ONLY): 25 min  Charges:  $Gait Training: 8-22 mins $Therapeutic Exercise: 8-22 mins                    G Codes:       [name], PTA pager [phone]    [name] 05/16/2017, 10:46 AM

## 2017-05-16 NOTE — Progress Notes (Signed)
Patient requests Linzess for constipation.

## 2017-05-16 NOTE — Discharge Summary (Signed)
PATIENT ID: Lauren Boyle        MRN:  [id number]          DOB/AGE: 02/13/1966 / 51 y.o.    DISCHARGE SUMMARY  ADMISSION DATE:    05/13/2017 DISCHARGE DATE:   05/16/2017   ADMISSION DIAGNOSIS: OA LEFT KNEE    DISCHARGE DIAGNOSIS:  OA LEFT KNEE    ADDITIONAL DIAGNOSIS: Active Problems:   Primary localized osteoarthritis of left knee  Past Medical History:  Diagnosis Date  . Arthritis   . Chicken pox   . Depression   . History of meniscal tear    left  . Iron deficiency   . Osteoarthritis of knee    left    PROCEDURE: Procedure(s): LEFT TOTAL KNEE ARTHROPLASTY Left on 05/13/2017  CONSULTS: PT/OT    HISTORY:  See H&P in chart  HOSPITAL COURSE:  Lauren Boyle is a 51 y.o. admitted on 05/13/2017 and found to have a diagnosis of OA LEFT KNEE.  After appropriate laboratory studies were obtained  they were taken to the operating room on 05/13/2017 and underwent  Procedure(s): LEFT TOTAL KNEE ARTHROPLASTY  Left.   They were given perioperative antibiotics:  Anti-infectives    Start     Dose/Rate Route Frequency Ordered Stop   05/13/17 2100  vancomycin (VANCOCIN) IVPB 1000 mg/200 mL premix     1,000 mg 200 mL/hr over 60 Minutes Intravenous Every 12 hours 05/13/17 1501 05/13/17 2210   05/13/17 0850  vancomycin (VANCOCIN) IVPB 1000 mg/200 mL premix     1,000 mg 200 mL/hr over 60 Minutes Intravenous On call to O.R. 05/13/17 0850 05/13/17 1126    .  Tolerated the procedure well.    POD #1, allowed out of bed to a chair.  PT for ambulation and exercise program.   IV saline locked.  O2 discontionued.  POD #2, continued PT and ambulation.   The remainder of the hospital course was dedicated to ambulation and strengthening.   The patient was discharged on 3 Days Post-Op in  Stable condition.  Blood products given:none  DIAGNOSTIC STUDIES: Recent vital signs: Patient Vitals for the past 24 hrs:  BP Temp Temp src Pulse Resp SpO2  05/16/17 0500 112/89 98.6 F (37  C) Oral 93 15 94 %  05/15/17 2006 120/65 99.4 F (37.4 C) Oral 81 15 100 %  05/15/17 1900 111/63 - - - - -  05/15/17 1455 (!) 99/53 98.7 F (37.1 C) Oral 79 15 98 %       Recent laboratory studies:  Recent Labs  05/14/17 0632 05/15/17 0535 05/16/17 0606  WBC 12.0* 13.1* 11.3*  HGB 10.7* 10.4* 10.1*  HCT 33.3* 32.6* 31.2*  PLT 357 336 323    Recent Labs  05/14/17 0632  NA 134*  K 3.7  CL 102  CO2 24  BUN 7  CREATININE 0.73  GLUCOSE 130*  CALCIUM 8.9   Lab Results  Component Value Date   INR 0.96 05/02/2017   INR 1.02 04/14/2015     Recent Radiographic Studies :  Dg Chest 2 View  Result Date: 05/02/2017 CLINICAL DATA:  Preoperative respiratory evaluation for knee replacement. EXAM: CHEST  2 VIEW COMPARISON:  07/23/2016 FINDINGS: The lungs are clear without focal pneumonia, edema, pneumothorax or pleural effusion. The cardiopericardial silhouette is within normal limits for size. The visualized bony structures of the thorax are intact. IMPRESSION: Normal exam. Electronically Signed   By: Kennith Center M.D.   On: 05/02/2017 15:08  DISCHARGE INSTRUCTIONS:   DISCHARGE MEDICATIONS:   Allergies as of 05/16/2017      Reactions   Penicillins Anaphylaxis, Itching, Other (See Comments)   Has patient had a PCN reaction causing immediate rash, facial/tongue/throat swelling, SOB or lightheadedness with hypotension: Yes. Has patient had a PCN reaction causing severe rash involving mucus membranes or skin necrosis: Yes Has patient had a PCN reaction that required hospitalization- No.- had to be given IV medication. Has patient had a PCN reaction occurring within the last 10 years: No. If all of the above answers are "NO", then may proceed with Cephalosporin use.      Medication List    STOP taking these medications   polyethylene glycol powder powder Commonly known as:  GLYCOLAX/MIRALAX     TAKE these medications   apixaban 2.5 MG Tabs tablet Commonly known as:   ELIQUIS Take 1 tablet (2.5 mg total) by mouth 2 (two) times daily.   omeprazole 20 MG capsule Commonly known as:  PRILOSEC Take 1 capsule (20 mg total) by mouth daily.   oxyCODONE-acetaminophen 5-325 MG tablet Commonly known as:  ROXICET Take 1-2 tablets by mouth every 4 (four) hours as needed for severe pain.            Durable Medical Equipment        Start     Ordered   05/13/17 1502  DME Walker rolling  Once    Question:  Patient needs a walker to treat with the following condition  Answer:  Primary localized osteoarthritis of left knee   05/13/17 1501   05/13/17 1502  DME 3 n 1  Once     05/13/17 1501      FOLLOW UP VISIT:   Follow-up Information    Frederico Hammanaffrey, Daniel, MD. Schedule an appointment as soon as possible for a visit in 2 week(s).   Specialty:  Orthopedic Surgery Contact information: [address] [place] [address] [phone]        [facility] Follow up.   Specialty:  Home Health Services Why:  A representative from [facility] will call you to arrange start date and time for your therapy. Should you not receive call within 24-48hrs please call [phone].  Contact information: [address] [place] [place] [address] [phone]           DISPOSITION:   Home  CONDITION:  Stable   Margart SicklesJoshua Chasitty Hehl, PA-C  05/16/2017 8:41 AM

## 2017-05-16 NOTE — Progress Notes (Signed)
Called and notified Nelda SevereJosh Chandler -PA about the results of Urinalysis. He said patient is ok to discharge now. No need for antibiotics.

## 2017-05-16 NOTE — Progress Notes (Signed)
Physical Therapy Treatment Patient Details Name: Lauren SinkStephanie Boyle MRN: [id number] DOB: 10/01/1965 Today's Date: 05/16/2017    History of Present Illness Pt is a 51 y.o. female s/p elective L TKA on 05/13/17. Pertinent PMH includes OA, depression.     PT Comments    Pt performed gait and reviewed exercises in prep for d/c home.  Pt to d/c home with support from her husband.  Pt educated on frequency of exercises.  Pt impulsive and easily distracted during tx this afternoon but no LOB noted and good safety observed.     Follow Up Recommendations  DC plan and follow up therapy as arranged by surgeon;Home health PT     Equipment Recommendations  None recommended by PT    Recommendations for Other Services       Precautions / Restrictions Precautions Precautions: Knee Precaution Booklet Issued: Yes (comment) Restrictions Weight Bearing Restrictions: Yes LLE Weight Bearing: Weight bearing as tolerated    Mobility  Bed Mobility Overal bed mobility: Modified Independent       Supine to sit: Modified independent (Device/Increase time) Sit to supine: Modified independent (Device/Increase time)   General bed mobility comments: Pt able to use RLE under LLE to bring LLE EOB   Transfers Overall transfer level: Needs assistance Equipment used: Rolling walker (2 wheeled) Transfers: Sit to/from Stand Sit to Stand: Supervision         General transfer comment: Cues for hand placement pt pulls on RW into standing.  Pt able to correct when returning to seated surface.  Pt performed additonal car transfer and required cues for hand placement onto dashboard.    Ambulation/Gait Ambulation/Gait assistance: Supervision Ambulation Distance (Feet): 270 Feet Assistive device: Rolling walker (2 wheeled) Gait Pattern/deviations: Step-through pattern;Decreased stride length;Trunk flexed;Antalgic Gait velocity: Decreased Gait velocity interpretation: Below normal speed for  age/gender General Gait Details: Cues for continuity of gait pattern.  Pt frequently stopping and distracted during gait.     Stairs            Wheelchair Mobility    Modified Rankin (Stroke Patients Only)       Balance     Sitting balance-Leahy Scale: Normal       Standing balance-Leahy Scale: Fair                              Cognition Arousal/Alertness: Awake/alert Behavior During Therapy: WFL for tasks assessed/performed Overall Cognitive Status: Within Functional Limits for tasks assessed                                 General Comments: Pt slightly impulsive      Exercises Total Joint Exercises Ankle Circles/Pumps: AROM;Both;10 reps;Supine Quad Sets: AROM;Both;10 reps;Supine Short Arc Quad: AAROM;Supine;10 reps Heel Slides: Left;10 reps;Supine;AAROM Hip ABduction/ADduction: Left;10 reps;Supine;AAROM Straight Leg Raises: Left;10 reps;AAROM Long Arc Quad: Left;10 reps;Supine;AAROM    General Comments        Pertinent Vitals/Pain Pain Assessment: 0-10 Pain Score: 7  (post gait training.) Pain Location: L knee Pain Descriptors / Indicators: Aching;Grimacing;Guarding Pain Intervention(s): Monitored during session;Repositioned    Home Living                      Prior Function            PT Goals (current goals can now be found in the care plan section) Acute Rehab PT  Goals Patient Stated Goal: Decreased pain Potential to Achieve Goals: Good Progress towards PT goals: Progressing toward goals    Frequency    7X/week      PT Plan Current plan remains appropriate    Co-evaluation              AM-PAC PT "6 Clicks" Daily Activity  Outcome Measure  Difficulty turning over in bed (including adjusting bedclothes, sheets and blankets)?: None Difficulty moving from lying on back to sitting on the side of the bed? : None Difficulty sitting down on and standing up from a chair with arms (e.g.,  wheelchair, bedside commode, etc,.)?: None Help needed moving to and from a bed to chair (including a wheelchair)?: None Help needed walking in hospital room?: None Help needed climbing 3-5 steps with a railing? : None 6 Click Score: 24    End of Session Equipment Utilized During Treatment: Gait belt Activity Tolerance: Patient tolerated treatment well (distracted easily.  ) Patient left: with call bell/phone within reach;in bed Nurse Communication: Mobility status PT Visit Diagnosis: Other abnormalities of gait and mobility (R26.89);Pain Pain - Right/Left: Left Pain - part of body: Knee     Time: 1610-9604 PT Time Calculation (min) (ACUTE ONLY): 41 min  Charges:  $Gait Training: 23-37 mins $Therapeutic Exercise: 8-22 mins                    G Codes:       [name], PTA pager [phone]    Lauren Boyle 05/16/2017, 3:50 PM

## 2017-05-20 NOTE — Op Note (Signed)
NAMCari Caraway:  Dougan, Tamora            ACCOUNT NO.:  [id number]  MEDICAL RECORD NO.:  [id number]  LOCATION:  [id number]                        FACILITY:  [facility]  PHYSICIAN:  Dyke BrackettW. D. Leonel Mccollum, M.D.    DATE OF BIRTH:  Oct 29, 1965  DATE OF PROCEDURE:  05/20/2017 DATE OF DISCHARGE:                              OPERATIVE REPORT   PREOPERATIVE DIAGNOSIS:  Osteoarthritis, left knee.  POSTOPERATIVE DIAGNOSIS:  Osteoarthritis, left knee.  OPERATION:  Left total knee replacement (Sigma cemented knee size femur 3, tibia 3, poly 12.5 mm thickness with patella 35 mm all poly.  SURGEON:  Dyke BrackettW. D. Pete Schnitzer, M.D.  ASSISTANVincent Peyer:  Chadwell.  TOURNIQUET TIME:  Approximately 54 minutes.  I believe it was a spinal anesthetic, not sure about that.  DESCRIPTION OF PROCEDURE:  Standard positioning, supine position, inflation to a thigh tourniquet to 350.  Straight skin incision was made with medial parapatellar.  Parapatellar approach to the knee made.  We cut a 5-degree 11 mm valgus cut on the femur, followed by cutting about 3 mm below the most diseased compartment on the tibia.  Extension gap measured at 12.5 mm.  Excess menisci were removed from the posterior aspect of the knee.  We used the sizing guide to size the femur to be a size 3, placing the all-in-1 cutting block in appropriate degree of external rotation, followed by the anterior, posterior, and chamfer cuts.  PCL was released.  Posterior capsule was stripped.  Kevin FentonKeyhole was cut for the tibia, followed by placement of the tibial base plate trial and then the box for the femur. Patella was cut leaving about 16-17 mm of native patella and all poly trial placed.  All the trials were placed, resolution of the deformity was noted with good balancing of the ligaments, good stability.  We then inserted the final components after pulsatile lavage.  Cement was inserted in the doughy state, tibia followed by femur, patella.  We used a trial bearing while the cement  hardened.  Once the cement had hardened, we checked the knee and removed small bits of cement from the posterior aspect of the knee.  Tourniquet was released and no significant bleeding was noted.  We had infiltrated prior to tourniquet release in mixture of Exparel and Marcaine with epinephrine.  Closure was affected with #1 Ethibond, 0 and 2-0 Vicryl, and skin clips. Lightly compressive sterile dressing and knee immobilizer applied.     Dyke BrackettW. D. Islam Eichinger, M.D.     WDC/MEDQ  D:  05/20/2017  T:  05/20/2017  Job:  [id number]

## 2017-06-27 ENCOUNTER — Ambulatory Visit: Payer: 59 | Admitting: Psychology

## 2018-01-12 ENCOUNTER — Encounter (HOSPITAL_COMMUNITY): Payer: Self-pay | Admitting: Emergency Medicine

## 2018-01-12 ENCOUNTER — Emergency Department (HOSPITAL_COMMUNITY): Payer: Managed Care, Other (non HMO)

## 2018-01-12 ENCOUNTER — Emergency Department (HOSPITAL_COMMUNITY)
Admission: EM | Admit: 2018-01-12 | Discharge: 2018-01-12 | Disposition: A | Discharge status: Home / Self Care | Payer: Managed Care, Other (non HMO) | Attending: Emergency Medicine | Admitting: Emergency Medicine

## 2018-01-12 ENCOUNTER — Other Ambulatory Visit: Payer: Self-pay

## 2018-01-12 DIAGNOSIS — Z79899 Other long term (current) drug therapy: Secondary | ICD-10-CM | POA: Diagnosis not present

## 2018-01-12 DIAGNOSIS — N390 Urinary tract infection, site not specified: Secondary | ICD-10-CM | POA: Diagnosis not present

## 2018-01-12 DIAGNOSIS — M549 Dorsalgia, unspecified: Secondary | ICD-10-CM | POA: Diagnosis present

## 2018-01-12 DIAGNOSIS — F17210 Nicotine dependence, cigarettes, uncomplicated: Secondary | ICD-10-CM | POA: Insufficient documentation

## 2018-01-12 LAB — URINALYSIS, ROUTINE W REFLEX MICROSCOPIC
Bilirubin Urine: NEGATIVE
Glucose, UA: NEGATIVE mg/dL
Ketones, ur: NEGATIVE mg/dL
Leukocytes, UA: NEGATIVE
Nitrite: POSITIVE — AB
Protein, ur: NEGATIVE mg/dL
SPECIFIC GRAVITY, URINE: 1.023 (ref 1.005–1.030)
pH: 5 (ref 5.0–8.0)

## 2018-01-12 MED ORDER — TRAMADOL HCL 50 MG PO TABS: 50.0000 mg | ORAL_TABLET | Freq: Four times a day (QID) | ORAL | 0 refills | Status: DC | PRN

## 2018-01-12 MED ORDER — CIPROFLOXACIN IN D5W 400 MG/200ML IV SOLN: 400.0000 mg | Freq: Once | INTRAVENOUS | Status: DC

## 2018-01-12 MED ORDER — SULFAMETHOXAZOLE-TRIMETHOPRIM 800-160 MG PO TABS: 1.0000 | ORAL_TABLET | Freq: Two times a day (BID) | ORAL | 0 refills | Status: DC

## 2018-01-12 MED ORDER — IBUPROFEN 800 MG PO TABS
800.0000 mg | ORAL_TABLET | Freq: Once | ORAL | Status: AC
  Administered 2018-01-12: 800 mg via ORAL
  Filled 2018-01-12: qty 1

## 2018-01-12 NOTE — ED Provider Notes (Signed)
[facility] EMERGENCY DEPARTMENT Provider Note   CSN: [id number] Arrival date & time: 01/12/18  4098     History   Chief Complaint Chief Complaint  Patient presents with  . Back Pain    HPI Lauren Boyle is a 52 y.o. female.  HPI   52 year old female with history of arthritis, depression, presenting for evaluation of back pain.  Patient report gradual onset progressive worsening left mid back pain ongoing for about 2-1/2 weeks.  Pain is described as a throbbing achy sensation that is non-radiating, persistent, worsening with any kind of movement including cough.  She does endorse increasing cough with occasional phlegm.  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  There is no associated fever, chills, lightheadedness, dizziness, exertional chest pain, abdominal pain, dysuria, hematuria, or rash.  She denies any strenuous activities or heavy lifting.  She has been taking NSAIDs without adequate relief.  She rates the pain as 8 out of 10 currently.  Patient denies any prior history of PE or DVT, no recent surgery, prolonged bedrest, leg swelling or calf pain, active cancer, or hemoptysis.  She is not on any oral hormone.  She does not have any significant cardiac history.   Past Medical History:  Diagnosis Date  . Arthritis   . Chicken pox   . Depression   . History of meniscal tear    left  . Iron deficiency   . Osteoarthritis of knee    left    Patient Active Problem List   Diagnosis Date Noted  . Primary localized osteoarthritis of left knee 05/13/2017  . Adrenal mass, right (HCC) 05/27/2014  . Low back pain 07/04/2013    Past Surgical History:  Procedure Laterality Date  . ABDOMINAL HYSTERECTOMY     partial  . CHONDROPLASTY Left 09/17/2016   Procedure: CHONDROPLASTY;  Surgeon: Frederico Hamman, MD;  Location: [facility];  Service: Orthopedics;  Laterality: Left;  . COLONOSCOPY    . KNEE  ARTHROSCOPY WITH MEDIAL MENISECTOMY Left 09/17/2016   Procedure: KNEE ARTHROSCOPY WITH PARTIAL MEDIAL MENISECTOMY;  Surgeon: Frederico Hamman, MD;  Location: [facility];  Service: Orthopedics;  Laterality: Left;  . ORIF ULNAR FRACTURE Left 04/14/2015   Procedure: OPEN REDUCTION INTERNAL FIXATION (ORIF) ULNAR FRACTURE;  Surgeon: Eldred Manges, MD;  Location: [facility] OR;  Service: Orthopedics;  Laterality: Left;  . TOTAL KNEE ARTHROPLASTY Left 05/13/2017   Procedure: LEFT TOTAL KNEE ARTHROPLASTY;  Surgeon: Frederico Hamman, MD;  Location: [facility];  Service: Orthopedics;  Laterality: Left;     OB History   None      Home Medications    Prior to Admission medications   Medication Sig Start Date End Date Taking? Authorizing Provider  apixaban (ELIQUIS) 2.5 MG TABS tablet Take 1 tablet (2.5 mg total) by mouth 2 (two) times daily. 05/13/17   Chadwell, Ivin Booty, PA-C  omeprazole (PRILOSEC) 20 MG capsule Take 1 capsule (20 mg total) by mouth daily. 04/11/17   Deeann Saint, MD  oxyCODONE-acetaminophen (ROXICET) 5-325 MG tablet Take 1-2 tablets by mouth every 4 (four) hours as needed for severe pain. 05/13/17   Chadwell, Ivin Booty, PA-C    Family History Family History  Problem Relation Age of Onset  . Hyperlipidemia Mother   . Hypertension Mother   . Hypertension Father   . Hyperlipidemia Father   . Diabetes Father   . Cancer Sister        ovarian  .  Heart attack Neg Hx   . Sudden death Neg Hx     Social History Social History   Tobacco Use  . Smoking status: Current Some Day Smoker    Packs/day: 0.25    Types: Cigarettes  . Smokeless tobacco: Never Used  Substance Use Topics  . Alcohol use: Yes    Alcohol/week: 4.2 oz    Types: 7 Glasses of wine per week    Comment: social  . Drug use: No     Allergies   Penicillins   Review of Systems Review of Systems  All other systems reviewed and are negative.    Physical Exam Updated Vital Signs BP 136/78 (BP Location:  Right Arm)   Pulse (!) 58   Temp 97.8 F (36.6 C) (Oral)   Resp 16   SpO2 100%   Physical Exam  Constitutional: She is oriented to person, place, and time. She appears well-developed and well-nourished. No distress.  Obese female resting in bed in no acute discomfort.  HENT:  Head: Atraumatic.  Eyes: Conjunctivae are normal.  Neck: Neck supple.  Cardiovascular: Normal rate and regular rhythm.  Pulmonary/Chest: Effort normal and breath sounds normal.  Abdominal: Soft. Bowel sounds are normal. She exhibits no distension. There is no tenderness.  Musculoskeletal: She exhibits tenderness (Tenderness to left mid posterior back on palpation without any overlying skin changes, no significant pain to midline spine.  No crepitus or emphysema.).  Neurological: She is alert and oriented to person, place, and time.  Skin: No rash noted.  Psychiatric: She has a normal mood and affect.  Nursing note and vitals reviewed.    ED Treatments / Results  Labs (all labs ordered are listed, but only abnormal results are displayed) Labs Reviewed  URINALYSIS, ROUTINE W REFLEX MICROSCOPIC - Abnormal; Notable for the following components:      Result Value   Hgb urine dipstick LARGE (*)    Nitrite POSITIVE (*)    Bacteria, UA MANY (*)    All other components within normal limits    EKG None  Radiology No results found.  Procedures Procedures (including critical care time)  Medications Ordered in ED Medications  ibuprofen (ADVIL,MOTRIN) tablet 800 mg (800 mg Oral Given 01/12/18 0837)     Initial Impression / Assessment and Plan / ED Course  I have reviewed the triage vital signs and the nursing notes.  Pertinent labs & imaging results that were available during my care of the patient were reviewed by me and considered in my medical decision making (see chart for details).     BP (!) 154/88   Pulse (!) 58   Temp 97.8 F (36.6 C) (Oral)   Resp 16   SpO2 100%    Final Clinical  Impressions(s) / ED Diagnoses   Final diagnoses:  Lower urinary tract infectious disease    ED Discharge Orders        Ordered    sulfamethoxazole-trimethoprim (BACTRIM DS,SEPTRA DS) 800-160 MG tablet  2 times daily     01/12/18 1026    traMADol (ULTRAM) 50 MG tablet  Every 6 hours PRN     01/12/18 1026     8:04 AM Patient with reproducible left-sided mid back pain likely musculoskeletal in origin.  It was documented that patient has a right adrenal mass however her pain is in the left side and I do not think this is related to her adrenal mass.  I did get her through the prior charts and did not  find any specific information in regard to that.  Patient's vital signs stable low suspicion for adrenal crisis causing her symptoms.  Given the location of her symptoms, will obtain UA to rule out occult urinary tract infection or kidney infection.  10:09 AM X-ray is unremarkable however urinalysis with evidence suggestive of urinary tract infection.  Given the location of her pain, concern for pyelonephritis.  Plan to obtain renal function as well as give patient IV antibiotic however patient request to be discharged as she has to go home to take care of her pets.  Since she is not actively vomiting, vital signs stable, I am comfortable sending patient home with p.o. antibiotic.  Patient understand to return if her condition worsen despite being on antibiotic. In order to decrease risk of narcotic abuse. Pt's record were checked using the [place] Controlled Substance database.     Fayrene Helper, PA-C 01/12/18 1028    Melene Plan, DO 01/12/18 2259

## 2018-01-12 NOTE — ED Notes (Signed)
Patient verbalizes understanding of discharge instructions. Opportunity for questioning and answers were provided. Armband removed by staff, pt discharged from ED ambulatory.   

## 2018-01-12 NOTE — ED Triage Notes (Signed)
Patient to ED c/o L sided back pain x 2 weeks, denies known injury. States movement, walking, coughing makes worse.

## 2018-01-12 NOTE — Discharge Instructions (Signed)
Please take antibiotic as prescribed for the full duration.  Take tramadol as needed for pain.  Return if you notice no improvement after 48 hrs.

## 2018-01-19 ENCOUNTER — Encounter: Payer: Self-pay | Admitting: Family Medicine

## 2018-01-19 ENCOUNTER — Ambulatory Visit (INDEPENDENT_AMBULATORY_CARE_PROVIDER_SITE_OTHER): Payer: Managed Care, Other (non HMO) | Admitting: Family Medicine

## 2018-01-19 ENCOUNTER — Inpatient Hospital Stay: Admission: RE | Admit: 2018-01-19 | Payer: Self-pay | Source: Ambulatory Visit

## 2018-01-19 VITALS — BP 118/78 | HR 78 | Temp 98.2°F | Wt 183.0 lb

## 2018-01-19 DIAGNOSIS — M546 Pain in thoracic spine: Secondary | ICD-10-CM

## 2018-01-19 DIAGNOSIS — R319 Hematuria, unspecified: Secondary | ICD-10-CM

## 2018-01-19 DIAGNOSIS — N390 Urinary tract infection, site not specified: Secondary | ICD-10-CM | POA: Diagnosis not present

## 2018-01-19 LAB — CBC WITH DIFFERENTIAL/PLATELET
BASOS ABS: 0.1 10*3/uL (ref 0.0–0.1)
Basophils Relative: 1.3 % (ref 0.0–3.0)
EOS ABS: 0.4 10*3/uL (ref 0.0–0.7)
Eosinophils Relative: 6 % — ABNORMAL HIGH (ref 0.0–5.0)
HEMATOCRIT: 42.6 % (ref 36.0–46.0)
Hemoglobin: 14 g/dL (ref 12.0–15.0)
LYMPHS ABS: 3.4 10*3/uL (ref 0.7–4.0)
LYMPHS PCT: 49.5 % — AB (ref 12.0–46.0)
MCHC: 32.9 g/dL (ref 30.0–36.0)
MCV: 86.2 fl (ref 78.0–100.0)
MONOS PCT: 5.3 % (ref 3.0–12.0)
Monocytes Absolute: 0.4 10*3/uL (ref 0.1–1.0)
NEUTROS PCT: 37.9 % — AB (ref 43.0–77.0)
Neutro Abs: 2.6 10*3/uL (ref 1.4–7.7)
PLATELETS: 478 10*3/uL — AB (ref 150.0–400.0)
RBC: 4.94 Mil/uL (ref 3.87–5.11)
RDW: 14.4 % (ref 11.5–15.5)
WBC: 6.8 10*3/uL (ref 4.0–10.5)

## 2018-01-19 LAB — BASIC METABOLIC PANEL
BUN: 13 mg/dL (ref 6–23)
CHLORIDE: 106 meq/L (ref 96–112)
CO2: 29 meq/L (ref 19–32)
Calcium: 10.3 mg/dL (ref 8.4–10.5)
Creatinine, Ser: 0.84 mg/dL (ref 0.40–1.20)
GFR: 90.93 mL/min (ref >60.00)
Glucose, Bld: 94 mg/dL (ref 70–99)
POTASSIUM: 5.1 meq/L (ref 3.5–5.1)
Sodium: 141 mEq/L (ref 135–145)

## 2018-01-19 LAB — POCT URINALYSIS DIPSTICK
Bilirubin, UA: NEGATIVE
Glucose, UA: NEGATIVE
Ketones, UA: NEGATIVE
Leukocytes, UA: NEGATIVE
NITRITE UA: NEGATIVE
PH UA: 6 (ref 5.0–8.0)
PROTEIN UA: NEGATIVE
Spec Grav, UA: 1.025 (ref 1.010–1.025)
Urobilinogen, UA: 0.2 E.U./dL

## 2018-01-19 MED ORDER — SULFAMETHOXAZOLE-TRIMETHOPRIM 800-160 MG PO TABS: 1.0000 | ORAL_TABLET | Freq: Two times a day (BID) | ORAL | 0 refills | Status: DC

## 2018-01-19 MED ORDER — TRAMADOL HCL 50 MG PO TABS: 50.0000 mg | ORAL_TABLET | Freq: Three times a day (TID) | ORAL | 0 refills | Status: AC | PRN

## 2018-01-19 NOTE — Progress Notes (Signed)
Subjective:    Patient ID: Lauren SinkStephanie Luz, female    DOB: 06/23/1966, 52 y.o.   MRN: [id number]  No chief complaint on file.   HPI Patient was seen today for acute concern.  Pt endorses back pain.  Pt was seen in ED on 01/12/18 for same, dx'd with UTI, given bactrim.  Pt endorses back pain like a pulling sensation and a continuous burning sensation in L mid back with edema.  Pt also notes increased sweating x 1-2 months.  Pt denies hematuria, frequency, dysuria, fever, chills, pain that moves.  Past Medical History:  Diagnosis Date  . Arthritis   . Chicken pox   . Depression   . History of meniscal tear    left  . Iron deficiency   . Osteoarthritis of knee    left    Allergies  Allergen Reactions  . Penicillins Anaphylaxis, Itching and Other (See Comments)    Has patient had a PCN reaction causing immediate rash, facial/tongue/throat swelling, SOB or lightheadedness with hypotension: Yes. Has patient had a PCN reaction causing severe rash involving mucus membranes or skin necrosis: Yes Has patient had a PCN reaction that required hospitalization- No.- had to be given IV medication. Has patient had a PCN reaction occurring within the last 10 years: No. If all of the above answers are "NO", then may proceed with Cephalosporin use.     ROS General: Denies fever, chills, changes in weight, changes in appetite  +increased sweating HEENT: Denies headaches, ear pain, changes in vision, rhinorrhea, sore throat CV: Denies CP, palpitations, SOB, orthopnea Pulm: Denies SOB, cough, wheezing GI: Denies abdominal pain, nausea, vomiting, diarrhea, constipation GU: Denies dysuria, hematuria, frequency, vaginal discharge Msk: Denies muscle cramps, joint pains  +back pain/burning Neuro: Denies weakness, numbness, tingling Skin: Denies rashes, bruising Psych: Denies depression, anxiety, hallucinations     Objective:    Blood pressure 118/78, pulse 78, temperature 98.2 F (36.8 C),  temperature source Oral, weight 183 lb (83 kg), SpO2 98 %.   Gen. Pleasant, well-nourished, in no distress, normal affect   HEENT: [place]/AT, face symmetric, no scleral icterus, PERRLA, nares patent without drainage Lungs: no accessory muscle use, CTAB, no wheezes or rales Cardiovascular: RRR, no m/r/g, no peripheral edema Abdomen: BS present, soft, ND, TTP in LLQ> RLQ and suprapubic area. Musculoskeletal: no TTP of spine, L of thoracic spine with TTP and soft tissue edema, skin taught compared to R side, CVA tenderness on L, no cyanosis or clubbing, normal tone Neuro:  A&Ox3, CN II-XII intact, normal gait- wearing heels. Skin:  Warm, no lesions/ rash.  Edema of mid back L of spine.   Wt Readings from Last 3 Encounters:  01/19/18 183 lb (83 kg)  05/13/17 192 lb (87.1 kg)  05/02/17 192 lb 14.4 oz (87.5 kg)    Lab Results  Component Value Date   WBC 11.3 (H) 05/16/2017   HGB 10.1 (L) 05/16/2017   HCT 31.2 (L) 05/16/2017   PLT 323 05/16/2017   GLUCOSE 130 (H) 05/14/2017   ALT 15 05/02/2017   AST 17 05/02/2017   NA 134 (L) 05/14/2017   K 3.7 05/14/2017   CL 102 05/14/2017   CREATININE 0.73 05/14/2017   BUN 7 05/14/2017   CO2 24 05/14/2017   INR 0.96 05/02/2017    Assessment/Plan:  Acute left-sided thoracic back pain  -concern for pyelo vs renal calculi - Plan: POCT urinalysis dipstick, CBC with Differential/Platelet, Basic metabolic panel, sulfamethoxazole-trimethoprim (BACTRIM DS,SEPTRA DS) 800-160 MG tablet, traMADol (ULTRAM)  50 MG tablet  Urinary tract infection with hematuria, site unspecified  -UA with 2+ RBCs, SG 1.025 - Plan: sulfamethoxazole-trimethoprim (BACTRIM DS,SEPTRA DS) 800-160 MG tablet, CT RENAL STONE STUDY, Urine Culture, Urine Culture  F/u prn  Abbe Amsterdam, MD

## 2018-01-19 NOTE — Patient Instructions (Signed)

## 2018-01-20 ENCOUNTER — Encounter: Payer: Self-pay | Admitting: Family Medicine

## 2018-01-22 ENCOUNTER — Other Ambulatory Visit: Payer: Self-pay

## 2018-01-22 ENCOUNTER — Ambulatory Visit (HOSPITAL_COMMUNITY)
Admission: EM | Admit: 2018-01-22 | Discharge: 2018-01-22 | Disposition: A | Discharge status: Home / Self Care | Payer: Managed Care, Other (non HMO) | Attending: Family Medicine | Admitting: Family Medicine

## 2018-01-22 ENCOUNTER — Encounter (HOSPITAL_COMMUNITY): Payer: Self-pay | Admitting: Emergency Medicine

## 2018-01-22 DIAGNOSIS — N390 Urinary tract infection, site not specified: Secondary | ICD-10-CM

## 2018-01-22 DIAGNOSIS — R319 Hematuria, unspecified: Secondary | ICD-10-CM

## 2018-01-22 DIAGNOSIS — R109 Unspecified abdominal pain: Secondary | ICD-10-CM

## 2018-01-22 LAB — POCT URINALYSIS DIP (DEVICE)
BILIRUBIN URINE: NEGATIVE
GLUCOSE, UA: NEGATIVE mg/dL
Ketones, ur: NEGATIVE mg/dL
Leukocytes, UA: NEGATIVE
NITRITE: POSITIVE — AB
Protein, ur: NEGATIVE mg/dL
UROBILINOGEN UA: 0.2 mg/dL (ref 0.0–1.0)
pH: 5.5 (ref 5.0–8.0)

## 2018-01-22 LAB — URINE CULTURE
MICRO NUMBER: 90769397
SPECIMEN QUALITY:: ADEQUATE

## 2018-01-22 MED ORDER — CYCLOBENZAPRINE HCL 5 MG PO TABS: 5.0000 mg | ORAL_TABLET | Freq: Three times a day (TID) | ORAL | 0 refills | Status: DC | PRN

## 2018-01-22 MED ORDER — CEFTRIAXONE SODIUM 1 G IJ SOLR
1.0000 g | INTRAMUSCULAR | Status: DC
  Administered 2018-01-22: 1 g via INTRAMUSCULAR

## 2018-01-22 MED ORDER — DIPHENHYDRAMINE HCL 25 MG PO CAPS
25.0000 mg | ORAL_CAPSULE | Freq: Once | ORAL | Status: AC
  Administered 2018-01-22: 25 mg via ORAL

## 2018-01-22 MED ORDER — CEFTRIAXONE SODIUM 1 G IJ SOLR
INTRAMUSCULAR | Status: AC
  Filled 2018-01-22: qty 10

## 2018-01-22 MED ORDER — DIPHENHYDRAMINE HCL 25 MG PO CAPS
ORAL_CAPSULE | ORAL | Status: AC
  Filled 2018-01-22: qty 1

## 2018-01-22 NOTE — ED Provider Notes (Signed)
[facility]    CSN: [id number] Arrival date & time: 01/22/18  1958     History   Chief Complaint Chief Complaint  Patient presents with  . Back Pain    HPI Lauren Boyle is a 52 y.o. female.   HPI  Patient is here for persistent left flank pain.  She states it is not getting any better.  In addition she is having some dysuria and frequency.  No hematuria.  No nausea or vomiting.  No fever or chills.  The pain in her flank is constant.  Worse with movement.  No radiation.  No accident or injury.  She was seen in the emergency room on 01/12/2018.  She was diagnosed with a urinary tract infection.  She was given a sulfa antibiotic.  She was given tramadol for pain.  Since she did not see improvement she went to her primary care doctor on 01/19/2018.  The primary care doctor was worried about kidney stone or kidney involvement.  She put on another week of Septra, and refill tramadol.  In addition she ordered a renal CT.  The scan is scheduled for 01/25/2018.  Patient was scheduled to work last night and tonight.  She does not feel well enough to go.  She would like a note for work.  In review of the culture that was done on 01/12/2018 she has a multi resistant E. coli.  It is resistant to Septra.  The results are discussed with the patient.  It is no wonder she is not improved.  Her urinalysis today still has nitrites and hematuria.  Past Medical History:  Diagnosis Date  . Arthritis   . Chicken pox   . Depression   . History of meniscal tear    left  . Iron deficiency   . Osteoarthritis of knee    left    Patient Active Problem List   Diagnosis Date Noted  . Primary localized osteoarthritis of left knee 05/13/2017  . Adrenal mass, right (HCC) 05/27/2014  . Low back pain 07/04/2013    Past Surgical History:  Procedure Laterality Date  . ABDOMINAL HYSTERECTOMY     partial  . CHONDROPLASTY Left 09/17/2016   Procedure: CHONDROPLASTY;  Surgeon: Frederico Hamman,  MD;  Location: [facility];  Service: Orthopedics;  Laterality: Left;  . COLONOSCOPY    . KNEE ARTHROSCOPY WITH MEDIAL MENISECTOMY Left 09/17/2016   Procedure: KNEE ARTHROSCOPY WITH PARTIAL MEDIAL MENISECTOMY;  Surgeon: Frederico Hamman, MD;  Location: [facility];  Service: Orthopedics;  Laterality: Left;  . ORIF ULNAR FRACTURE Left 04/14/2015   Procedure: OPEN REDUCTION INTERNAL FIXATION (ORIF) ULNAR FRACTURE;  Surgeon: Eldred Manges, MD;  Location: [facility];  Service: Orthopedics;  Laterality: Left;  . TOTAL KNEE ARTHROPLASTY Left 05/13/2017   Procedure: LEFT TOTAL KNEE ARTHROPLASTY;  Surgeon: Frederico Hamman, MD;  [facility]: [facility];  Service: Orthopedics;  Laterality: Left;    OB History   None      Home Medications    Prior to Admission medications   Medication Sig Start Date End Date Taking? Authorizing Provider  cyclobenzaprine (FLEXERIL) 5 MG tablet Take 1 tablet (5 mg total) by mouth 3 (three) times daily as needed for muscle spasms. 01/22/18   Eustace Moore, MD  traMADol (ULTRAM) 50 MG tablet Take 1 tablet (50 mg total) by mouth every 8 (eight) hours as needed for up to 7 days. 01/19/18 01/26/18  Deeann Saint, MD    Family  History Family History  Problem Relation Age of Onset  . Hyperlipidemia Mother   . Hypertension Mother   . Hypertension Father   . Hyperlipidemia Father   . Diabetes Father   . Cancer Sister        ovarian  . Heart attack Neg Hx   . Sudden death Neg Hx     Social History Social History   Tobacco Use  . Smoking status: Current Some Day Smoker    Packs/day: 0.25    Types: Cigarettes  . Smokeless tobacco: Never Used  Substance Use Topics  . Alcohol use: Yes    Alcohol/week: 4.2 oz    Types: 7 Glasses of wine per week    Comment: social  . Drug use: No     Allergies   Penicillins   Review of Systems Review of Systems  Constitutional: Positive for appetite change. Negative for chills and fever.  HENT:  Negative for ear pain and sore throat.   Eyes: Negative for pain and visual disturbance.  Respiratory: Negative for cough and shortness of breath.   Cardiovascular: Negative for chest pain and palpitations.  Gastrointestinal: Negative for abdominal pain, nausea and vomiting.  Genitourinary: Positive for dysuria, flank pain and frequency. Negative for hematuria.  Musculoskeletal: Positive for back pain. Negative for arthralgias.  Skin: Negative for color change and rash.  Neurological: Negative for seizures and syncope.  Psychiatric/Behavioral: Positive for sleep disturbance.  All other systems reviewed and are negative.    Physical Exam Triage Vital Signs ED Triage Vitals  Enc Vitals Group     BP 01/22/18 2011 133/70     Pulse Rate 01/22/18 2011 60     Resp 01/22/18 2011 18     Temp 01/22/18 2011 97.7 F (36.5 C)     Temp Source 01/22/18 2011 Temporal     SpO2 01/22/18 2011 99 %     Weight --      Height --      Head Circumference --      Peak Flow --      Pain Score 01/22/18 2009 8     Pain Loc --      Pain Edu? --      Excl. in GC? --    No data found.  Updated Vital Signs BP 133/70 (BP Location: Left Arm)   Pulse 60   Temp 97.7 F (36.5 C) (Temporal)   Resp 18   SpO2 99%   Physical Exam  Constitutional: She appears well-developed and well-nourished. She appears distressed.  Moderately uncomfortable  HENT:  Head: Normocephalic and atraumatic.  Mouth/Throat: Oropharynx is clear and moist.  Eyes: Pupils are equal, round, and reactive to light. Conjunctivae are normal.  Neck: Normal range of motion.  Cardiovascular: Normal rate, regular rhythm and normal heart sounds.  Pulmonary/Chest: Effort normal and breath sounds normal. No respiratory distress.  Abdominal: Soft. She exhibits no distension. There is no tenderness.  Musculoskeletal: Normal range of motion. She exhibits no edema.       Back:  Lymphadenopathy:    She has no cervical adenopathy.    Neurological: She is alert.  Skin: Skin is warm and dry.     UC Treatments / Results  Labs (all labs ordered are listed, but only abnormal results are displayed) Labs Reviewed  POCT URINALYSIS DIP (DEVICE) - Abnormal; Notable for the following components:      Result Value   Hgb urine dipstick SMALL (*)    Nitrite POSITIVE (*)  All other components within normal limits    EKG None  Radiology No results found.  Procedures Procedures (including critical care time)  Medications Ordered in UC Medications  cefTRIAXone (ROCEPHIN) injection 1 g (1 g Intramuscular Given 01/22/18 2108)  diphenhydrAMINE (BENADRYL) capsule 25 mg (25 mg Oral Given 01/22/18 2108)    Initial Impression / Assessment and Plan / UC Course  I have reviewed the triage vital signs and the nursing notes.  Pertinent labs & imaging results that were available during my care of the patient were reviewed by me and considered in my medical decision making (see chart for details).     Discussed with patient her urinary tract infection.  Discussed that I believe she also has some thoracic muscular strain.  Possibly has pyelonephritis although she has no fever, chills, nausea, or vomiting that normally would go with acute pyelonephritis.  The multiresistant Escherichia coli looks challenging to treat.  No oral antibiotics are on the panel listed as sensitive.  We are giving her a shot of Rocephin in the hopes that she will see some improvement tonight.  She is to call her family doctor tomorrow.  Continue tramadol for pain.  Rest.  I also gave her Flexeril for the muscular tenderness. Final Clinical Impressions(s) / UC Diagnoses   Final diagnoses:  Acute flank pain  Urinary tract infection with hematuria, site unspecified     Discharge Instructions     Drink plenty of fluids Call your doctor in the morning.  Let them know about your antibiotic. Stop the sulfamethoxazole antibiotic Continue taking tramadol as  needed for pain You may take cyclobenzaprine as a muscle relaxer, this will help you sleep Rest, avoid bending and lifting Note to be off work 3 days Go to ER for worsening pain, fever or chills, vomiting, or itching from the injection.   ED Prescriptions    Medication Sig Dispense Auth. Provider   cyclobenzaprine (FLEXERIL) 5 MG tablet Take 1 tablet (5 mg total) by mouth 3 (three) times daily as needed for muscle spasms. 30 tablet Eustace Moore, MD     Controlled Substance Prescriptions [place] Controlled Substance Registry consulted? Not indicated   Eustace Moore, MD 01/22/18 2120

## 2018-01-22 NOTE — ED Triage Notes (Signed)
Onset 3 1/2 weeks ago of back pain, flank pain.  Patient seen in ed and started on antibiotics.  Also saw her pcp.  Still waiting on blood work results.  Pain is getting worse.    Currently has pain with urination.  This is a new complaint.  This started yesterday

## 2018-01-22 NOTE — Discharge Instructions (Addendum)
Drink plenty of fluids Call your doctor in the morning.  Let them know about your antibiotic. Stop the sulfamethoxazole antibiotic Continue taking tramadol as needed for pain You may take cyclobenzaprine as a muscle relaxer, this will help you sleep Rest, avoid bending and lifting Note to be off work 3 days Go to ER for worsening pain, fever or chills, vomiting, or itching from the injection.

## 2018-01-23 ENCOUNTER — Encounter (HOSPITAL_COMMUNITY): Payer: Self-pay | Admitting: *Deleted

## 2018-01-23 ENCOUNTER — Emergency Department (HOSPITAL_COMMUNITY): Payer: Managed Care, Other (non HMO)

## 2018-01-23 ENCOUNTER — Other Ambulatory Visit: Payer: Self-pay

## 2018-01-23 ENCOUNTER — Ambulatory Visit: Payer: Self-pay | Admitting: Family Medicine

## 2018-01-23 ENCOUNTER — Emergency Department (HOSPITAL_COMMUNITY)
Admission: EM | Admit: 2018-01-23 | Discharge: 2018-01-23 | Disposition: A | Discharge status: Home / Self Care | Payer: Managed Care, Other (non HMO) | Attending: Emergency Medicine | Admitting: Emergency Medicine

## 2018-01-23 DIAGNOSIS — F17210 Nicotine dependence, cigarettes, uncomplicated: Secondary | ICD-10-CM | POA: Diagnosis not present

## 2018-01-23 DIAGNOSIS — Z96652 Presence of left artificial knee joint: Secondary | ICD-10-CM | POA: Insufficient documentation

## 2018-01-23 DIAGNOSIS — F329 Major depressive disorder, single episode, unspecified: Secondary | ICD-10-CM | POA: Diagnosis not present

## 2018-01-23 DIAGNOSIS — R8279 Other abnormal findings on microbiological examination of urine: Secondary | ICD-10-CM | POA: Insufficient documentation

## 2018-01-23 DIAGNOSIS — R1032 Left lower quadrant pain: Secondary | ICD-10-CM | POA: Diagnosis present

## 2018-01-23 LAB — COMPREHENSIVE METABOLIC PANEL
ALT: 14 U/L (ref 0–44)
ANION GAP: 9 (ref 5–15)
AST: 16 U/L (ref 15–41)
Albumin: 4.1 g/dL (ref 3.5–5.0)
Alkaline Phosphatase: 78 U/L (ref 38–126)
BILIRUBIN TOTAL: 0.5 mg/dL (ref 0.3–1.2)
BUN: 16 mg/dL (ref 6–20)
CALCIUM: 9.6 mg/dL (ref 8.9–10.3)
CO2: 25 mmol/L (ref 22–32)
Chloride: 107 mmol/L (ref 98–111)
Creatinine, Ser: 1.05 mg/dL — ABNORMAL HIGH (ref 0.44–1.00)
GFR calc Af Amer: >60 mL/min (ref >60)
GFR, EST NON AFRICAN AMERICAN: 60 mL/min — AB (ref >60)
Glucose, Bld: 91 mg/dL (ref 70–99)
POTASSIUM: 4.1 mmol/L (ref 3.5–5.1)
Sodium: 141 mmol/L (ref 135–145)
TOTAL PROTEIN: 7.4 g/dL (ref 6.5–8.1)

## 2018-01-23 LAB — URINALYSIS, ROUTINE W REFLEX MICROSCOPIC
BACTERIA UA: NONE SEEN
Bilirubin Urine: NEGATIVE
GLUCOSE, UA: NEGATIVE mg/dL
Ketones, ur: NEGATIVE mg/dL
LEUKOCYTES UA: NEGATIVE
NITRITE: NEGATIVE
PH: 5 (ref 5.0–8.0)
PROTEIN: NEGATIVE mg/dL
SPECIFIC GRAVITY, URINE: 1.026 (ref 1.005–1.030)

## 2018-01-23 LAB — CBC
HEMATOCRIT: 42.9 % (ref 36.0–46.0)
Hemoglobin: 13.4 g/dL (ref 12.0–15.0)
MCH: 27.6 pg (ref 26.0–34.0)
MCHC: 31.2 g/dL (ref 30.0–36.0)
MCV: 88.3 fL (ref 78.0–100.0)
Platelets: 398 10*3/uL (ref 150–400)
RBC: 4.86 MIL/uL (ref 3.87–5.11)
RDW: 14 % (ref 11.5–15.5)
WBC: 7.3 10*3/uL (ref 4.0–10.5)

## 2018-01-23 MED ORDER — SODIUM CHLORIDE 0.9 % IV SOLN
2.0000 g | Freq: Once | INTRAVENOUS | Status: AC
  Administered 2018-01-23: 2 g via INTRAVENOUS
  Filled 2018-01-23: qty 2

## 2018-01-23 MED ORDER — SODIUM CHLORIDE 0.9 % IV BOLUS
1000.0000 mL | Freq: Once | INTRAVENOUS | Status: AC
  Administered 2018-01-23: 1000 mL via INTRAVENOUS

## 2018-01-23 MED ORDER — SODIUM CHLORIDE 0.9 % IV BOLUS: 1000.0000 mL | Freq: Once | INTRAVENOUS | Status: DC

## 2018-01-23 MED ORDER — IOHEXOL 300 MG/ML  SOLN
100.0000 mL | Freq: Once | INTRAMUSCULAR | Status: AC | PRN
  Administered 2018-01-23: 100 mL via INTRAVENOUS

## 2018-01-23 MED ORDER — CEFPODOXIME PROXETIL 200 MG PO TABS: 200.0000 mg | ORAL_TABLET | Freq: Two times a day (BID) | ORAL | 0 refills | Status: AC

## 2018-01-23 NOTE — ED Triage Notes (Signed)
Pt is being tx with oral antibiotics for a uti.   Was told by pcp to come to ED for IV antibiotics.  C/o increasing flank pain.

## 2018-01-23 NOTE — ED Provider Notes (Signed)
[facility] Provider Note   CSN: [id number] Arrival date & time: 01/23/18  1527  History   Chief Complaint Chief Complaint  Patient presents with  . Recurrent UTI    HPI Lauren Boyle is a 52 y.o. female with no significant past medical history presenting to the ED for flank pain and concern for pyelonephritis. Per patient and medical records, she initially presented to the ED about 10 days ago for back/flank pain and was found to have UTI. She was started on course of Bactrim and was discharged home. She went to PCP 7 days later for persistent pain and urine culture was sent which grew fairly resistant Escherichia coli, including Bactrim resistant. She was seen again here in the ED yesterdayand was given dose of Rocephin and discharged home. She returns today due to increasing left flank pain. No fever or vomiting. No dysuria or other urinary symptoms. No vaginal discharge or bleeding. No other abdominal pain or recent illness.  The history is provided by the patient and medical records.   Past Medical History:  Diagnosis Date  . Arthritis   . Chicken pox   . Depression   . History of meniscal tear    left  . Iron deficiency   . Osteoarthritis of knee    left    Patient Active Problem List   Diagnosis Date Noted  . Primary localized osteoarthritis of left knee 05/13/2017  . Adrenal mass, right (HCC) 05/27/2014  . Low back pain 07/04/2013    Past Surgical History:  Procedure Laterality Date  . ABDOMINAL HYSTERECTOMY     partial  . CHONDROPLASTY Left 09/17/2016   Procedure: CHONDROPLASTY;  Surgeon: Frederico Hammananiel Caffrey, MD;  Location: [facility];  Service: Orthopedics;  Laterality: Left;  . COLONOSCOPY    . KNEE ARTHROSCOPY WITH MEDIAL MENISECTOMY Left 09/17/2016   Procedure: KNEE ARTHROSCOPY WITH PARTIAL MEDIAL MENISECTOMY;  Surgeon: Frederico Hammananiel Caffrey, MD;  Location: [facility];  Service: Orthopedics;   Laterality: Left;  . ORIF ULNAR FRACTURE Left 04/14/2015   Procedure: OPEN REDUCTION INTERNAL FIXATION (ORIF) ULNAR FRACTURE;  Surgeon: Eldred MangesMark C Yates, MD;  Location: [facility];  Service: Orthopedics;  Laterality: Left;  . TOTAL KNEE ARTHROPLASTY Left 05/13/2017   Procedure: LEFT TOTAL KNEE ARTHROPLASTY;  Surgeon: Frederico Hammanaffrey, Daniel, MD;  [facility];  Service: Orthopedics;  Laterality: Left;     OB History   None      Home Medications    Prior to Admission medications   Medication Sig Start Date End Date Taking? Authorizing Provider  cyclobenzaprine (FLEXERIL) 5 MG tablet Take 1 tablet (5 mg total) by mouth 3 (three) times daily as needed for muscle spasms. 01/22/18  Yes Eustace MooreNelson, Yvonne Sue, MD  traMADol (ULTRAM) 50 MG tablet Take 1 tablet (50 mg total) by mouth every 8 (eight) hours as needed for up to 7 days. 01/19/18 01/26/18 Yes Deeann SaintBanks, Shannon R, MD  cefpodoxime (VANTIN) 200 MG tablet Take 1 tablet (200 mg total) by mouth 2 (two) times daily for 10 days. 01/23/18 02/02/18  Cecille PoMacklin, Nerea Bordenave W, MD    Family History Family History  Problem Relation Age of Onset  . Hyperlipidemia Mother   . Hypertension Mother   . Hypertension Father   . Hyperlipidemia Father   . Diabetes Father   . Cancer Sister        ovarian  . Heart attack Neg Hx   . Sudden death Neg Hx  Social History Social History   Tobacco Use  . Smoking status: Current Some Day Smoker    Packs/day: 0.25    Types: Cigarettes  . Smokeless tobacco: Never Used  Substance Use Topics  . Alcohol use: Yes    Alcohol/week: 4.2 oz    Types: 7 Glasses of wine per week    Comment: social  . Drug use: No     Allergies   Penicillins   Review of Systems Review of Systems  Constitutional: Negative for fever.  HENT: Negative for congestion and sore throat.   Eyes: Negative for visual disturbance.  Respiratory: Negative for shortness of breath.   Cardiovascular: Negative for chest pain.  Gastrointestinal: Negative for  abdominal pain, diarrhea and vomiting.  Genitourinary: Positive for flank pain. Negative for dysuria, urgency, vaginal bleeding and vaginal discharge.  Musculoskeletal: Negative for neck pain.  Skin: Negative for rash.  Neurological: Negative for syncope.  All other systems reviewed and are negative.    Physical Exam Updated Vital Signs BP 110/64 (BP Location: Left Arm)   Pulse 61   Temp 98 F (36.7 C) (Oral)   Resp 16   Ht 5\' 5"  (1.651 m)   Wt 81.6 kg (180 lb)   SpO2 100%   BMI 29.95 kg/m   Physical Exam  Constitutional: She is oriented to person, place, and time. No distress.  HENT:  Head: Normocephalic and atraumatic.  Mouth/Throat: Oropharynx is clear and moist.  Eyes: Pupils are equal, round, and reactive to light. Conjunctivae are normal.  Neck: Neck supple. No tracheal deviation present.  Cardiovascular: Normal rate, regular rhythm, normal heart sounds and intact distal pulses.  No murmur heard. Pulmonary/Chest: Effort normal and breath sounds normal. No stridor. No respiratory distress. She has no wheezes. She has no rales.  Abdominal: Soft. She exhibits no distension and no mass. There is no tenderness. There is CVA tenderness (left). There is no guarding.  Musculoskeletal: She exhibits no edema or deformity.  Neurological: She is alert and oriented to person, place, and time.  Skin: Skin is warm and dry.  Psychiatric: She has a normal mood and affect. Her behavior is normal.  Nursing note and vitals reviewed.    ED Treatments / Results  Labs (all labs ordered are listed, but only abnormal results are displayed) Labs Reviewed  COMPREHENSIVE METABOLIC PANEL - Abnormal; Notable for the following components:      Result Value   Creatinine, Ser 1.05 (*)    GFR calc non Af Amer 60 (*)    All other components within normal limits  URINALYSIS, ROUTINE W REFLEX MICROSCOPIC - Abnormal; Notable for the following components:   APPearance HAZY (*)    Hgb urine  dipstick MODERATE (*)    All other components within normal limits  CBC    EKG None  Radiology Ct Abdomen Pelvis W Contrast  Result Date: 01/23/2018 CLINICAL DATA:  Left-sided flank pain EXAM: CT ABDOMEN AND PELVIS WITH CONTRAST TECHNIQUE: Multidetector CT imaging of the abdomen and pelvis was performed using the standard protocol following bolus administration of intravenous contrast. CONTRAST:  OMNIPAQUE IOHEXOL 300 MG/ML  SOLN COMPARISON:  MRI 04/30/2014, CT 06/29/2013 FINDINGS: Lower chest: No acute abnormality. Hepatobiliary: No focal liver abnormality is seen. No gallstones, gallbladder wall thickening, or biliary dilatation. Pancreas: Unremarkable. No pancreatic ductal dilatation or surrounding inflammatory changes. Spleen: Normal in size without focal abnormality. Adrenals/Urinary Tract: Left adrenal gland is normal. Stable 12 mm right adrenal gland nodule. Kidneys are normal, without  renal calculi, focal lesion, or hydronephrosis. Bladder is unremarkable. Stomach/Bowel: Stomach is within normal limits. Appendix not well seen but no right lower quadrant inflammatory process. No evidence of bowel wall thickening, distention, or inflammatory changes. Moderate to large quantity of stool in the colon. Vascular/Lymphatic: Mild atherosclerosis. No aneurysmal dilatation. No significantly enlarged lymph nodes. Reproductive: Status post hysterectomy. No adnexal masses. Other: Negative for free air or free fluid. Musculoskeletal: No acute or significant osseous findings. IMPRESSION: No CT evidence for acute intra-abdominal or pelvic abnormality. Stable 12 mm right adrenal gland nodule, probable adenoma. Electronically Signed   By: Jasmine Pang M.D.   On: 01/23/2018 21:13    Procedures Procedures (including critical care time)  Medications Ordered in ED Medications  ceFEPIme (MAXIPIME) 2 g in sodium chloride 0.9 % 100 mL IVPB (0 g Intravenous Stopped 01/23/18 2226)  iohexol (OMNIPAQUE) 300 MG/ML  solution 100 mL (100 mLs Intravenous Contrast Given 01/23/18 2045)  sodium chloride 0.9 % bolus 1,000 mL (0 mLs Intravenous Stopped 01/23/18 2226)     Initial Impression / Assessment and Plan / ED Course  I have reviewed the triage vital signs and the nursing notes.  Pertinent labs & imaging results that were available during my care of the patient were reviewed by me and considered in my medical decision making (see chart for details).  Patient is a 52 year old female returning to the ED for possible ongoing UTIwith left flank pain as above.  Given ongoing infection and pain, CT abdomen/pelvis obtained which showed no stone, abscess, or other renal abnormality. Her UA today is abnormal only for moderate hemoglobin. No leukocytosis. Her creatinine is borderline at 1.05. After reviewing culture data it is reassuring that her urine has cleared nitrites from yesterday after Rocephin, suggesting responsiveness to third-generation cephalosporin despite intermediate-range MIC. Patient given IV fluids and dose of cefepime here. She had decision-making age of patient regarding disposition. With her response to Rocephin, discussed option for trial of Vantin for outpatient management which she prefers over admission for IV antibiotics. She states she is following with her PCP tomorrow for work note. We will discharge home with strict return precautions.  Patient informed of all ED findings. Return precautions and follow up plan reviewed. All questions answered.   Final Clinical Impressions(s) / ED Diagnoses   Final diagnoses:  Urine culture positive    ED Discharge Orders        Ordered    cefpodoxime (VANTIN) 200 MG tablet  2 times daily     01/23/18 2214       Cecille Po, MD 01/24/18 6045    Marily Memos, MD 01/24/18 [phone]

## 2018-01-23 NOTE — ED Notes (Signed)
Patient taken to CT.

## 2018-01-24 ENCOUNTER — Encounter: Payer: Self-pay | Admitting: Family Medicine

## 2018-01-24 ENCOUNTER — Ambulatory Visit: Payer: Self-pay | Admitting: Family Medicine

## 2018-01-24 ENCOUNTER — Telehealth: Payer: Self-pay

## 2018-01-24 ENCOUNTER — Ambulatory Visit (INDEPENDENT_AMBULATORY_CARE_PROVIDER_SITE_OTHER): Payer: Managed Care, Other (non HMO) | Admitting: Family Medicine

## 2018-01-24 VITALS — BP 100/82 | HR 64 | Temp 98.1°F | Wt 183.0 lb

## 2018-01-24 DIAGNOSIS — R110 Nausea: Secondary | ICD-10-CM | POA: Diagnosis not present

## 2018-01-24 DIAGNOSIS — R319 Hematuria, unspecified: Secondary | ICD-10-CM | POA: Diagnosis not present

## 2018-01-24 DIAGNOSIS — N390 Urinary tract infection, site not specified: Secondary | ICD-10-CM | POA: Diagnosis not present

## 2018-01-24 LAB — POC URINALSYSI DIPSTICK (AUTOMATED)
BILIRUBIN UA: NEGATIVE
Glucose, UA: NEGATIVE
KETONES UA: NEGATIVE
LEUKOCYTES UA: NEGATIVE
Nitrite, UA: NEGATIVE
PH UA: 6 (ref 5.0–8.0)
PROTEIN UA: NEGATIVE
Urobilinogen, UA: 0.2 E.U./dL

## 2018-01-24 MED ORDER — ONDANSETRON HCL 4 MG PO TABS: 4.0000 mg | ORAL_TABLET | Freq: Three times a day (TID) | ORAL | 0 refills | Status: DC | PRN

## 2018-01-24 NOTE — Telephone Encounter (Signed)
Pt had her scan done last night.

## 2018-01-24 NOTE — Telephone Encounter (Signed)
Copied from CRM [id number]. Topic: Referral - Status >> Jan 24, 2018 11:18 AM Crist InfanteHarrald, Kathy J wrote: Reason for CRM: Carley Hammedva with [facility] called to advise Dr Salomon FickBanks they are recommending an alternate treatment .  They are recommending CT Abd & Pelvis w/wo contrast CPT code [id number] Ref number [id number] Please call  [phone]

## 2018-01-24 NOTE — Patient Instructions (Signed)

## 2018-01-24 NOTE — Progress Notes (Signed)
Subjective:    Patient ID: Lauren MorosStephanie Wharton Boven, female    DOB: 09/07/1965, 52 y.o.   MRN: [id number]  No chief complaint on file.   HPI Patient was seen today for f/u.  Pt with MDR UTI. [facility] given dose of rocephin. Seen in ED yesterday, improvement in UA noted.  Given IVFs , cefepime, and started on cefpodoxime 200 mg BID x 10 days.  CT abd and pelvis obtained in ED and negative.    Pt endorses continued back pain, mild dysuria, nausea with eating, and food not tasting the same.  Pt states she just does not feel well.  Pt denies fever, chills, vomiting, HA, dizziness.  Pt endorses increased stress as she may lose her job from being out d/t this illness.  Pt works at [employer] and stands for 8 hours at a time.  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  Pt endorses financial strain.  Past Medical History:  Diagnosis Date  . Arthritis   . Chicken pox   . Depression   . History of meniscal tear    left  . Iron deficiency   . Osteoarthritis of knee    left    Allergies  Allergen Reactions  . Penicillins Itching    Patient was able to tolerate a penicillin shot on 01/22/18 at urgent care.  Patient states she had "severe itching" reaction to shot of Bicillin.  No difficulty breathing, no throat swelling.  Reaction occurred within 30 minutes to an hour.     ROS General: Denies fever, chills, night sweats, changes in weight, changes in appetite HEENT: Denies headaches, ear pain, changes in vision, rhinorrhea, sore throat  CV: Denies CP, palpitations, SOB, orthopnea Pulm: Denies SOB, cough, wheezing GI: Denies abdominal pain, vomiting, diarrhea, constipation  +nausea GU: Denies hematuria, frequency, vaginal discharge  +dysuria Msk: Denies muscle cramps, joint pains  +back pain Neuro: Denies weakness, numbness, tingling Skin: Denies rashes, bruising Psych: Denies  depression, anxiety, hallucinations     Objective:    Blood pressure 100/82, pulse 64, temperature 98.1 F (36.7 C), temperature source Oral, weight 183 lb (83 kg), SpO2 97 %.   Gen. Pleasant, well-nourished, in no distress, normal affect   HEENT: [place]/AT, face symmetric, no scleral icterus, PERRLA, nares patent without drainage Cardiovascular: RRR, no m/r/g, no peripheral edema Neuro:  A&Ox3, CN II-XII intact, normal gait   Wt Readings from Last 3 Encounters:  01/24/18 183 lb (83 kg)  01/23/18 180 lb (81.6 kg)  01/19/18 183 lb (83 kg)    Lab Results  Component Value Date   WBC 7.3 01/23/2018   HGB 13.4 01/23/2018   HCT 42.9 01/23/2018   PLT 398 01/23/2018   GLUCOSE 91 01/23/2018   ALT 14 01/23/2018   AST 16 01/23/2018   NA 141 01/23/2018   K 4.1 01/23/2018   CL 107 01/23/2018   CREATININE 1.05 (H) 01/23/2018   BUN 16 01/23/2018   CO2 25 01/23/2018   INR 0.96 05/02/2017    Assessment/Plan:  Urinary tract infection with hematuria, site unspecified  -UA improving.  2+ blood and SG 1.030 -Continue Cefpodoxime 200 mg BID x 10 days -discussed increasing po intake of fluids -CT renal stone study to be cancelled at pt had imaging yesterday in the ED.  CT negative for pyelo, abscess, or nephrolithiasis - Plan: ondansetron (ZOFRAN) 4 MG  tablet, POCT Urinalysis Dipstick (Automated)  Nausea  - Plan: ondansetron (ZOFRAN) 4 MG tablet  Pt given a note for work.  Will fill out short term disability or FMLA papers when pt gets them.   Will look into setting up outpatient IV abx infusion in the event of continued symptoms despite po abx.  Given RTC or ED precautions for worsening symptoms. Discussed if s/s of sepsis.  Pt expressed understanding.  Close f/u advised.  Abbe Amsterdam, MD

## 2018-01-25 ENCOUNTER — Other Ambulatory Visit: Payer: Self-pay

## 2018-01-25 ENCOUNTER — Telehealth: Payer: Self-pay

## 2018-01-25 NOTE — Telephone Encounter (Signed)
Awaiting for the FMLA forms.

## 2018-01-25 NOTE — Telephone Encounter (Signed)
Pt called to advise that [employer] will be faxing over FMLA paperwork to be completed. Please call pt with any questions.

## 2018-01-31 ENCOUNTER — Encounter: Payer: Self-pay | Admitting: Family Medicine

## 2018-01-31 ENCOUNTER — Ambulatory Visit (INDEPENDENT_AMBULATORY_CARE_PROVIDER_SITE_OTHER): Payer: Managed Care, Other (non HMO) | Admitting: Family Medicine

## 2018-01-31 VITALS — BP 110/80 | HR 66 | Temp 98.5°F | Wt 180.0 lb

## 2018-01-31 DIAGNOSIS — R319 Hematuria, unspecified: Secondary | ICD-10-CM

## 2018-01-31 DIAGNOSIS — M546 Pain in thoracic spine: Secondary | ICD-10-CM | POA: Diagnosis not present

## 2018-01-31 DIAGNOSIS — N390 Urinary tract infection, site not specified: Secondary | ICD-10-CM

## 2018-01-31 LAB — POC URINALSYSI DIPSTICK (AUTOMATED)
Bilirubin, UA: NEGATIVE
GLUCOSE UA: NEGATIVE
Ketones, UA: NEGATIVE
Leukocytes, UA: NEGATIVE
NITRITE UA: NEGATIVE
Protein, UA: NEGATIVE
SPEC GRAV UA: 1.025 (ref 1.010–1.025)
UROBILINOGEN UA: 0.2 U/dL
pH, UA: 6 (ref 5.0–8.0)

## 2018-01-31 MED ORDER — FOSFOMYCIN TROMETHAMINE 3 G PO PACK: 3.0000 g | PACK | ORAL | 0 refills | Status: AC

## 2018-01-31 MED ORDER — CYCLOBENZAPRINE HCL 5 MG PO TABS: 5.0000 mg | ORAL_TABLET | Freq: Three times a day (TID) | ORAL | 1 refills | Status: DC | PRN

## 2018-01-31 NOTE — Patient Instructions (Signed)

## 2018-01-31 NOTE — Progress Notes (Signed)
Subjective:    Patient ID: Lauren MorosStephanie Wharton Boyle, female    DOB: 10/20/1965, 52 y.o.   MRN: [id number]  No chief complaint on file.   HPI Patient was seen today for f/u on UTI.  [facility] with MDR E. Coli. Pt feels better, but still having back pain and decreased energy. The color of pt's urine has improved.  Pt denies fever, dysuria, frequency, malodorous urine.  On day 8/10 of cefpodoxime 200 mg BID.  Pt states flexeril helped with the back pain.  Pt was unable to pick up zofran from pharmacy as they state they never had the rx.  Per chart review was a confirmed by pharmacy on 01/24/2018 and 5:31 PM.  Past Medical History:  Diagnosis Date  . Arthritis   . Chicken pox   . Depression   . History of meniscal tear    left  . Iron deficiency   . Osteoarthritis of knee    left    Allergies  Allergen Reactions  . Penicillins Itching    Patient was able to tolerate a penicillin shot on 01/22/18 at urgent care.  Patient states she had "severe itching" reaction to shot of Bicillin.  No difficulty breathing, no throat swelling.  Reaction occurred within 30 minutes to an hour.     ROS General: Denies fever, chills, night sweats, changes in weight, changes in appetite HEENT: Denies headaches, ear pain, changes in vision, rhinorrhea, sore throat CV: Denies CP, palpitations, SOB, orthopnea Pulm: Denies SOB, cough, wheezing GI: Denies abdominal pain, nausea, vomiting, diarrhea, constipation GU: Denies dysuria, hematuria, frequency, vaginal discharge Msk: Denies muscle cramps, joint pains +back pain Neuro: Denies weakness, numbness, tingling Skin: Denies rashes, bruising Psych: Denies depression, anxiety, hallucinations     Objective:    Blood pressure 110/80, pulse 66, temperature 98.5 F (36.9 C), temperature source Oral, weight 180 lb (81.6 kg), SpO2 97 %.   Gen. Pleasant, well-nourished, in no distress, normal affect   HEENT: [place]/AT, face symmetric, no scleral icterus, PERRLA, nares  patent without drainage Lungs: no accessory muscle use, CTAB, no wheezes or rales Cardiovascular: RRR, no m/r/g, no peripheral edema Abdomen: BS present, soft, NT/ND, no hepatosplenomegaly, no suprapubic tenderness Musculoskeletal: TTP of L thoracic paraspinal muscles.  No deformities, no cyanosis or clubbing, normal tone Neuro:  A&Ox3, CN II-XII intact, normal gait   Wt Readings from Last 3 Encounters:  01/31/18 180 lb (81.6 kg)  01/24/18 183 lb (83 kg)  01/23/18 180 lb (81.6 kg)    Lab Results  Component Value Date   WBC 7.3 01/23/2018   HGB 13.4 01/23/2018   HCT 42.9 01/23/2018   PLT 398 01/23/2018   GLUCOSE 91 01/23/2018   ALT 14 01/23/2018   AST 16 01/23/2018   NA 141 01/23/2018   K 4.1 01/23/2018   CL 107 01/23/2018   CREATININE 1.05 (H) 01/23/2018   BUN 16 01/23/2018   CO2 25 01/23/2018   INR 0.96 05/02/2017    Assessment/Plan:  Urinary tract infection with hematuria, site unspecified -UA with 2+ blood, SG 1.025.  Similar to UA on 01/24/18 but with improved SG. -Continue Cefpodoxime 200 mg twice daily -We will add fosfomycin.  Will have pt take 2 doses 3 days apart. -Encouraged to increase p.o. intake of fluids -will touch bases with ID in regards to additional recs. - Plan: POCT Urinalysis Dipstick (Automated), fosfomycin (MONUROL) 3 g PACK  Acute left-sided thoracic back pain  -discussed likely 2/2 MSK strain. -heat, massage, etc -given handout on  exercises. - Plan: cyclobenzaprine (FLEXERIL) 5 MG tablet  Will have pt call in 1-2 days to see how she is feeling.  Abbe Amsterdam, MD

## 2018-01-31 NOTE — Telephone Encounter (Signed)
Caller: Andrey SpearmanKayleigh w/ [employer].  Call back #: [phone], ext [phone]  Reason for call: Medical records request for patient's pay replacement was faxed on 01/25/18 but they have not received records. [facility]. Please contact Toula MoosKayleigh if request need to be resent.

## 2018-02-01 ENCOUNTER — Telehealth: Payer: Self-pay | Admitting: Family Medicine

## 2018-02-01 NOTE — Telephone Encounter (Signed)
Copied from CRM [id number]. Topic: Quick Communication - Rx Refill/Question >> Feb 01, 2018  5:17 PM Avie ArenasSimmons, Keslee Harrington L, NT wrote: Medication: fosfomycin (MONUROL) 3 g PACK (MONUROL) 3 g PACK patient would like a prescription for another medication due to fosfomycin costing too much money  Has the patient contacted their pharmacy? yes (Agent: If no, request that the patient contact the pharmacy for the refill. (Agent: If yes, when and what did the pharmacy advise?  Preferred Pharmacy (with phone number or street name): [id number]- [place], [place] - [address] [phone] (Phone) [phone] (Fax)      Agent: Please be advised that RX refills may take up to 3 business days. We ask that you follow-up with your pharmacy.

## 2018-02-02 NOTE — Telephone Encounter (Signed)
Please Advice 

## 2018-02-03 NOTE — Telephone Encounter (Signed)
Spoke with pt regarding the matter.

## 2018-02-14 NOTE — Telephone Encounter (Signed)
Called [place] with [employer] regarding the FMLA form, left a message to fax a copy of the form

## 2018-02-23 ENCOUNTER — Telehealth: Payer: Self-pay | Admitting: Family Medicine

## 2018-02-23 NOTE — Telephone Encounter (Signed)
Requesting a copy of office notes from June 27,2019 through present, please fax to [phone].

## 2018-03-03 ENCOUNTER — Encounter: Payer: Self-pay | Admitting: Family Medicine

## 2018-03-03 DIAGNOSIS — Z0289 Encounter for other administrative examinations: Secondary | ICD-10-CM

## 2018-03-06 NOTE — Telephone Encounter (Signed)
Called pt left a detailed message regarding her FMLA forms which our office has not received to date, requested pt to call our office.

## 2018-03-21 NOTE — Telephone Encounter (Signed)
Dr Salomon FickBanks please advise if ok to fax copies of office notes per pt request

## 2018-03-21 NOTE — Telephone Encounter (Signed)
Left another message for pt to call our office in regards to her FMLA forms that should have been  faxed to our office for completing.

## 2018-03-22 NOTE — Telephone Encounter (Signed)
That's fine

## 2018-03-23 NOTE — Telephone Encounter (Signed)
Requested medical records have been faxed per dr Salomon FickBanks. Confirmation received

## 2018-03-23 NOTE — Telephone Encounter (Signed)
Requested medical records have been faxed per dr Banks. Confirmation received  

## 2018-03-30 ENCOUNTER — Other Ambulatory Visit: Payer: Self-pay | Admitting: *Deleted

## 2019-01-16 ENCOUNTER — Other Ambulatory Visit: Payer: Self-pay

## 2019-01-16 ENCOUNTER — Ambulatory Visit (HOSPITAL_COMMUNITY)
Admission: EM | Admit: 2019-01-16 | Discharge: 2019-01-16 | Disposition: A | Discharge status: Home / Self Care | Payer: Managed Care, Other (non HMO) | Attending: Family Medicine | Admitting: Family Medicine

## 2019-01-16 ENCOUNTER — Telehealth: Payer: Self-pay

## 2019-01-16 ENCOUNTER — Encounter (HOSPITAL_COMMUNITY): Payer: Self-pay | Admitting: Emergency Medicine

## 2019-01-16 ENCOUNTER — Telehealth: Payer: Self-pay | Admitting: *Deleted

## 2019-01-16 DIAGNOSIS — B9789 Other viral agents as the cause of diseases classified elsewhere: Secondary | ICD-10-CM

## 2019-01-16 DIAGNOSIS — Z20822 Contact with and (suspected) exposure to covid-19: Secondary | ICD-10-CM

## 2019-01-16 DIAGNOSIS — R5383 Other fatigue: Secondary | ICD-10-CM | POA: Diagnosis not present

## 2019-01-16 DIAGNOSIS — R05 Cough: Secondary | ICD-10-CM | POA: Diagnosis not present

## 2019-01-16 DIAGNOSIS — M791 Myalgia, unspecified site: Secondary | ICD-10-CM

## 2019-01-16 DIAGNOSIS — J069 Acute upper respiratory infection, unspecified: Secondary | ICD-10-CM | POA: Diagnosis not present

## 2019-01-16 MED ORDER — BENZONATATE 200 MG PO CAPS: 200.0000 mg | ORAL_CAPSULE | Freq: Two times a day (BID) | ORAL | 0 refills | Status: DC | PRN

## 2019-01-16 NOTE — Discharge Instructions (Signed)
Go home to rest Push fluids Take Tessalon every 12 hours for cough May take Mucinex DM in addition Hospital will call you to set up your COVID 19 test You may not return to work until your COVID test is negative You must isolated home until COVID results are available    Person Under Monitoring Name: Lauren Boyle  Location: [address] [place] [id number]   Infection Prevention Recommendations for Individuals Confirmed to have, or Being Evaluated for, 2019 Novel Coronavirus (COVID-19) Infection Who Receive Care at Home  Individuals who are confirmed to have, or are being evaluated for, COVID-19 should follow the prevention steps below until a healthcare provider or local or state health department says they can return to normal activities.  Stay home except to get medical care You should restrict activities outside your home, except for getting medical care. Do not go to work, school, or public areas, and do not use public transportation or taxis.  Call ahead before visiting your doctor Before your medical appointment, call the healthcare provider and tell them that you have, or are being evaluated for, COVID-19 infection. This will help the healthcare providers office take steps to keep other people from getting infected. Ask your healthcare provider to call the local or state health department.  Monitor your symptoms Seek prompt medical attention if your illness is worsening (e.g., difficulty breathing). Before going to your medical appointment, call the healthcare provider and tell them that you have, or are being evaluated for, COVID-19 infection. Ask your healthcare provider to call the local or state health department.  Wear a facemask You should wear a facemask that covers your nose and mouth when you are in the same room with other people and when you visit a healthcare provider. People who live with or visit you should also wear a facemask while  they are in the same room with you.  Separate yourself from other people in your home As much as possible, you should stay in a different room from other people in your home. Also, you should use a separate bathroom, if available.  Avoid sharing household items You should not share dishes, drinking glasses, cups, eating utensils, towels, bedding, or other items with other people in your home. After using these items, you should wash them thoroughly with soap and water.  Cover your coughs and sneezes Cover your mouth and nose with a tissue when you cough or sneeze, or you can cough or sneeze into your sleeve. Throw used tissues in a lined trash can, and immediately wash your hands with soap and water for at least 20 seconds or use an alcohol-based hand rub.  Wash your [employer] Wash your hands often and thoroughly with soap and water for at least 20 seconds. You can use an alcohol-based hand sanitizer if soap and water are not available and if your hands are not visibly dirty. Avoid touching your eyes, nose, and mouth with unwashed hands.   Prevention Steps for Caregivers and Household Members of Individuals Confirmed to have, or Being Evaluated for, COVID-19 Infection Being Cared for in the Home  If you live with, or provide care at home for, a person confirmed to have, or being evaluated for, COVID-19 infection please follow these guidelines to prevent infection:  Follow healthcare providers instructions Make sure that you understand and can help the patient follow any healthcare provider instructions for all care.  Provide for the patients basic needs You should help the patient with basic  needs in the home and provide support for getting groceries, prescriptions, and other personal needs.  Monitor the patients symptoms If they are getting sicker, call his or her medical provider and tell them that the patient has, or is being evaluated for, COVID-19 infection. This will help  the healthcare providers office take steps to keep other people from getting infected. Ask the healthcare provider to call the local or state health department.  Limit the number of people who have contact with the patient If possible, have only one caregiver for the patient. Other household members should stay in another home or place of residence. If this is not possible, they should stay in another room, or be separated from the patient as much as possible. Use a separate bathroom, if available. Restrict visitors who do not have an essential need to be in the home.  Keep older adults, very young children, and other sick people away from the patient Keep older adults, very young children, and those who have compromised immune systems or chronic health conditions away from the patient. This includes people with chronic heart, lung, or kidney conditions, diabetes, and cancer.  Ensure good ventilation Make sure that shared spaces in the home have good air flow, such as from an air conditioner or an opened window, weather permitting.  Wash your hands often Wash your hands often and thoroughly with soap and water for at least 20 seconds. You can use an alcohol based hand sanitizer if soap and water are not available and if your hands are not visibly dirty. Avoid touching your eyes, nose, and mouth with unwashed hands. Use disposable paper towels to dry your hands. If not available, use dedicated cloth towels and replace them when they become wet.  Wear a facemask and gloves Wear a disposable facemask at all times in the room and gloves when you touch or have contact with the patients blood, body fluids, and/or secretions or excretions, such as sweat, saliva, sputum, nasal mucus, vomit, urine, or feces.  Ensure the mask fits over your nose and mouth tightly, and do not touch it during use. Throw out disposable facemasks and gloves after using them. Do not reuse. Wash your hands immediately  after removing your facemask and gloves. If your personal clothing becomes contaminated, carefully remove clothing and launder. Wash your hands after handling contaminated clothing. Place all used disposable facemasks, gloves, and other waste in a lined container before disposing them with other household waste. Remove gloves and wash your hands immediately after handling these items.  Do not share dishes, glasses, or other household items with the patient Avoid sharing household items. You should not share dishes, drinking glasses, cups, eating utensils, towels, bedding, or other items with a patient who is confirmed to have, or being evaluated for, COVID-19 infection. After the person uses these items, you should wash them thoroughly with soap and water.  Wash laundry thoroughly Immediately remove and wash clothes or bedding that have blood, body fluids, and/or secretions or excretions, such as sweat, saliva, sputum, nasal mucus, vomit, urine, or feces, on them. Wear gloves when handling laundry from the patient. Read and follow directions on labels of laundry or clothing items and detergent. In general, wash and dry with the warmest temperatures recommended on the label.  Clean all areas the individual has used often Clean all touchable surfaces, such as counters, tabletops, doorknobs, bathroom fixtures, toilets, phones, keyboards, tablets, and bedside tables, every day. Also, clean any surfaces that may have blood,  body fluids, and/or secretions or excretions on them. Wear gloves when cleaning surfaces the patient has come in contact with. Use a diluted bleach solution (e.g., dilute bleach with 1 part bleach and 10 parts water) or a household disinfectant with a label that says EPA-registered for coronaviruses. To make a bleach solution at home, add 1 tablespoon of bleach to 1 quart (4 cups) of water. For a larger supply, add  cup of bleach to 1 gallon (16 cups) of water. Read labels of  cleaning products and follow recommendations provided on product labels. Labels contain instructions for safe and effective use of the cleaning product including precautions you should take when applying the product, such as wearing gloves or eye protection and making sure you have good ventilation during use of the product. Remove gloves and wash hands immediately after cleaning.  Monitor yourself for signs and symptoms of illness Caregivers and household members are considered close contacts, should monitor their health, and will be asked to limit movement outside of the home to the extent possible. Follow the monitoring steps for close contacts listed on the symptom monitoring form.   ? If you have additional questions, contact your local health department or call the epidemiologist on call at [phone] (available 24/7). ? This guidance is subject to change. For the most up-to-date guidance from [facility], please refer to their website: YouBlogs.pl

## 2019-01-16 NOTE — Telephone Encounter (Signed)
Left voicemail for patient to return call.   COVID 19 test ordered.

## 2019-01-16 NOTE — ED Provider Notes (Signed)
[place]    CSN: [id number] Arrival date & time: 01/16/19  1222      History   Chief Complaint Chief Complaint  Patient presents with  . URI    HPI Lauren Boyle is a 53 y.o. female.   HPI  Patient states that she has been working 7 days a week.  Her company expects a lot of hours from them.  She states that on Sunday she was unable to work.  She was exhausted.  She states she felt on the verge of collapse.  Her whole body hurt.  In addition to the body aches she had some "hot flashes".  No shaking chills.  She developed some cough later in the day.  No sore throat.  No runny nose.  No shortness of breath.  She is here for persistent coughing and chest congestion.  Still has some achiness in her back.  Appetite is diminished.  Normal sense of taste and smell.  No underlying asthma or COPD.  She states that she is a light smoker. No one at home is sick.  She denies anyone work has been sick.  She states she has not always worn her mask.  Past Medical History:  Diagnosis Date  . Arthritis   . Chicken pox   . Depression   . History of meniscal tear    left  . Iron deficiency   . Osteoarthritis of knee    left    Patient Active Problem List   Diagnosis Date Noted  . Primary localized osteoarthritis of left knee 05/13/2017  . Adrenal mass, right ([place]) 05/27/2014  . Low back pain 07/04/2013    Past Surgical History:  Procedure Laterality Date  . ABDOMINAL HYSTERECTOMY     partial  . CHONDROPLASTY Left 09/17/2016   Procedure: CHONDROPLASTY;  Surgeon: Earlie Server, MD;  Location: [place];  Service: Orthopedics;  Laterality: Left;  . COLONOSCOPY    . KNEE ARTHROSCOPY WITH MEDIAL MENISECTOMY Left 09/17/2016   Procedure: KNEE ARTHROSCOPY WITH PARTIAL MEDIAL MENISECTOMY;  Surgeon: Earlie Server, MD;  Location: [place];  Service: Orthopedics;  Laterality: Left;  . ORIF ULNAR FRACTURE Left 04/14/2015   Procedure: OPEN  REDUCTION INTERNAL FIXATION (ORIF) ULNAR FRACTURE;  Surgeon: Marybelle Killings, MD;  Location: [place];  Service: Orthopedics;  Laterality: Left;  . TOTAL KNEE ARTHROPLASTY Left 05/13/2017   Procedure: LEFT TOTAL KNEE ARTHROPLASTY;  Surgeon: Earlie Server, MD;  Location: [place];  Service: Orthopedics;  Laterality: Left;    OB History   No obstetric history on file.      Home Medications    Prior to Admission medications   Medication Sig Start Date End Date Taking? Authorizing Provider  benzonatate (TESSALON) 200 MG capsule Take 1 capsule (200 mg total) by mouth 2 (two) times daily as needed for cough. 01/16/19   Raylene Everts, MD    Family History Family History  Problem Relation Age of Onset  . Hyperlipidemia Mother   . Hypertension Mother   . Hypertension Father   . Hyperlipidemia Father   . Diabetes Father   . Cancer Sister        ovarian  . Heart attack Neg Hx   . Sudden death Neg Hx     Social History Social History   Tobacco Use  . Smoking status: Current Some Day Smoker    Packs/day: 0.25    Types: Cigarettes  . Smokeless tobacco: Never Used  Substance  Use Topics  . Alcohol use: Yes    Alcohol/week: 7.0 standard drinks    Types: 7 Glasses of wine per week    Comment: social  . Drug use: No     Allergies   Penicillins   Review of Systems Review of Systems  Constitutional: Positive for fatigue. Negative for chills and fever.  HENT: Negative for ear pain and sore throat.   Eyes: Negative for pain and visual disturbance.  Respiratory: Positive for cough. Negative for shortness of breath.   Cardiovascular: Negative for chest pain and palpitations.  Gastrointestinal: Negative for abdominal pain and vomiting.  Genitourinary: Negative for dysuria and hematuria.  Musculoskeletal: Positive for myalgias. Negative for arthralgias and back pain.  Skin: Negative for color change and rash.  Neurological: Negative for seizures and syncope.  All other systems  reviewed and are negative.    Physical Exam Triage Vital Signs ED Triage Vitals [01/16/19 1247]  Enc Vitals Group     BP 132/82     Pulse Rate 78     Resp 18     Temp 99.1 F (37.3 C)     Temp Source Oral     SpO2 100 %     Weight      Height      Head Circumference      Peak Flow      Pain Score 5     Pain Loc      Pain Edu?      Excl. in GC?    No data found.  Updated Vital Signs BP 132/82 (BP Location: Right Arm)   Pulse 78   Temp 99.1 F (37.3 C) (Oral)   Resp 18   SpO2 100%  Physical Exam Constitutional:      General: She is not in acute distress.    Appearance: She is well-developed.     Comments: Overweight.  Appears tired.  HENT:     Head: Normocephalic and atraumatic.     Right Ear: Tympanic membrane and ear canal normal.     Left Ear: Tympanic membrane and ear canal normal.     Nose: Nose normal. No congestion.     Mouth/Throat:     Mouth: Mucous membranes are moist.     Pharynx: No posterior oropharyngeal erythema.  Eyes:     Conjunctiva/sclera: Conjunctivae normal.     Pupils: Pupils are equal, round, and reactive to light.  Neck:     Musculoskeletal: Normal range of motion.  Cardiovascular:     Rate and Rhythm: Normal rate and regular rhythm.     Heart sounds: Normal heart sounds.  Pulmonary:     Effort: Pulmonary effort is normal. No respiratory distress.     Breath sounds: Normal breath sounds.     Comments: Breath sounds normal Abdominal:     General: Abdomen is flat. There is no distension.     Palpations: Abdomen is soft.     Tenderness: There is no abdominal tenderness.  Musculoskeletal: Normal range of motion.  Lymphadenopathy:     Cervical: No cervical adenopathy.  Skin:    General: Skin is warm and dry.  Neurological:     Mental Status: She is alert.  Psychiatric:        Mood and Affect: Mood normal.        Behavior: Behavior normal.      UC Treatments / Results  Labs (all labs ordered are listed, but only abnormal  results are displayed) Labs Reviewed -  No data to display  EKG None  Radiology No results found.  Procedures Procedures (including critical care time)  Medications Ordered in UC Medications - No data to display  Initial Impression / Assessment and Plan / UC Course  I have reviewed the triage vital signs and the nursing notes.  Pertinent labs & imaging results that were available during my care of the patient were reviewed by me and considered in my medical decision making (see chart for details).    Patient was initially resistant to COVID-19 testing.  She does not think this is what she has.  I told her that I could not be positive either, but with sudden onset of body aches and fatigue and development of cough it was certainly possible.  I recommend she had the testing done and stay out of work until the results are available. Final Clinical Impressions(s) / UC Diagnoses   Final diagnoses:  Viral URI with cough  Suspected Covid-19 Virus Infection     Discharge Instructions     Go home to rest Push fluids Take Tessalon every 12 hours for cough May take Mucinex DM in addition Hospital will call you to set up your COVID 19 test You may not return to work until your COVID test is negative You must isolated home until COVID results are available    Person Under Monitoring Name: Havery MorosStephanie Wharton Kudo  [place] [address] [place] [id number]   Infection Prevention Recommendations for Individuals Confirmed to have, or Being Evaluated for, 2019 Novel Coronavirus (COVID-19) Infection Who Receive Care at Home  Individuals who are confirmed to have, or are being evaluated for, COVID-19 should follow the prevention steps below until a healthcare provider or local or state health department says they can return to normal activities.  Stay home except to get medical care You should restrict activities outside your home, except for getting medical care. Do not go  to work, school, or public areas, and do not use public transportation or taxis.  Call ahead before visiting your doctor Before your medical appointment, call the healthcare provider and tell them that you have, or are being evaluated for, COVID-19 infection. This will help the healthcare provider's office take steps to keep other people from getting infected. Ask your healthcare provider to call the local or state health department.  Monitor your symptoms Seek prompt medical attention if your illness is worsening (e.g., difficulty breathing). Before going to your medical appointment, call the healthcare provider and tell them that you have, or are being evaluated for, COVID-19 infection. Ask your healthcare provider to call the local or state health department.  Wear a facemask You should wear a facemask that covers your nose and mouth when you are in the same room with other people and when you visit a healthcare provider. People who live with or visit you should also wear a facemask while they are in the same room with you.  Separate yourself from other people in your home As much as possible, you should stay in a different room from other people in your home. Also, you should use a separate bathroom, if available.  Avoid sharing household items You should not share dishes, drinking glasses, cups, eating utensils, towels, bedding, or other items with other people in your home. After using these items, you should wash them thoroughly with soap and water.  Cover your coughs and sneezes Cover your mouth and nose with a tissue when you cough or sneeze, or you  can cough or sneeze into your sleeve. Throw used tissues in a lined trash can, and immediately wash your hands with soap and water for at least 20 seconds or use an alcohol-based hand rub.  Wash your [employer] Corporationhands Wash your hands often and thoroughly with soap and water for at least 20 seconds. You can use an alcohol-based hand sanitizer  if soap and water are not available and if your hands are not visibly dirty. Avoid touching your eyes, nose, and mouth with unwashed hands.   Prevention Steps for Caregivers and Household Members of Individuals Confirmed to have, or Being Evaluated for, COVID-19 Infection Being Cared for in the Home  If you live with, or provide care at home for, a person confirmed to have, or being evaluated for, COVID-19 infection please follow these guidelines to prevent infection:  Follow healthcare provider's instructions Make sure that you understand and can help the patient follow any healthcare provider instructions for all care.  Provide for the patient's basic needs You should help the patient with basic needs in the home and provide support for getting groceries, prescriptions, and other personal needs.  Monitor the patient's symptoms If they are getting sicker, call his or her medical provider and tell them that the patient has, or is being evaluated for, COVID-19 infection. This will help the healthcare provider's office take steps to keep other people from getting infected. Ask the healthcare provider to call the local or state health department.  Limit the number of people who have contact with the patient  If possible, have only one caregiver for the patient.  Other household members should stay in another home or place of residence. If this is not possible, they should stay  in another room, or be separated from the patient as much as possible. Use a separate bathroom, if available.  Restrict visitors who do not have an essential need to be in the home.  Keep older adults, very young children, and other sick people away from the patient Keep older adults, very young children, and those who have compromised immune systems or chronic health conditions away from the patient. This includes people with chronic heart, lung, or kidney conditions, diabetes, and cancer.  Ensure good  ventilation Make sure that shared spaces in the home have good air flow, such as from an air conditioner or an opened window, weather permitting.  Wash your hands often  Wash your hands often and thoroughly with soap and water for at least 20 seconds. You can use an alcohol based hand sanitizer if soap and water are not available and if your hands are not visibly dirty.  Avoid touching your eyes, nose, and mouth with unwashed hands.  Use disposable paper towels to dry your hands. If not available, use dedicated cloth towels and replace them when they become wet.  Wear a facemask and gloves  Wear a disposable facemask at all times in the room and gloves when you touch or have contact with the patient's blood, body fluids, and/or secretions or excretions, such as sweat, saliva, sputum, nasal mucus, vomit, urine, or feces.  Ensure the mask fits over your nose and mouth tightly, and do not touch it during use.  Throw out disposable facemasks and gloves after using them. Do not reuse.  Wash your hands immediately after removing your facemask and gloves.  If your personal clothing becomes contaminated, carefully remove clothing and launder. Wash your hands after handling contaminated clothing.  Place all used disposable facemasks, gloves,  and other waste in a lined container before disposing them with other household waste.  Remove gloves and wash your hands immediately after handling these items.  Do not share dishes, glasses, or other household items with the patient  Avoid sharing household items. You should not share dishes, drinking glasses, cups, eating utensils, towels, bedding, or other items with a patient who is confirmed to have, or being evaluated for, COVID-19 infection.  After the person uses these items, you should wash them thoroughly with soap and water.  Wash laundry thoroughly  Immediately remove and wash clothes or bedding that have blood, body fluids, and/or  secretions or excretions, such as sweat, saliva, sputum, nasal mucus, vomit, urine, or feces, on them.  Wear gloves when handling laundry from the patient.  Read and follow directions on labels of laundry or clothing items and detergent. In general, wash and dry with the warmest temperatures recommended on the label.  Clean all areas the individual has used often  Clean all touchable surfaces, such as counters, tabletops, doorknobs, bathroom fixtures, toilets, phones, keyboards, tablets, and bedside tables, every day. Also, clean any surfaces that may have blood, body fluids, and/or secretions or excretions on them.  Wear gloves when cleaning surfaces the patient has come in contact with.  Use a diluted bleach solution (e.g., dilute bleach with 1 part bleach and 10 parts water) or a household disinfectant with a label that says EPA-registered for coronaviruses. To make a bleach solution at home, add 1 tablespoon of bleach to 1 quart (4 cups) of water. For a larger supply, add  cup of bleach to 1 gallon (16 cups) of water.  Read labels of cleaning products and follow recommendations provided on product labels. Labels contain instructions for safe and effective use of the cleaning product including precautions you should take when applying the product, such as wearing gloves or eye protection and making sure you have good ventilation during use of the product.  Remove gloves and wash hands immediately after cleaning.  Monitor yourself for signs and symptoms of illness Caregivers and household members are considered close contacts, should monitor their health, and will be asked to limit movement outside of the home to the extent possible. Follow the monitoring steps for close contacts listed on the symptom monitoring form.   ? If you have additional questions, contact your local health department or call the epidemiologist on call at [phone] (available 24/7). ? This guidance is subject  to change. For the most up-to-date guidance from [facility], please refer to their website: TripMetro.huhttps://www.cdc.gov/coronavirus/2019-ncov/hcp/guidance-prevent-spread.html    ED Prescriptions    Medication Sig Dispense Auth. Provider   benzonatate (TESSALON) 200 MG capsule Take 1 capsule (200 mg total) by mouth 2 (two) times daily as needed for cough. 20 capsule Eustace MooreNelson, Rondall Radigan Sue, MD     Controlled Substance Prescriptions [place] Controlled Substance Registry consulted? Not Applicable   Eustace MooreNelson, Cedrica Brune Sue, MD 01/16/19 [phone]

## 2019-01-16 NOTE — Telephone Encounter (Signed)
Incoming call from Patient to schedule appt.  For covid -19 testing.  Pt scheduled for Wednesday 01/17/19 at 08:00am at [facility] location.  Patient voiced Understanding.

## 2019-01-16 NOTE — ED Triage Notes (Signed)
Pt here for URI sx x 2 days  

## 2019-01-16 NOTE — Telephone Encounter (Signed)
-----   Message from Raylene Everts, MD sent at 01/16/2019  1:19 PM EDT ----- Regarding: needs COVID testing

## 2019-01-17 ENCOUNTER — Other Ambulatory Visit: Payer: Self-pay

## 2019-01-17 DIAGNOSIS — Z20822 Contact with and (suspected) exposure to covid-19: Secondary | ICD-10-CM

## 2019-01-19 ENCOUNTER — Telehealth: Payer: Self-pay | Admitting: Family Medicine

## 2019-01-19 LAB — NOVEL CORONAVIRUS, NAA: SARS-CoV-2, NAA: NOT DETECTED

## 2019-01-19 NOTE — Telephone Encounter (Signed)
Patient called stating she does not feel like the medication benzonatate (TESSALON) 200 MG capsule has been helpful in alleviating her cough. She is requesting call back from CMA to discuss whether something stronger can be called in. Patient also would like to discuss a return to work note after she received covid test results. Please advise.

## 2019-01-22 ENCOUNTER — Encounter: Payer: Self-pay | Admitting: Family Medicine

## 2019-01-22 ENCOUNTER — Ambulatory Visit (INDEPENDENT_AMBULATORY_CARE_PROVIDER_SITE_OTHER): Payer: Managed Care, Other (non HMO) | Admitting: Family Medicine

## 2019-01-22 ENCOUNTER — Other Ambulatory Visit: Payer: Self-pay

## 2019-01-22 DIAGNOSIS — M25562 Pain in left knee: Secondary | ICD-10-CM

## 2019-01-22 DIAGNOSIS — G8929 Other chronic pain: Secondary | ICD-10-CM | POA: Diagnosis not present

## 2019-01-22 MED ORDER — TRAMADOL HCL 50 MG PO TABS: 50.0000 mg | ORAL_TABLET | Freq: Three times a day (TID) | ORAL | 0 refills | Status: AC | PRN

## 2019-01-22 NOTE — Telephone Encounter (Signed)
Please Advise

## 2019-01-22 NOTE — Progress Notes (Signed)
Virtual Visit via Video Note  I connected with Lauren Boyle on 01/22/19 at 11:00 AM EDT by a video enabled telemedicine application and verified that I am speaking with the correct person using two identifiers.  Location patient: home Location provider:work or home office Persons participating in the virtual visit: patient, provider  I discussed the limitations of evaluation and management by telemedicine and the availability of in person appointments. The patient expressed understanding and agreed to proceed.   HPI: Pt had cough, HA, temp 99.8 last wk..  Was seen at [facility].  Had COVID-19 testing on 6/24 which was negative.  Pt scheduled to go back to work tomorrow night.  Pt with pain in her L leg s/p TKR.  States the rod in her leg is irritating her. Pain is in the middle of her knee behind kneecap.  Started hurting 1 wk ago after working several long shifts (8-12 hrs) at work.  Pt stands all shift, works as a Glass blower/designer.  Pt states her knee is swollen.  Pt is requesting pain medicine.  She spoke with Ortho, but her provider is out of town until next wk.  Pt is schedule to go back to work tomorrow night.  Tried Aleeve and tylenol.     ROS: See pertinent positives and negatives per HPI.  Past Medical History:  Diagnosis Date  . Arthritis   . Chicken pox   . Depression   . History of meniscal tear    left  . Iron deficiency   . Osteoarthritis of knee    left    Past Surgical History:  Procedure Laterality Date  . ABDOMINAL HYSTERECTOMY     partial  . CHONDROPLASTY Left 09/17/2016   Procedure: CHONDROPLASTY;  Surgeon: Earlie Server, MD;  Location: [place];  Service: Orthopedics;  Laterality: Left;  . COLONOSCOPY    . KNEE ARTHROSCOPY WITH MEDIAL MENISECTOMY Left 09/17/2016   Procedure: KNEE ARTHROSCOPY WITH PARTIAL MEDIAL MENISECTOMY;  Surgeon: Earlie Server, MD;  Location: [place];  Service: Orthopedics;  Laterality: Left;  .  ORIF ULNAR FRACTURE Left 04/14/2015   Procedure: OPEN REDUCTION INTERNAL FIXATION (ORIF) ULNAR FRACTURE;  Surgeon: Marybelle Killings, MD;  Location: [place];  Service: Orthopedics;  Laterality: Left;  . TOTAL KNEE ARTHROPLASTY Left 05/13/2017   Procedure: LEFT TOTAL KNEE ARTHROPLASTY;  Surgeon: Earlie Server, MD;  Location: [place];  Service: Orthopedics;  Laterality: Left;    Family History  Problem Relation Age of Onset  . Hyperlipidemia Mother   . Hypertension Mother   . Hypertension Father   . Hyperlipidemia Father   . Diabetes Father   . Cancer Sister        ovarian  . Heart attack Neg Hx   . Sudden death Neg Hx      Current Outpatient Medications:  .  benzonatate (TESSALON) 200 MG capsule, Take 1 capsule (200 mg total) by mouth 2 (two) times daily as needed for cough., Disp: 20 capsule, Rfl: 0  EXAM:  VITALS per patient if applicable:  RR between 12-20 bpm  GENERAL: alert, oriented, appears well and in no acute distress  HEENT: atraumatic, conjunctiva clear, no obvious abnormalities on inspection of external nose and ears  NECK: normal movements of the head and neck  LUNGS: on inspection no signs of respiratory distress, breathing rate appears normal, no obvious gross SOB, gasping or wheezing  CV: no obvious cyanosis  MS: moves all visible extremities without noticeable abnormality  PSYCH/NEURO:  pleasant and cooperative, no obvious depression or anxiety, speech and thought processing grossly intact  ASSESSMENT AND PLAN:  Discussed the following assessment and plan:  Chronic pain of left knee  -s/p TKR -pt advised to f/u with Ortho next wk when provider returns to the office. -will provide with limited rx  - Plan: traMADol (ULTRAM) 50 MG tablet  F/u prn.  Pt to schedule CPE.  I discussed the assessment and treatment plan with the patient. The patient was provided an opportunity to ask questions and all were answered. The patient agreed with the plan and demonstrated  an understanding of the instructions.   The patient was advised to call back or seek an in-person evaluation if the symptoms worsen or if the condition fails to improve as anticipated.   Deeann SaintShannon R Breigh Annett, MD

## 2019-01-22 NOTE — Telephone Encounter (Signed)
Pt seen via doxy today.  She was no longer coughing and did not request an alternative cough medication.

## 2019-01-23 ENCOUNTER — Telehealth (HOSPITAL_COMMUNITY): Payer: Self-pay

## 2019-01-23 NOTE — Telephone Encounter (Signed)
Pt states she needs work note now that [place] test has came back negative. Patient is asymptomatic x 7 days. Per Dr. Mannie Stabile okay to give work note to return at next scheduled shift.

## 2019-07-12 ENCOUNTER — Encounter (HOSPITAL_COMMUNITY): Payer: Self-pay

## 2019-07-12 ENCOUNTER — Ambulatory Visit (HOSPITAL_COMMUNITY)
Admission: EM | Admit: 2019-07-12 | Discharge: 2019-07-12 | Disposition: A | Discharge status: Home / Self Care | Payer: Managed Care, Other (non HMO) | Attending: Family Medicine | Admitting: Family Medicine

## 2019-07-12 ENCOUNTER — Other Ambulatory Visit: Payer: Self-pay

## 2019-07-12 DIAGNOSIS — Z20828 Contact with and (suspected) exposure to other viral communicable diseases: Secondary | ICD-10-CM | POA: Diagnosis present

## 2019-07-12 DIAGNOSIS — R05 Cough: Secondary | ICD-10-CM | POA: Diagnosis not present

## 2019-07-12 DIAGNOSIS — R432 Parageusia: Secondary | ICD-10-CM

## 2019-07-12 DIAGNOSIS — H6121 Impacted cerumen, right ear: Secondary | ICD-10-CM

## 2019-07-12 DIAGNOSIS — Z20822 Contact with and (suspected) exposure to covid-19: Secondary | ICD-10-CM

## 2019-07-12 DIAGNOSIS — M791 Myalgia, unspecified site: Secondary | ICD-10-CM | POA: Diagnosis not present

## 2019-07-12 DIAGNOSIS — J029 Acute pharyngitis, unspecified: Secondary | ICD-10-CM | POA: Diagnosis present

## 2019-07-12 LAB — POCT RAPID STREP A: Streptococcus, Group A Screen (Direct): NEGATIVE

## 2019-07-12 NOTE — ED Triage Notes (Signed)
Patient presents to Urgent Care with complaints of covid exposure at work since about 10 days agp. Patient reports her throat became sore and she developed headaches 6 days ago.

## 2019-07-12 NOTE — Discharge Instructions (Addendum)
Your strep test is negative You must quarantine at home until the COVID test result is available/negative Sore throat spray may help, try salt water gargles Tylenol for pain or fever Plenty of fluids Call for problems Your test results will be available on My Chart

## 2019-07-12 NOTE — ED Provider Notes (Signed)
[facility]    CSN: [id number] Arrival date & time: 07/12/19  0806      History   Chief Complaint Chief Complaint  Patient presents with  . covid exposure    HPI Lauren Boyle is a 53 y.o. female.   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 Arthritis   . Chicken pox   . Depression   . History of meniscal tear    left  . Iron deficiency   . Osteoarthritis of knee    left    Patient Active Problem List   Diagnosis Date Noted  . Primary localized osteoarthritis of left knee 05/13/2017  . Adrenal mass, right (HCC) 05/27/2014  . Low back pain 07/04/2013    Past Surgical History:  Procedure Laterality Date  . ABDOMINAL HYSTERECTOMY     partial  . CHONDROPLASTY Left 09/17/2016   Procedure: CHONDROPLASTY;  Surgeon: Frederico Hammananiel Caffrey, MD;  Location: [facility];  Service: Orthopedics;  Laterality: Left;  . COLONOSCOPY    . KNEE ARTHROSCOPY WITH MEDIAL MENISECTOMY Left 09/17/2016   Procedure: KNEE ARTHROSCOPY WITH PARTIAL MEDIAL MENISECTOMY;  Surgeon: Frederico Hammananiel Caffrey, MD;  Location: [facility];  Service: Orthopedics;  Laterality: Left;  . ORIF ULNAR FRACTURE Left 04/14/2015   Procedure: OPEN REDUCTION INTERNAL FIXATION (ORIF) ULNAR FRACTURE;  Surgeon: Eldred MangesMark C Yates, MD;  [facility];  Service: Orthopedics;  Laterality: Left;  . TOTAL KNEE ARTHROPLASTY Left 05/13/2017   Procedure: LEFT TOTAL KNEE ARTHROPLASTY;  Surgeon: Frederico Hammanaffrey, Daniel, MD;  [facility];  Service: Orthopedics;  Laterality: Left;    OB History   No obstetric history on file.      Home Medications    Prior to Admission medications   Medication Sig Start Date End Date Taking? Authorizing Provider  benzonatate (TESSALON)  200 MG capsule Take 1 capsule (200 mg total) by mouth 2 (two) times daily as needed for cough. 01/16/19   Eustace MooreNelson, Gelsey Amyx Sue, MD    Family History Family History  Problem Relation Age of Onset  . Hyperlipidemia Mother   . Hypertension Mother   . Hypertension Father   . Hyperlipidemia Father   . Diabetes Father   . Cancer Sister        ovarian  . Heart attack Neg Hx   . Sudden death Neg Hx     Social History Social History   Tobacco Use  . Smoking status: Current Some Day Smoker    Packs/day: 0.25    Types: Cigarettes  . Smokeless tobacco: Never Used  Substance Use Topics  . Alcohol use: Yes    Alcohol/week: 7.0 standard drinks    Types: 7 Glasses of wine per week    Comment: social  . Drug use: No     Allergies   Penicillins   Review of Systems Review of Systems  Constitutional: Positive for appetite change and fatigue. Negative for chills and fever.  HENT: Positive for ear pain and sore throat. Negative for congestion and hearing loss.   Eyes: Negative for pain.  Respiratory: Positive for cough. Negative for shortness of breath.   Cardiovascular: Negative for chest pain and leg swelling.  Gastrointestinal: Negative for abdominal pain, constipation and diarrhea.  Genitourinary: Negative for dysuria and frequency.  Musculoskeletal: Positive for myalgias.  Neurological: Negative for dizziness, seizures and headaches.  Psychiatric/Behavioral: The patient is not nervous/anxious.      Physical Exam Triage Vital Signs ED Triage Vitals  Enc Vitals Group     BP 07/12/19 0821 134/86     Pulse Rate 07/12/19 0821 68     Resp 07/12/19 0821 16     Temp 07/12/19 0821 99 F (37.2 C)     Temp Source 07/12/19 0821 Oral     SpO2 07/12/19 0821 98 %     Weight --      Height --      Head Circumference --      Peak Flow --      Pain Score 07/12/19 0820 8     Pain Loc --      Pain Edu? --      Excl. in [place]? --    No data found.  Updated Vital Signs BP 134/86 (BP  Location: Right Arm)   Pulse 68   Temp 99 F (37.2 C) (Oral)   Resp 16   SpO2 98%      Physical Exam Constitutional:      General: She is not in acute distress.    Appearance: She is well-developed and normal weight. She is ill-appearing.  HENT:     Head: Normocephalic and atraumatic.     Right Ear: There is impacted cerumen.     Left Ear: Tympanic membrane, ear canal and external ear normal.     Nose: No congestion or rhinorrhea.     Mouth/Throat:     Pharynx: Posterior oropharyngeal erythema present.     Comments: Tonsils mildly swollen, red, no exudate Eyes:     Conjunctiva/sclera: Conjunctivae normal.     Pupils: Pupils are equal, round, and reactive to light.  Cardiovascular:     Rate and Rhythm: Normal rate and regular rhythm.     Heart sounds: Normal heart sounds.  Pulmonary:     Effort: Pulmonary effort is normal. No respiratory distress.     Breath sounds: Normal breath sounds.     Comments: Lungs are clear Abdominal:     General: There is no distension.     Palpations: Abdomen is soft.  Musculoskeletal:        General: Normal range of motion.     Cervical back: Normal range of motion.  Lymphadenopathy:     Cervical: Cervical adenopathy present.  Skin:    General: Skin is warm and dry.  Neurological:     General: No focal deficit present.     Mental Status: She is alert.  Psychiatric:        Mood and Affect: Mood normal.        Behavior: Behavior normal.      UC Treatments / Results  Labs (all labs ordered are listed, but only abnormal results are displayed) Labs Reviewed  NOVEL CORONAVIRUS, NAA (HOSP ORDER, SEND-OUT TO REF LAB; TAT 18-24 HRS)  CULTURE, GROUP A STREP [facility])  POCT RAPID STREP A   Rapid strep is negative  EKG   Radiology No results found.  Procedures Procedures (including critical care time)  Medications Ordered in UC Medications - No data to display  Initial Impression / Assessment and Plan / UC Course  I have reviewed  the triage vital signs and the nursing notes.  Pertinent labs & imaging results that were available during my care of the patient were reviewed  by me and considered in my medical decision making (see chart for details).     Likely viral illness, possible COVID.  Will send test and have patient quarantine until result avialable Final Clinical Impressions(s) / UC Diagnoses   Final diagnoses:  Acute sore throat  Exposure to COVID-19 virus     Discharge Instructions     Your strep test is negative You must quarantine at home until the COVID test result is available/negative Sore throat spray may help, try salt water gargles Tylenol for pain or fever Plenty of fluids Call for problems Your test results will be available on My Chart    ED Prescriptions    None     PDMP not reviewed this encounter.   Eustace Moore, MD 07/12/19 [phone]

## 2019-07-13 LAB — NOVEL CORONAVIRUS, NAA (HOSP ORDER, SEND-OUT TO REF LAB; TAT 18-24 HRS): SARS-CoV-2, NAA: NOT DETECTED

## 2019-07-14 LAB — CULTURE, GROUP A STREP (THRC)

## 2019-07-16 ENCOUNTER — Telehealth (INDEPENDENT_AMBULATORY_CARE_PROVIDER_SITE_OTHER): Payer: Managed Care, Other (non HMO) | Admitting: Family Medicine

## 2019-07-16 ENCOUNTER — Telehealth: Payer: Self-pay

## 2019-07-16 DIAGNOSIS — J069 Acute upper respiratory infection, unspecified: Secondary | ICD-10-CM | POA: Diagnosis not present

## 2019-07-16 DIAGNOSIS — H9201 Otalgia, right ear: Secondary | ICD-10-CM | POA: Diagnosis not present

## 2019-07-16 MED ORDER — FLUTICASONE PROPIONATE 50 MCG/ACT NA SUSP: 1.0000 | Freq: Every day | NASAL | 0 refills | Status: DC

## 2019-07-16 MED ORDER — AZITHROMYCIN 250 MG PO TABS: ORAL_TABLET | ORAL | 0 refills | Status: DC

## 2019-07-16 NOTE — Progress Notes (Signed)
Virtual Visit via Video Note  I connected with Lauren Boyle on 07/16/19 at  2:00 PM EST by a video enabled telemedicine application 2/2 [id number] pandemic and verified that I am speaking with the correct person using two identifiers.  Location patient: home Location provider:work or home office Persons participating in the virtual visit: patient, provider  I discussed the limitations of evaluation and management by telemedicine and the availability of in person appointments. The patient expressed understanding and agreed to proceed.   HPI: Pt was seen at [facility] for chest congestion, HAs, productive cough, sore throat after a few people at work had [place].  Pt's COVID and strep test were negative, but she can't come back to work until she is better.  Pt having pain in R ear when she chews x 1 wk, pain and edema in R side of neck.  Pt notes a few days of diarrhea last wk.  Denies fever, n/v, loss of taste or smell.  Pt went to the dentist and it is not related to her teeth.  Pt has taken Tylenol for her symptoms.   ROS: See pertinent positives and negatives per HPI.  Past Medical History:  Diagnosis Date  . Arthritis   . Chicken pox   . Depression   . History of meniscal tear    left  . Iron deficiency   . Osteoarthritis of knee    left    Past Surgical History:  Procedure Laterality Date  . ABDOMINAL HYSTERECTOMY     partial  . CHONDROPLASTY Left 09/17/2016   Procedure: CHONDROPLASTY;  Surgeon: Earlie Server, MD;  Location: [place];  Service: Orthopedics;  Laterality: Left;  . COLONOSCOPY    . KNEE ARTHROSCOPY WITH MEDIAL MENISECTOMY Left 09/17/2016   Procedure: KNEE ARTHROSCOPY WITH PARTIAL MEDIAL MENISECTOMY;  Surgeon: Earlie Server, MD;  Location: [place];  Service: Orthopedics;  Laterality: Left;  . ORIF ULNAR FRACTURE Left 04/14/2015   Procedure: OPEN REDUCTION INTERNAL FIXATION (ORIF) ULNAR FRACTURE;  Surgeon: Marybelle Killings, MD;   Location: [place];  Service: Orthopedics;  Laterality: Left;  . TOTAL KNEE ARTHROPLASTY Left 05/13/2017   Procedure: LEFT TOTAL KNEE ARTHROPLASTY;  Surgeon: Earlie Server, MD;  Location: [place];  Service: Orthopedics;  Laterality: Left;    Family History  Problem Relation Age of Onset  . Hyperlipidemia Mother   . Hypertension Mother   . Hypertension Father   . Hyperlipidemia Father   . Diabetes Father   . Cancer Sister        ovarian  . Heart attack Neg Hx   . Sudden death Neg Hx      Current Outpatient Medications:  .  benzonatate (TESSALON) 200 MG capsule, Take 1 capsule (200 mg total) by mouth 2 (two) times daily as needed for cough., Disp: 20 capsule, Rfl: 0  EXAM:  VITALS per patient if applicable:  GENERAL: alert, oriented, appears well and in no acute distress  HEENT: atraumatic, conjunctiva clear, no obvious abnormalities on inspection of external nose and ears  NECK: normal movements of the head and neck  LUNGS: on inspection no signs of respiratory distress, breathing rate appears normal, no obvious gross SOB, gasping or wheezing  CV: no obvious cyanosis  MS: moves all visible extremities without noticeable abnormality  PSYCH/NEURO: pleasant and cooperative, no obvious depression or anxiety, speech and thought processing grossly intact  ASSESSMENT AND PLAN:  Discussed the following assessment and plan:  Right ear pain  -ongoing x  1 wk -consider symptoms 2/2 AOM, eustachian tube dysfunction -allergy to PCN. -given precautions -for continued symptoms would need in person visit. - Plan: azithromycin (ZITHROMAX) 250 MG tablet  URI with cough and congestion  -supportive care -given precautions - Plan: fluticasone (FLONASE) 50 MCG/ACT nasal spray  F/u prn   I discussed the assessment and treatment plan with the patient. The patient was provided an opportunity to ask questions and all were answered. The patient agreed with the plan and demonstrated an  understanding of the instructions.   The patient was advised to call back or seek an in-person evaluation if the symptoms worsen or if the condition fails to improve as anticipated.   Deeann Saint, MD

## 2019-07-16 NOTE — Telephone Encounter (Signed)
Called pt detailed message to call the office and schedule appointment per Dr Volanda Napoleon

## 2019-07-18 ENCOUNTER — Encounter: Payer: Self-pay | Admitting: Gastroenterology

## 2019-07-18 ENCOUNTER — Ambulatory Visit (INDEPENDENT_AMBULATORY_CARE_PROVIDER_SITE_OTHER): Payer: Managed Care, Other (non HMO) | Admitting: Family Medicine

## 2019-07-18 ENCOUNTER — Other Ambulatory Visit: Payer: Self-pay

## 2019-07-18 ENCOUNTER — Encounter: Payer: Self-pay | Admitting: Family Medicine

## 2019-07-18 VITALS — BP 138/80 | HR 84 | Temp 97.7°F | Wt 189.0 lb

## 2019-07-18 DIAGNOSIS — H6981 Other specified disorders of Eustachian tube, right ear: Secondary | ICD-10-CM

## 2019-07-18 DIAGNOSIS — R1310 Dysphagia, unspecified: Secondary | ICD-10-CM | POA: Diagnosis not present

## 2019-07-18 DIAGNOSIS — H6121 Impacted cerumen, right ear: Secondary | ICD-10-CM

## 2019-07-18 DIAGNOSIS — M542 Cervicalgia: Secondary | ICD-10-CM

## 2019-07-18 DIAGNOSIS — H9201 Otalgia, right ear: Secondary | ICD-10-CM | POA: Diagnosis not present

## 2019-07-18 MED ORDER — PANTOPRAZOLE SODIUM 40 MG PO TBEC: 40.0000 mg | DELAYED_RELEASE_TABLET | Freq: Every day | ORAL | 3 refills | Status: DC

## 2019-07-18 NOTE — Progress Notes (Signed)
Subjective:    Patient ID: Lauren Boyle, female    DOB: 11-28-1965, 53 y.o.   MRN: [id number]  No chief complaint on file.   HPI Patient was seen today for f/u.  Pt with continued R ear and neck pain.  Pt taking azithromycin x 2 days, but has not noticed much improvement.  Pain is described as sharp, deep inside ear.  Pt also with dysphagia and heart burn with all foods.  In the past was seen by GI.  Pt was on a PPI in the past.  May take Tums, which do not help.  Past Medical History:  Diagnosis Date  . Arthritis   . Chicken pox   . Depression   . History of meniscal tear    left  . Iron deficiency   . Osteoarthritis of knee    left    Allergies  Allergen Reactions  . Penicillins Itching    Patient was able to tolerate a penicillin shot on 01/22/18 at urgent care.  Patient states she had "severe itching" reaction to shot of Bicillin.  No difficulty breathing, no throat swelling.  Reaction occurred within 30 minutes to an hour.     ROS General: Denies fever, chills, night sweats, changes in weight, changes in appetite HEENT: Denies headaches, ear pain, changes in vision, rhinorrhea, sore throat  +R ear, neck pain CV: Denies CP, palpitations, SOB, orthopnea Pulm: Denies SOB, cough, wheezing GI: Denies abdominal pain, nausea, vomiting, diarrhea, constipation  +dysphagia and heart burn GU: Denies dysuria, hematuria, frequency, vaginal discharge Msk: Denies muscle cramps, joint pains Neuro: Denies weakness, numbness, tingling Skin: Denies rashes, bruising Psych: Denies depression, anxiety, hallucinations      Objective:    Blood pressure 138/80, pulse 84, temperature 97.7 F (36.5 C), temperature source Temporal, weight 189 lb (85.7 kg), SpO2 99 %.   Gen. Pleasant, well-nourished, in no distress, normal affect   HEENT: Force/AT, face symmetric, no scleral icterus, PERRLA, EOMI, nares patent without drainage, pharynx without erythema or exudate.  No tonsillar  enlargement.  L TM normal.  R canal occluded with cerumen.  Curette used to remove cerumen.  R TM full after irrigation.  TTP of R preauricular area, R lateral neck, and R mandible.  No tooth decay or gingival edema/erythema noted. Neck: No JVD, no thyromegaly, no carotid bruits Lungs: no accessory muscle use, CTAB, no wheezes or rales Cardiovascular: RRR, no m/r/g, no peripheral edema Abdomen: BS present, soft, NT/ND Musculoskeletal: No deformities, no cyanosis or clubbing, normal tone.  Pain in R neck with resistance of head laterally. Neuro:  A&Ox3, CN II-XII intact, normal gait Skin:  Warm, no lesions/ rash   Wt Readings from Last 3 Encounters:  07/18/19 189 lb (85.7 kg)  01/31/18 180 lb (81.6 kg)  01/24/18 183 lb (83 kg)    Lab Results  Component Value Date   WBC 7.3 01/23/2018   HGB 13.4 01/23/2018   HCT 42.9 01/23/2018   PLT 398 01/23/2018   GLUCOSE 91 01/23/2018   ALT 14 01/23/2018   AST 16 01/23/2018   NA 141 01/23/2018   K 4.1 01/23/2018   CL 107 01/23/2018   CREATININE 1.05 (H) 01/23/2018   BUN 16 01/23/2018   CO2 25 01/23/2018   INR 0.96 05/02/2017    Assessment/Plan:  Right ear pain -likely 2/2 impacted cerumen and eustachian tube dysfunction. -Pt advised to complete Azithromycin course. -ok to taken NASAID or tylenol prn for pain/discomfort. -f/u in 1 wk for continued symptoms  Impacted cerumen of right ear -consent obtained.  Cerumen removed with irrigation and curette.  Pt tolerated procedure. -given handout  Neck pain on right side -likely 2/2 Eustachian tube dysfunction -for continued symptoms consider abscess and obtain imaging -complete Azithromycin course, heat, massage, NSAIDs or Tylenol  Dysphagia, unspecified type  - Plan: Ambulatory referral to Gastroenterology, pantoprazole (PROTONIX) 40 MG tablet  Dysfunction of right eustachian tube -continue flonase -given handout  F/u prn  Grier Mitts, MD  This note is not being shared with  the patient for the following reason: To prevent harm (release of this note would result in harm to the life or physical safety of the patient or another).

## 2019-07-18 NOTE — Patient Instructions (Signed)
Eustachian Tube Dysfunction  Eustachian tube dysfunction refers to a condition in which a blockage develops in the narrow passage that connects the middle ear to the back of the nose (eustachian tube). The eustachian tube regulates air pressure in the middle ear by letting air move between the ear and nose. It also helps to drain fluid from the middle ear space. Eustachian tube dysfunction can affect one or both ears. When the eustachian tube does not function properly, air pressure, fluid, or both can build up in the middle ear. What are the causes? This condition occurs when the eustachian tube becomes blocked or cannot open normally. Common causes of this condition include:  Ear infections.  Colds and other infections that affect the nose, mouth, and throat (upper respiratory tract).  Allergies.  Irritation from cigarette smoke.  Irritation from stomach acid coming up into the esophagus (gastroesophageal reflux). The esophagus is the tube that carries food from the mouth to the stomach.  Sudden changes in air pressure, such as from descending in an airplane or scuba diving.  Abnormal growths in the nose or throat, such as: ? Growths that line the nose (nasal polyps). ? Abnormal growth of cells (tumors). ? Enlarged tissue at the back of the throat (adenoids). What increases the risk? You are more likely to develop this condition if:  You smoke.  You are overweight.  You are a child who has: ? Certain birth defects of the mouth, such as cleft palate. ? Large tonsils or adenoids. What are the signs or symptoms? Common symptoms of this condition include:  A feeling of fullness in the ear.  Ear pain.  Clicking or popping noises in the ear.  Ringing in the ear.  Hearing loss.  Loss of balance.  Dizziness. Symptoms may get worse when the air pressure around you changes, such as when you travel to an area of high elevation, fly on an airplane, or go scuba diving. How is  this diagnosed? This condition may be diagnosed based on:  Your symptoms.  A physical exam of your ears, nose, and throat.  Tests, such as those that measure: ? The movement of your eardrum (tympanogram). ? Your hearing (audiometry). How is this treated? Treatment depends on the cause and severity of your condition.  In mild cases, you may relieve your symptoms by moving air into your ears. This is called "popping the ears."  In more severe cases, or if you have symptoms of fluid in your ears, treatment may include: ? Medicines to relieve congestion (decongestants). ? Medicines that treat allergies (antihistamines). ? Nasal sprays or ear drops that contain medicines that reduce swelling (steroids). ? A procedure to drain the fluid in your eardrum (myringotomy). In this procedure, a small tube is placed in the eardrum to:  Drain the fluid.  Restore the air in the middle ear space. ? A procedure to insert a balloon device through the nose to inflate the opening of the eustachian tube (balloon dilation). Follow these instructions at home: Lifestyle  Do not do any of the following until your health care provider approves: ? Travel to high altitudes. ? Fly in airplanes. ? Work in a pressurized cabin or room. ? Scuba dive.  Do not use any products that contain nicotine or tobacco, such as cigarettes and e-cigarettes. If you need help quitting, ask your health care provider.  Keep your ears dry. Wear fitted earplugs during showering and bathing. Dry your ears completely after. General instructions  Take over-the-counter   and prescription medicines only as told by your health care provider.  Use techniques to help pop your ears as recommended by your health care provider. These may include: ? Chewing gum. ? Yawning. ? Frequent, forceful swallowing. ? Closing your mouth, holding your nose closed, and gently blowing as if you are trying to blow air out of your nose.  Keep all  follow-up visits as told by your health care provider. This is important. Contact a health care provider if:  Your symptoms do not go away after treatment.  Your symptoms come back after treatment.  You are unable to pop your ears.  You have: ? A fever. ? Pain in your ear. ? Pain in your head or neck. ? Fluid draining from your ear.  Your hearing suddenly changes.  You become very dizzy.  You lose your balance. Summary  Eustachian tube dysfunction refers to a condition in which a blockage develops in the eustachian tube.  It can be caused by ear infections, allergies, inhaled irritants, or abnormal growths in the nose or throat.  Symptoms include ear pain, hearing loss, or ringing in the ears.  Mild cases are treated with maneuvers to unblock the ears, such as yawning or ear popping.  Severe cases are treated with medicines. Surgery may also be done (rare). This information is not intended to replace advice given to you by your health care provider. Make sure you discuss any questions you have with your health care provider. Document Released: 08/08/2015 Document Revised: 11/01/2017 Document Reviewed: 11/01/2017 Elsevier Patient Education  [place], Adult The ears produce a substance called earwax that helps keep bacteria out of the ear and protects the skin in the ear canal. Occasionally, earwax can build up in the ear and cause discomfort or hearing loss. What increases the risk? This condition is more likely to develop in people who:  Are female.  Are elderly.  Naturally produce more earwax.  Clean their ears often with cotton swabs.  Use earplugs often.  Use in-ear headphones often.  Wear hearing aids.  Have narrow ear canals.  Have earwax that is overly thick or sticky.  Have eczema.  Are dehydrated.  Have excess hair in the ear canal. What are the signs or symptoms? Symptoms of this condition include:  Reduced or muffled  hearing.  A feeling of fullness in the ear or feeling that the ear is plugged.  Fluid coming from the ear.  Ear pain.  Ear itch.  Ringing in the ear.  Coughing.  An obvious piece of earwax that can be seen inside the ear canal. How is this diagnosed? This condition may be diagnosed based on:  Your symptoms.  Your medical history.  An ear exam. During the exam, your health care provider will look into your ear with an instrument called an otoscope. You may have tests, including a hearing test. How is this treated? This condition may be treated by:  Using ear drops to soften the earwax.  Having the earwax removed by a health care provider. The health care provider may: ? Flush the ear with water. ? Use an instrument that has a loop on the end (curette). ? Use a suction device.  Surgery to remove the wax buildup. This may be done in severe cases. Follow these instructions at home:   Take over-the-counter and prescription medicines only as told by your health care provider.  Do not put any objects, including cotton swabs, into your ear.  You can clean the opening of your ear canal with a washcloth or facial tissue.  Follow instructions from your health care provider about cleaning your ears. Do not over-clean your ears.  Drink enough fluid to keep your urine clear or pale yellow. This will help to thin the earwax.  Keep all follow-up visits as told by your health care provider. If earwax builds up in your ears often or if you use hearing aids, consider seeing your health care provider for routine, preventive ear cleanings. Ask your health care provider how often you should schedule your cleanings.  If you have hearing aids, clean them according to instructions from the manufacturer and your health care provider. Contact a health care provider if:  You have ear pain.  You develop a fever.  You have blood, pus, or other fluid coming from your ear.  You have hearing  loss.  You have ringing in your ears that does not go away.  Your symptoms do not improve with treatment.  You feel like the room is spinning (vertigo). Summary  Earwax can build up in the ear and cause discomfort or hearing loss.  The most common symptoms of this condition include reduced or muffled hearing and a feeling of fullness in the ear or feeling that the ear is plugged.  This condition may be diagnosed based on your symptoms, your medical history, and an ear exam.  This condition may be treated by using ear drops to soften the earwax or by having the earwax removed by a health care provider.  Do not put any objects, including cotton swabs, into your ear. You can clean the opening of your ear canal with a washcloth or facial tissue. This information is not intended to replace advice given to you by your health care provider. Make sure you discuss any questions you have with your health care provider. Document Released: 08/19/2004 Document Revised: 06/24/2017 Document Reviewed: 09/22/2016 Elsevier Patient Education  2020 [employer]  Dysphagia  Dysphagia is trouble swallowing. This condition occurs when solids and liquids stick in a person's throat on the way down to the stomach, or when food takes longer to get to the stomach. You may have problems swallowing food, liquids, or both. You may also have pain while trying to swallow. It may take you more time and effort to swallow something. What are the causes? This condition is caused by:  Problems with the muscles. They may make it difficult for you to move food and liquids through the tube that connects your mouth to your stomach (esophagus). You may have ulcers, scar tissue, or inflammation that blocks the normal passage of food and liquids. Causes of these problems include: ? Acid reflux from your stomach into your esophagus (gastroesophageal reflux). ? Infections. ? Radiation treatment for cancer. ? Medicines taken  without enough fluids to wash them down into your stomach.  Nerve problems. These prevent signals from being sent to the muscles of your esophagus to squeeze (contract) and move what you swallow down to your stomach.  Globus pharyngeus. This is a common problem that involves feeling like something is stuck in the throat or a sense of trouble with swallowing even though nothing is wrong with the swallowing passages.  Stroke. This can affect the nerves and make it difficult to swallow.  Certain conditions, such as cerebral palsy or Parkinson disease. What are the signs or symptoms? Common symptoms of this condition include:  A feeling that solids or liquids are stuck  in your throat on the way down to the stomach.  Food taking too long to get to the stomach. Other symptoms include:  Food moving back from your stomach to your mouth (regurgitation).  Noises coming from your throat.  Chest discomfort with swallowing.  A feeling of fullness when swallowing.  Drooling, especially when the throat is blocked.  Pain while swallowing.  Heartburn.  Coughing or gagging while trying to swallow. How is this diagnosed? This condition is diagnosed by:  Barium X-ray. In this test, you swallow a white substance (contrast medium)that sticks to the inside of your esophagus. X-ray images are then taken.  Endoscopy. In this test, a flexible telescope is inserted down your throat to look at your esophagus and your stomach.  CT scans and MRI. How is this treated? Treatment for dysphagia depends on the cause of the condition:  If the dysphagia is caused by acid reflux or infection, medicines may be used. They may include antibiotics and heartburn medicines.  If the dysphagia is caused by problems with your muscles, swallowing therapy may be used to help you strengthen your swallowing muscles. You may have to do specific exercises to strengthen the muscles or stretch them.  If the dysphagia is  caused by a blockage or mass, procedures to remove the blockage may be done. You may need surgery and a feeding tube. You may need to make diet changes. Ask your health care provider for specific instructions. Follow these instructions at home: Eating and drinking  Try to eat soft food that is easier to swallow.  Follow any diet changes as told by your health care provider.  Cut your food into small pieces and eat slowly.  Eat and drink only when you are sitting upright.  Do not drink alcohol or caffeine. If you need help quitting, ask your health care provider. General instructions  Check your weight every day to make sure you are not losing weight.  Take over-the-counter and prescription medicines only as told by your health care provider.  If you were prescribed an antibiotic medicine, take it as told by your health care provider. Do not stop taking the antibiotic even if you start to feel better.  Do not use any products that contain nicotine or tobacco, such as cigarettes and e-cigarettes. If you need help quitting, ask your health care provider.  Keep all follow-up visits as told by your health care provider. This is important. Contact a health care provider if:  You lose weight because you cannot swallow.  You cough when you drink liquids (aspiration).  You cough up partially digested food. Get help right away if:  You cannot swallow your saliva.  You have shortness of breath or a fever, or both.  You have a hoarse voice and also have trouble swallowing. Summary  Dysphagia is trouble swallowing. This condition occurs when solids and liquids stick in a person's throat on the way down to the stomach, or when food takes longer to get to the stomach.  Dysphagia has many possible causes and symptoms.  Treatment for dysphagia depends on the cause of the condition. This information is not intended to replace advice given to you by your health care provider. Make sure you  discuss any questions you have with your health care provider. Document Released: 07/09/2000 Document Revised: 06/24/2017 Document Reviewed: 07/01/2016 Elsevier Patient Education  2020 [employer].

## 2019-07-31 ENCOUNTER — Encounter: Payer: Self-pay | Admitting: Gastroenterology

## 2019-07-31 ENCOUNTER — Ambulatory Visit (INDEPENDENT_AMBULATORY_CARE_PROVIDER_SITE_OTHER): Payer: Managed Care, Other (non HMO) | Admitting: Gastroenterology

## 2019-07-31 VITALS — BP 114/80 | HR 72 | Temp 96.7°F | Ht 65.0 in | Wt 193.0 lb

## 2019-07-31 DIAGNOSIS — R1310 Dysphagia, unspecified: Secondary | ICD-10-CM

## 2019-07-31 DIAGNOSIS — Z1159 Encounter for screening for other viral diseases: Secondary | ICD-10-CM | POA: Diagnosis not present

## 2019-07-31 DIAGNOSIS — R6881 Early satiety: Secondary | ICD-10-CM

## 2019-07-31 NOTE — Patient Instructions (Addendum)
If you are age 56 or older, your body mass index should be between 23-30. Your Body mass index is 32.12 kg/m. If this is out of the aforementioned range listed, please consider follow up with your Primary Care Provider.  If you are age 2 or younger, your body mass index should be between 19-25. Your Body mass index is 32.12 kg/m. If this is out of the aformentioned range listed, please consider follow up with your Primary Care Provider.   We have sent the following medications to your pharmacy for you to pick up at your convenience: Pantoprazole 40 mg 20-30 minutes before breakfast daily. Pepcid 20 mg each evening before bed.   You have been scheduled for an endoscopy. Please follow written instructions given to you at your visit today. If you use inhalers (even only as needed), please bring them with you on the day of your procedure.

## 2019-07-31 NOTE — Progress Notes (Signed)
HPI: This is a very pleasant 54 year old woman who was referred to me by Deeann Saint, MD  to evaluate dysphagia, early satiety.    For the past 6 to 8 months she has had dysphagia and a sensation of early satiety.  This seems like it is mainly to solid foods but she says even cold water can cause a sensation of dysphagia.  She throws up after just about every meal.  I am not sure if this is clear food from her esophagus or more of a symptom of early satiety, gastric related issue.  She has not had any overt GI bleeding.  Her weight is overall stable.  Over the same period time she has had some pyrosis.  About 1 week ago she was started on Protonix 40 mg and she takes it with breakfast every morning.  She is having a lot of belching at night feeling bloated at night.  She drinks rare caffeine and a small amount of wine on weekends.  She does take a Goody powder but about once a week only.    Review of systems: Pertinent positive and negative review of systems were noted in the above HPI section. All other review negative.   Past Medical History:  Diagnosis Date  . Arthritis   . Chicken pox   . Depression   . History of meniscal tear    left  . Iron deficiency   . Osteoarthritis of knee    left    Past Surgical History:  Procedure Laterality Date  . ABDOMINAL HYSTERECTOMY     partial  . CHONDROPLASTY Left 09/17/2016   Procedure: CHONDROPLASTY;  Surgeon: Frederico Hamman, MD;  Location: [facility];  Service: Orthopedics;  Laterality: Left;  . COLONOSCOPY    . KNEE ARTHROSCOPY WITH MEDIAL MENISECTOMY Left 09/17/2016   Procedure: KNEE ARTHROSCOPY WITH PARTIAL MEDIAL MENISECTOMY;  Surgeon: Frederico Hamman, MD;  Location: [facility];  Service: Orthopedics;  Laterality: Left;  . ORIF ULNAR FRACTURE Left 04/14/2015   Procedure: OPEN REDUCTION INTERNAL FIXATION (ORIF) ULNAR FRACTURE;  Surgeon: Eldred Manges, MD;  Location: [facility] OR;  Service: Orthopedics;   Laterality: Left;  . TOTAL KNEE ARTHROPLASTY Left 05/13/2017   Procedure: LEFT TOTAL KNEE ARTHROPLASTY;  Surgeon: Frederico Hamman, MD;  Location: [facility] OR;  Service: Orthopedics;  Laterality: Left;    Current Outpatient Medications  Medication Sig Dispense Refill  . fluticasone (FLONASE) 50 MCG/ACT nasal spray Place 1 spray into both nostrils daily. 16 g 0  . pantoprazole (PROTONIX) 40 MG tablet Take 1 tablet (40 mg total) by mouth daily. 30 tablet 3   No current facility-administered medications for this visit.    Allergies as of 07/31/2019 - Review Complete 07/31/2019  Allergen Reaction Noted  . Penicillins Itching 11/20/2012    Family History  Problem Relation Age of Onset  . Hyperlipidemia Mother   . Hypertension Mother   . Hypertension Father   . Hyperlipidemia Father   . Diabetes Father   . Cancer Sister        ovarian  . Heart attack Neg Hx   . Sudden death Neg Hx     Social History   Socioeconomic History  . Marital status: Married    Spouse name: Not on file  . Number of children: Not on file  . Years of education: Not on file  . Highest education level: Not on file  Occupational History  . Not on file  Tobacco Use  .  Smoking status: Current Some Day Smoker    Packs/day: 0.25    Types: Cigarettes  . Smokeless tobacco: Never Used  Substance and Sexual Activity  . Alcohol use: Yes    Alcohol/week: 7.0 standard drinks    Types: 7 Glasses of wine per week    Comment: social  . Drug use: No  . Sexual activity: Not on file  Other Topics Concern  . Not on file  Social History Narrative  . Not on file   Social Determinants of Health   Financial Resource Strain:   . Difficulty of Paying Living Expenses: Not on file  Food Insecurity:   . Worried About Charity fundraiser in the Last Year: Not on file  . Ran Out of Food in the Last Year: Not on file  Transportation Needs:   . Lack of Transportation (Medical): Not on file  . Lack of Transportation  (Non-Medical): Not on file  Physical Activity:   . Days of Exercise per Week: Not on file  . Minutes of Exercise per Session: Not on file  Stress:   . Feeling of Stress : Not on file  Social Connections:   . Frequency of Communication with Friends and Family: Not on file  . Frequency of Social Gatherings with Friends and Family: Not on file  . Attends Religious Services: Not on file  . Active Member of Clubs or Organizations: Not on file  . Attends Archivist Meetings: Not on file  . Marital Status: Not on file  Intimate Partner Violence:   . Fear of Current or Ex-Partner: Not on file  . Emotionally Abused: Not on file  . Physically Abused: Not on file  . Sexually Abused: Not on file     Physical Exam: BP 114/80   Pulse 72   Temp (!) 96.7 F (35.9 C)   Ht 5\' 5"  (1.651 m)   Wt 193 lb (87.5 kg)   BMI 32.12 kg/m  Constitutional: generally well-appearing Psychiatric: alert and oriented x3 Eyes: extraocular movements intact Mouth: oral pharynx moist, no lesions Neck: supple no lymphadenopathy Cardiovascular: heart regular rate and rhythm Lungs: clear to auscultation bilaterally Abdomen: soft, nontender, nondistended, no obvious ascites, no peritoneal signs, normal bowel sounds Extremities: no lower extremity edema bilaterally Skin: no lesions on visible extremities   Assessment and plan: 54 y.o. female with dysphagia, early satiety, pyrosis  She has not had significant weight loss and so my suspicion of neoplasm is low.  Possibly this is all acid related.  I recommended upper endoscopy at her soonest convenience to be more certain.  I will be checking for Barrett's esophagus, significant esophageal stricturing, perhaps this is unusual presentation of Candida esophagitis.  Will check for gastritis, peptic ulcers, H. pylori infection.  In the meantime she will stay on proton pump inhibitor but she should knows to start taking it 20 to 30 minutes prior to breakfast  instead of immediately with breakfast.  She will also start taking H2 blocker Pepcid 20 mg pills at bedtime every night for now.    Please see the "Patient Instructions" section for addition details about the plan.   Owens Loffler, MD [facility] 07/31/2019, 2:40 PM  Cc: Billie Ruddy, MD

## 2019-08-01 ENCOUNTER — Encounter: Payer: Self-pay | Admitting: Gastroenterology

## 2019-08-02 ENCOUNTER — Ambulatory Visit (INDEPENDENT_AMBULATORY_CARE_PROVIDER_SITE_OTHER): Payer: Managed Care, Other (non HMO)

## 2019-08-02 DIAGNOSIS — Z1159 Encounter for screening for other viral diseases: Secondary | ICD-10-CM

## 2019-08-03 LAB — SARS CORONAVIRUS 2 (TAT 6-24 HRS): SARS Coronavirus 2: NEGATIVE

## 2019-08-06 ENCOUNTER — Encounter: Payer: Self-pay | Admitting: Gastroenterology

## 2019-08-06 ENCOUNTER — Other Ambulatory Visit: Payer: Self-pay

## 2019-08-06 ENCOUNTER — Ambulatory Visit (AMBULATORY_SURGERY_CENTER): Payer: Managed Care, Other (non HMO) | Admitting: Gastroenterology

## 2019-08-06 VITALS — BP 122/75 | HR 66 | Temp 98.5°F | Resp 25 | Ht 65.0 in | Wt 193.0 lb

## 2019-08-06 DIAGNOSIS — K2950 Unspecified chronic gastritis without bleeding: Secondary | ICD-10-CM

## 2019-08-06 DIAGNOSIS — R1319 Other dysphagia: Secondary | ICD-10-CM

## 2019-08-06 DIAGNOSIS — B9681 Helicobacter pylori [H. pylori] as the cause of diseases classified elsewhere: Secondary | ICD-10-CM

## 2019-08-06 DIAGNOSIS — R1310 Dysphagia, unspecified: Secondary | ICD-10-CM | POA: Diagnosis not present

## 2019-08-06 MED ORDER — SODIUM CHLORIDE 0.9 % IV SOLN: 500.0000 mL | Freq: Once | INTRAVENOUS | Status: DC

## 2019-08-06 NOTE — Progress Notes (Signed)
Pt's states no medical or surgical changes since previsit or office visit. 

## 2019-08-06 NOTE — Patient Instructions (Signed)
Read all of the handouts given to you by your recovery room nurse.  Thank-you for choosing us for your healthcare needs today.  YOU HAD AN ENDOSCOPIC PROCEDURE TODAY AT THE [facility]:   Refer to the procedure report that was given to you for any specific questions about what was found during the examination.  If the procedure report does not answer your questions, please call your gastroenterologist to clarify.  If you requested that your care partner not be given the details of your procedure findings, then the procedure report has been included in a sealed envelope for you to review at your convenience later.  YOU SHOULD EXPECT: Some feelings of bloating in the abdomen. Passage of more gas than usual.  Walking can help get rid of the air that was put into your GI tract during the procedure and reduce the bloating.  Please Note:  You might notice some irritation and congestion in your nose or some drainage.  This is from the oxygen used during your procedure.  There is no need for concern and it should clear up in a day or so.  SYMPTOMS TO REPORT IMMEDIATELY:   Following upper endoscopy (EGD)  Vomiting of blood or coffee ground material  New chest pain or pain under the shoulder blades  Painful or persistently difficult swallowing  New shortness of breath  Fever of 100F or higher  Black, tarry-looking stools  For urgent or emergent issues, a gastroenterologist can be reached at any hour by calling [phone].  DIET:  We do recommend a small meal at first, but then you may proceed to your regular diet.  Drink plenty of fluids but you should avoid alcoholic beverages for 24 hours.  ACTIVITY:  You should plan to take it easy for the rest of today and you should NOT DRIVE or use heavy machinery until tomorrow (because of the sedation medicines used during the test).    FOLLOW UP: Our staff will call the number listed on your records 48-72 hours following your procedure to  check on you and address any questions or concerns that you may have regarding the information given to you following your procedure. If we do not reach you, we will leave a message.  We will attempt to reach you two times.  During this call, we will ask if you have developed any symptoms of COVID 19. If you develop any symptoms (ie: fever, flu-like symptoms, shortness of breath, cough etc.) before then, please call [phone].  If you test positive for Covid 19 in the 2 weeks post procedure, please call and report this information to us.    If any biopsies were taken you will be contacted by phone or by letter within the next 1-3 weeks.  Please call us at [phone] if you have not heard about the biopsies in 3 weeks.    SIGNATURES/CONFIDENTIALITY: You and/or your care partner have signed paperwork which will be entered into your electronic medical record.  These signatures attest to the fact that that the information above on your After Visit Summary has been reviewed and is understood.  Full responsibility of the confidentiality of this discharge information lies with you and/or your care-partner. 

## 2019-08-06 NOTE — Op Note (Signed)
[place] Patient Name: Lauren Boyle [facility] Procedure Date: 08/06/2019 10:06 AM MRN: [id number] Endoscopist: Milus Banister , MD Age: 54 Referring MD:  Date of Birth: [birth year] Gender: Female Account #: [id number] Procedure:                Upper GI endoscopy Indications:              Dyspepsia, Dysphagia Medicines:                Monitored Anesthesia Care Procedure:                Pre-Anesthesia Assessment:                           - Prior to the procedure, a History and Physical                            was performed, and patient medications and                            allergies were reviewed. The patient's tolerance of                            previous anesthesia was also reviewed. The risks                            and benefits of the procedure and the sedation                            options and risks were discussed with the patient.                            All questions were answered, and informed consent                            was obtained. Prior Anticoagulants: The patient has                            taken no previous anticoagulant or antiplatelet                            agents. ASA Grade Assessment: II - A patient with                            mild systemic disease. After reviewing the risks                            and benefits, the patient was deemed in                            satisfactory condition to undergo the procedure.                           After obtaining informed consent, the endoscope was  passed under direct vision. Throughout the                            procedure, the patient's blood pressure, pulse, and                            oxygen saturations were monitored continuously. The                            Endoscope was introduced through the mouth, and                            advanced to the second part of duodenum. The upper                            GI endoscopy was  accomplished without difficulty.                            The patient tolerated the procedure well. Scope In: Scope Out: Findings:                 Mild inflammation was found in the gastric antrum.                            Biopsies were taken with a cold forceps for                            histology.                           The examined esophagus was normal. Biopsies were                            taken with a cold forceps for histology (proximal                            and distal in separate jars).                           The examination was otherwise normal. Complications:            No immediate complications. Estimated blood loss:                            None. Estimated Blood Loss:     Estimated blood loss: none. Impression:               - Gastritis. Biopsied to check for H. pylori.                           - Normal esophagus. Biopsied to check for                            Eosinophilic Esophagitis. Recommendation:           - Patient has a contact number available for  emergencies. The signs and symptoms of potential                            delayed complications were discussed with the                            patient. Return to normal activities tomorrow.                            Written discharge instructions were provided to the                            patient.                           - Resume previous diet.                           - Continue present medications.                           - Await pathology results. Rachael Fee, MD 08/06/2019 10:23:41 AM This report has been signed electronically.

## 2019-08-06 NOTE — Progress Notes (Signed)
Called to room to assist during endoscopic procedure.  Patient ID and intended procedure confirmed with present staff. Received instructions for my participation in the procedure from the performing physician.  

## 2019-08-08 ENCOUNTER — Telehealth: Payer: Self-pay

## 2019-08-08 ENCOUNTER — Other Ambulatory Visit: Payer: Self-pay | Admitting: Family Medicine

## 2019-08-08 DIAGNOSIS — J069 Acute upper respiratory infection, unspecified: Secondary | ICD-10-CM

## 2019-08-08 NOTE — Telephone Encounter (Signed)
1st follow up call made.  NALM 

## 2019-08-08 NOTE — Telephone Encounter (Signed)
2nd follow up call made.  NALM 

## 2019-08-10 ENCOUNTER — Telehealth: Payer: Self-pay | Admitting: Gastroenterology

## 2019-08-10 NOTE — Telephone Encounter (Signed)
See results note. 

## 2019-08-10 NOTE — Telephone Encounter (Signed)
Patient returned your call about results, please call patient one more time.   

## 2019-08-13 ENCOUNTER — Other Ambulatory Visit: Payer: Self-pay

## 2019-08-13 MED ORDER — PYLERA 140-125-125 MG PO CAPS: 3.0000 | ORAL_CAPSULE | Freq: Three times a day (TID) | ORAL | 0 refills | Status: DC

## 2019-08-14 ENCOUNTER — Telehealth: Payer: Self-pay

## 2019-08-14 MED ORDER — DOXYCYCLINE HYCLATE 100 MG PO CAPS: 100.0000 mg | ORAL_CAPSULE | Freq: Two times a day (BID) | ORAL | 0 refills | Status: DC

## 2019-08-14 MED ORDER — BISMUTH SUBSALICYLATE 262 MG PO CHEW: 524.0000 mg | CHEWABLE_TABLET | Freq: Four times a day (QID) | ORAL | 0 refills | Status: DC

## 2019-08-14 MED ORDER — OMEPRAZOLE 20 MG PO CPDR: 20.0000 mg | DELAYED_RELEASE_CAPSULE | Freq: Two times a day (BID) | ORAL | 0 refills | Status: DC

## 2019-08-14 MED ORDER — METRONIDAZOLE 250 MG PO TABS: 250.0000 mg | ORAL_TABLET | Freq: Four times a day (QID) | ORAL | 0 refills | Status: DC

## 2019-08-14 NOTE — Telephone Encounter (Signed)
Patient's insurance denied her Pylera so we sent in the alternative which was okayed by Dr Christella Hartigan.  Flagyl, doxycycline, pepto bismol and omeprazole. I alerted the pharmacy that we are changing the rx. I left patient a detailed phone message that we are changing the pylera rx.

## 2019-08-29 ENCOUNTER — Other Ambulatory Visit: Payer: Self-pay | Admitting: Gastroenterology

## 2019-09-15 ENCOUNTER — Other Ambulatory Visit: Payer: Self-pay | Admitting: Family Medicine

## 2019-09-15 DIAGNOSIS — R1310 Dysphagia, unspecified: Secondary | ICD-10-CM

## 2019-11-06 ENCOUNTER — Other Ambulatory Visit: Payer: Self-pay

## 2019-11-06 ENCOUNTER — Encounter: Payer: Self-pay | Admitting: Family Medicine

## 2019-11-06 ENCOUNTER — Telehealth: Payer: Managed Care, Other (non HMO) | Admitting: Family Medicine

## 2019-11-06 ENCOUNTER — Other Ambulatory Visit: Payer: Self-pay | Admitting: Family Medicine

## 2019-11-06 DIAGNOSIS — R1310 Dysphagia, unspecified: Secondary | ICD-10-CM

## 2019-11-06 NOTE — Telephone Encounter (Signed)
Pt has a MyChart Video visit with Dr Salomon Fick tomorrow afternoon

## 2019-11-07 ENCOUNTER — Telehealth (INDEPENDENT_AMBULATORY_CARE_PROVIDER_SITE_OTHER): Payer: Managed Care, Other (non HMO) | Admitting: Family Medicine

## 2019-11-07 ENCOUNTER — Encounter: Payer: Self-pay | Admitting: Family Medicine

## 2019-11-07 DIAGNOSIS — A048 Other specified bacterial intestinal infections: Secondary | ICD-10-CM

## 2019-11-07 DIAGNOSIS — R1013 Epigastric pain: Secondary | ICD-10-CM

## 2019-11-07 DIAGNOSIS — N390 Urinary tract infection, site not specified: Secondary | ICD-10-CM | POA: Diagnosis not present

## 2019-11-07 NOTE — Progress Notes (Signed)
Error.  Appt moved to 11/07/19.

## 2019-11-07 NOTE — Progress Notes (Signed)
Virtual Visit via Video Note  I connected with Lauren Boyle on 11/07/19 at  3:30 PM EDT by a video enabled telemedicine application 2/2 COVID-19 pandemic and verified that I am speaking with the correct person using two identifiers.  Location patient: home Location provider:work or home office Persons participating in the virtual visit: patient, provider  I discussed the limitations of evaluation and management by telemedicine and the availability of in person appointments. The patient expressed understanding and agreed to proceed.   HPI: Pt is a 54 yo with pmh sig for OA, history of right adrenal mass.   Pt having "issues with stomach".  She was seen by Dr. Christella Hartigan, GI she was dx'd with H. Pylori.  She has taken meds for it, but notes her symptoms have become worse.  Pt has had to leave work 2/2 n/v and abdominal pain.  Pt seen at [facility] on Sunday, given macrobid for a UTI and medication for nausea which is not helping.  Pt states she was asymptomatic.  Ibuprofen makes pt's stomach upset.  Pt had her fist COVID shot and has the 2nd next wk.  ROS: See pertinent positives and negatives per HPI.  Past Medical History:  Diagnosis Date  . Arthritis   . Chicken pox   . Depression   . History of meniscal tear    left  . Iron deficiency   . Osteoarthritis of knee    left    Past Surgical History:  Procedure Laterality Date  . ABDOMINAL HYSTERECTOMY     partial  . CHONDROPLASTY Left 09/17/2016   Procedure: CHONDROPLASTY;  Surgeon: Frederico Hamman, MD;  Location: [facility];  Service: Orthopedics;  Laterality: Left;  . COLONOSCOPY    . KNEE ARTHROSCOPY WITH MEDIAL MENISECTOMY Left 09/17/2016   Procedure: KNEE ARTHROSCOPY WITH PARTIAL MEDIAL MENISECTOMY;  Surgeon: Frederico Hamman, MD;  Location: [facility];  Service: Orthopedics;  Laterality: Left;  . ORIF ULNAR FRACTURE Left 04/14/2015   Procedure: OPEN REDUCTION INTERNAL FIXATION (ORIF) ULNAR FRACTURE;   Surgeon: Eldred Manges, MD;  Location: [facility];  Service: Orthopedics;  Laterality: Left;  . TOTAL KNEE ARTHROPLASTY Left 05/13/2017   Procedure: LEFT TOTAL KNEE ARTHROPLASTY;  Surgeon: Frederico Hamman, MD;  [facility];  Service: Orthopedics;  Laterality: Left;    Family History  Problem Relation Age of Onset  . Hyperlipidemia Mother   . Hypertension Mother   . Hypertension Father   . Hyperlipidemia Father   . Diabetes Father   . Cancer Sister        ovarian  . Heart attack Neg Hx   . Sudden death Neg Hx      Current Outpatient Medications:  .  bismuth subsalicylate (PEPTO-BISMOL) 262 MG chewable tablet, Chew 2 tablets (524 mg total) by mouth 4 (four) times daily., Disp: 80 tablet, Rfl: 0 .  doxycycline (VIBRAMYCIN) 100 MG capsule, Take 1 capsule (100 mg total) by mouth 2 (two) times daily., Disp: 20 capsule, Rfl: 0 .  fluticasone (FLONASE) 50 MCG/ACT nasal spray, PLACE 1 SPRAY INTO BOTH NOSTRILS DAILY., Disp: 16 mL, Rfl: 0 .  metroNIDAZOLE (FLAGYL) 250 MG tablet, Take 1 tablet (250 mg total) by mouth 4 (four) times daily., Disp: 40 tablet, Rfl: 0 .  omeprazole (PRILOSEC) 20 MG capsule, Take 1 capsule (20 mg total) by mouth 2 (two) times daily before a meal., Disp: 20 capsule, Rfl: 0 .  pantoprazole (PROTONIX) 40 MG tablet, TAKE 1 TABLET BY MOUTH EVERY DAY, Disp:  90 tablet, Rfl: 0 .  bismuth-metronidazole-tetracycline (PYLERA) 140-125-125 MG capsule, Take 3 capsules by mouth 4 (four) times daily -  before meals and at bedtime for 10 days., Disp: 120 capsule, Rfl: 0  EXAM:  VITALS per patient if applicable: RR between 27-25 bpm  GENERAL: alert, oriented, appears well and in no acute distress  HEENT: atraumatic, conjunctiva clear, no obvious abnormalities on inspection of external nose and ears  NECK: normal movements of the head and neck  LUNGS: on inspection no signs of respiratory distress, breathing rate appears normal, no obvious gross SOB, gasping or wheezing  CV: no  obvious cyanosis  MS: moves all visible extremities without noticeable abnormality  PSYCH/NEURO: pleasant and cooperative, no obvious depression or anxiety, speech and thought processing grossly intact  ASSESSMENT AND PLAN:  Discussed the following assessment and plan:  Epigastric pain -Increasing -likely related to h/o H. Pylori infection.  Possibly 2/2 ulcer formation. -continue Zofran and PPI -Discussed bland diet and small portions -Follow-up with GI  H. pylori infection -s/p triple therapy -restart PPI.  -Pt advised to contact GI for further recommendations as symptoms remain and increasing.  Acute UTI -noted during UC visit on 11/04/19 -continue Macrobid -increase po hydration -discussed UTI prevention -given precautions  F/u prn   I discussed the assessment and treatment plan with the patient. The patient was provided an opportunity to ask questions and all were answered. The patient agreed with the plan and demonstrated an understanding of the instructions.   The patient was advised to call back or seek an in-person evaluation if the symptoms worsen or if the condition fails to improve as anticipated.  I provided 20 minutes of non-face-to-face time during this encounter.   Billie Ruddy, MD

## 2019-11-12 ENCOUNTER — Telehealth: Payer: Self-pay | Admitting: *Deleted

## 2019-11-12 NOTE — Telephone Encounter (Signed)
Patient called after hours line on 11/10/2019. Patient reports she needs a refill on her droxacilian and two other medications that she is unsure of.

## 2019-11-12 NOTE — Telephone Encounter (Signed)
Spoke with pt advised to call the GI office for advise if pt needs a f/u visit, pt verbalized understanding

## 2020-04-02 ENCOUNTER — Other Ambulatory Visit: Payer: Self-pay

## 2020-04-02 ENCOUNTER — Ambulatory Visit (HOSPITAL_COMMUNITY)
Admission: EM | Admit: 2020-04-02 | Discharge: 2020-04-02 | Disposition: A | Discharge status: Home / Self Care | Payer: Managed Care, Other (non HMO) | Attending: Family Medicine | Admitting: Family Medicine

## 2020-04-02 ENCOUNTER — Encounter (HOSPITAL_COMMUNITY): Payer: Self-pay | Admitting: Family Medicine

## 2020-04-02 DIAGNOSIS — Z20822 Contact with and (suspected) exposure to covid-19: Secondary | ICD-10-CM | POA: Diagnosis not present

## 2020-04-02 DIAGNOSIS — J069 Acute upper respiratory infection, unspecified: Secondary | ICD-10-CM | POA: Diagnosis not present

## 2020-04-02 MED ORDER — ONDANSETRON 4 MG PO TBDP: 4.0000 mg | ORAL_TABLET | Freq: Three times a day (TID) | ORAL | 0 refills | Status: DC | PRN

## 2020-04-02 MED ORDER — PREDNISONE 10 MG PO TABS: ORAL_TABLET | ORAL | 0 refills | Status: DC

## 2020-04-02 MED ORDER — PROMETHAZINE-DM 6.25-15 MG/5ML PO SYRP: 2.5000 mL | ORAL_SOLUTION | Freq: Four times a day (QID) | ORAL | 0 refills | Status: DC | PRN

## 2020-04-02 NOTE — ED Triage Notes (Signed)
Pt presents with sore throat, non productive cough, congestion , chest discomfort with coughing, and shortness of breath since yesterday.

## 2020-04-02 NOTE — ED Provider Notes (Signed)
[facility]    CSN: [id number] Arrival date & time: 04/02/20  1347      History   Chief Complaint Chief Complaint  Patient presents with  . Sore Throat  . Cough  . Chest Discomfort  . Shortness of Breath    HPI Lauren Boyle is a 54 y.o. female.   1 day history of SOB, chest tightness, sore throat, hoarseness, cough, malaise, vomiting, diarrhea. Denies known fever, diaphoresis, rashes, abdominal pain. Taking cough syrups without much relief. Also tried imodium which may have helped slow things down. No known sick contacts. UTD on covid vaccines.      Past Medical History:  Diagnosis Date  . Arthritis   . Chicken pox   . Depression   . History of meniscal tear    left  . Iron deficiency   . Osteoarthritis of knee    left    Patient Active Problem List   Diagnosis Date Noted  . Primary localized osteoarthritis of left knee 05/13/2017  . Adrenal mass, right (HCC) 05/27/2014  . Low back pain 07/04/2013    Past Surgical History:  Procedure Laterality Date  . ABDOMINAL HYSTERECTOMY     partial  . CHONDROPLASTY Left 09/17/2016   Procedure: CHONDROPLASTY;  Surgeon: Frederico Hamman, MD;  Location: [facility];  Service: Orthopedics;  Laterality: Left;  . COLONOSCOPY    . KNEE ARTHROSCOPY WITH MEDIAL MENISECTOMY Left 09/17/2016   Procedure: KNEE ARTHROSCOPY WITH PARTIAL MEDIAL MENISECTOMY;  Surgeon: Frederico Hamman, MD;  Location: [facility];  Service: Orthopedics;  Laterality: Left;  . ORIF ULNAR FRACTURE Left 04/14/2015   Procedure: OPEN REDUCTION INTERNAL FIXATION (ORIF) ULNAR FRACTURE;  Surgeon: Eldred Manges, MD;  Location: [facility];  Service: Orthopedics;  Laterality: Left;  . TOTAL KNEE ARTHROPLASTY Left 05/13/2017   Procedure: LEFT TOTAL KNEE ARTHROPLASTY;  Surgeon: Frederico Hamman, MD;  Location: [facility];  Service: Orthopedics;  Laterality: Left;    OB History   No obstetric history on file.      Home Medications     Prior to Admission medications   Medication Sig Start Date End Date Taking? Authorizing Provider  bismuth subsalicylate (PEPTO-BISMOL) 262 MG chewable tablet Chew 2 tablets (524 mg total) by mouth 4 (four) times daily. 08/14/19   Rachael Fee, MD  bismuth-metronidazole-tetracycline [facility]) [phone] MG capsule Take 3 capsules by mouth 4 (four) times daily -  before meals and at bedtime for 10 days. 08/13/19 08/23/19  Rachael Fee, MD  doxycycline (VIBRAMYCIN) 100 MG capsule Take 1 capsule (100 mg total) by mouth 2 (two) times daily. 08/14/19   Rachael Fee, MD  fluticasone (FLONASE) 50 MCG/ACT nasal spray PLACE 1 SPRAY INTO BOTH NOSTRILS DAILY. 08/08/19   Deeann Saint, MD  metroNIDAZOLE (FLAGYL) 250 MG tablet Take 1 tablet (250 mg total) by mouth 4 (four) times daily. 08/14/19   Rachael Fee, MD  omeprazole (PRILOSEC) 20 MG capsule Take 1 capsule (20 mg total) by mouth 2 (two) times daily before a meal. 08/14/19   Rachael Fee, MD  ondansetron (ZOFRAN ODT) 4 MG disintegrating tablet Take 1 tablet (4 mg total) by mouth every 8 (eight) hours as needed for nausea or vomiting. 04/02/20   Particia Nearing, PA-C  pantoprazole (PROTONIX) 40 MG tablet TAKE 1 TABLET BY MOUTH EVERY DAY 09/17/19   Deeann Saint, MD  predniSONE (DELTASONE) 10 MG tablet Take 6 tabs day one, 5 tabs day two, 4 tabs  day three, etc 04/02/20   Particia Nearing, PA-C  promethazine-dextromethorphan (PROMETHAZINE-DM) 6.25-15 MG/5ML syrup Take 2.5 mLs by mouth 4 (four) times daily as needed for cough. 04/02/20   Particia Nearing, PA-C    Family History Family History  Problem Relation Age of Onset  . Hyperlipidemia Mother   . Hypertension Mother   . Hypertension Father   . Hyperlipidemia Father   . Diabetes Father   . Cancer Sister        ovarian  . Heart attack Neg Hx   . Sudden death Neg Hx     Social History Social History   Tobacco Use  . Smoking status: Current Some Day Smoker     Packs/day: 0.25    Types: Cigarettes  . Smokeless tobacco: Never Used  Vaping Use  . Vaping Use: Former  Substance Use Topics  . Alcohol use: Yes    Alcohol/week: 7.0 standard drinks    Types: 7 Glasses of wine per week    Comment: social  . Drug use: No     Allergies   Penicillins   Review of Systems Review of Systems PER HPI   Physical Exam Triage Vital Signs ED Triage Vitals  Enc Vitals Group     BP 04/02/20 1558 (!) 144/88     Pulse Rate 04/02/20 1558 60     Resp 04/02/20 1558 17     Temp 04/02/20 1558 98.7 F (37.1 C)     Temp Source 04/02/20 1558 Oral     SpO2 04/02/20 1558 99 %     Weight --      Height --      Head Circumference --      Peak Flow --      Pain Score 04/02/20 1554 7     Pain Loc --      Pain Edu? --      Excl. in GC? --    No data found.  Updated Vital Signs BP (!) 144/88 (BP Location: Right Arm)   Pulse 60   Temp 98.7 F (37.1 C) (Oral)   Resp 17   SpO2 99%   Visual Acuity Right Eye Distance:   Left Eye Distance:   Bilateral Distance:    Right Eye Near:   Left Eye Near:    Bilateral Near:     Physical Exam Vitals and nursing note reviewed.  Constitutional:      Appearance: Normal appearance.  HENT:     Head: Atraumatic.     Right Ear: Tympanic membrane and external ear normal.     Left Ear: Tympanic membrane and external ear normal.     Nose: Rhinorrhea present.     Mouth/Throat:     Mouth: Mucous membranes are moist.     Pharynx: Posterior oropharyngeal erythema present.  Eyes:     General:        Right eye: No discharge.        Left eye: No discharge.     Extraocular Movements: Extraocular movements intact.     Conjunctiva/sclera: Conjunctivae normal.  Cardiovascular:     Rate and Rhythm: Normal rate and regular rhythm.     Heart sounds: Normal heart sounds.  Pulmonary:     Effort: Pulmonary effort is normal.     Breath sounds: Wheezing (moderate diffuse) present.  Abdominal:     General: Bowel sounds are  normal. There is no distension.     Palpations: Abdomen is soft.     Tenderness:  There is no abdominal tenderness. There is no guarding.  Musculoskeletal:        General: Normal range of motion.     Cervical back: Normal range of motion and neck supple.  Skin:    General: Skin is warm and dry.  Neurological:     Mental Status: She is alert and oriented to person, place, and time.  Psychiatric:        Mood and Affect: Mood normal.        Thought Content: Thought content normal.    UC Treatments / Results  Labs (all labs ordered are listed, but only abnormal results are displayed) Labs Reviewed  SARS CORONAVIRUS 2 (TAT 6-24 HRS)    EKG   Radiology No results found.  Procedures Procedures (including critical care time)  Medications Ordered in UC Medications - No data to display  Initial Impression / Assessment and Plan / UC Course  I have reviewed the triage vital signs and the nursing notes.  Pertinent labs & imaging results that were available during my care of the patient were reviewed by me and considered in my medical decision making (see chart for details).     Suspicious for COVID, await test results and discussed isolation protocol at length. Work note given while results pending. Given significant wheezes on exam and her subjective chest tightness, will start prednisone taper additionally. Zofran rx'd for N/V, phenergan DM for persistent cough. Strict return precautions reviewed with patient.  Final Clinical Impressions(s) / UC Diagnoses   Final diagnoses:  Viral URI with cough   Discharge Instructions   None    ED Prescriptions    Medication Sig Dispense Auth. Provider   predniSONE (DELTASONE) 10 MG tablet Take 6 tabs day one, 5 tabs day two, 4 tabs day three, etc 21 tablet Particia Nearing, PA-C   promethazine-dextromethorphan (PROMETHAZINE-DM) 6.25-15 MG/5ML syrup Take 2.5 mLs by mouth 4 (four) times daily as needed for cough. 118 mL Particia Nearing, PA-C   ondansetron (ZOFRAN ODT) 4 MG disintegrating tablet Take 1 tablet (4 mg total) by mouth every 8 (eight) hours as needed for nausea or vomiting. 21 tablet Particia Nearing, [place]     PDMP not reviewed this encounter.   Particia Nearing, [place] 04/02/20 1646

## 2020-04-03 LAB — SARS CORONAVIRUS 2 (TAT 6-24 HRS): SARS Coronavirus 2: NEGATIVE

## 2020-08-20 ENCOUNTER — Ambulatory Visit
Admission: EM | Admit: 2020-08-20 | Discharge: 2020-08-20 | Disposition: A | Discharge status: Home / Self Care | Payer: Managed Care, Other (non HMO) | Attending: Urgent Care | Admitting: Urgent Care

## 2020-08-20 ENCOUNTER — Other Ambulatory Visit: Payer: Self-pay

## 2020-08-20 DIAGNOSIS — S39012A Strain of muscle, fascia and tendon of lower back, initial encounter: Secondary | ICD-10-CM

## 2020-08-20 DIAGNOSIS — M545 Low back pain, unspecified: Secondary | ICD-10-CM | POA: Diagnosis not present

## 2020-08-20 DIAGNOSIS — R319 Hematuria, unspecified: Secondary | ICD-10-CM

## 2020-08-20 HISTORY — DX: Obesity, unspecified: E66.9

## 2020-08-20 LAB — POCT URINALYSIS DIP (MANUAL ENTRY)
Bilirubin, UA: NEGATIVE
Glucose, UA: NEGATIVE mg/dL
Ketones, POC UA: NEGATIVE mg/dL
Leukocytes, UA: NEGATIVE
Nitrite, UA: NEGATIVE
Protein Ur, POC: NEGATIVE mg/dL
Spec Grav, UA: 1.025 (ref 1.010–1.025)
Urobilinogen, UA: 0.2 E.U./dL
pH, UA: 6 (ref 5.0–8.0)

## 2020-08-20 MED ORDER — NAPROXEN 500 MG PO TABS: 500.0000 mg | ORAL_TABLET | Freq: Two times a day (BID) | ORAL | 0 refills | Status: DC

## 2020-08-20 MED ORDER — TIZANIDINE HCL 4 MG PO TABS: 4.0000 mg | ORAL_TABLET | Freq: Every evening | ORAL | 0 refills | Status: DC | PRN

## 2020-08-20 NOTE — ED Triage Notes (Signed)
Pt is here with back pain that started after getting off of work on Monday, pt has taken OTC meds to relieve discomfort.

## 2020-08-20 NOTE — Discharge Instructions (Signed)
Please make sure that you are hydrating very well with at least 2 liters of water a day.  This will help keep your back muscles happy and hydrated.  He can use naproxen for pain and inflammation, tizanidine as a muscle relaxant.  Make sure you are offloading your back as much as possible including using your legs to left, pivot when you are rotating.  Follow-up with your regular doctor regarding blood in your urine.  This may represent a kidney stone but will need a recheck with your PCP.  No sign of kidney infection today.

## 2020-08-20 NOTE — ED Provider Notes (Signed)
[facility]   MRN: [id number] DOB: 11-07-1965  Subjective:   Lauren Boyle is a 55 y.o. female presenting for 3-day history of persistent mid to right-sided low back pain.  Patient was at work, states that she lifted a very heavy item awkwardly and now she hurt herself then.  Would like to make sure she does not have a kidney infection.  Denies fever, nausea, vomiting, dysuria, urinary frequency, weakness, radicular symptoms.  No current facility-administered medications for this encounter. No current outpatient medications on file.   Allergies  Allergen Reactions  . Penicillins Itching    Patient was able to tolerate a penicillin shot on 01/22/18 at urgent care.  Patient states she had "severe itching" reaction to shot of Bicillin.  No difficulty breathing, no throat swelling.  Reaction occurred within 30 minutes to an hour.     Past Medical History:  Diagnosis Date  . Arthritis   . Chicken pox   . Depression   . History of meniscal tear    left  . Iron deficiency   . Obesity   . Osteoarthritis of knee    left     Past Surgical History:  Procedure Laterality Date  . ABDOMINAL HYSTERECTOMY     partial  . CHONDROPLASTY Left 09/17/2016   Procedure: CHONDROPLASTY;  Surgeon: Frederico Hamman, MD;  Location: [facility];  Service: Orthopedics;  Laterality: Left;  . COLONOSCOPY    . KNEE ARTHROSCOPY WITH MEDIAL MENISECTOMY Left 09/17/2016   Procedure: KNEE ARTHROSCOPY WITH PARTIAL MEDIAL MENISECTOMY;  Surgeon: Frederico Hamman, MD;  Location: [facility];  Service: Orthopedics;  Laterality: Left;  . ORIF ULNAR FRACTURE Left 04/14/2015   Procedure: OPEN REDUCTION INTERNAL FIXATION (ORIF) ULNAR FRACTURE;  Surgeon: Eldred Manges, MD;  Location: [facility] OR;  Service: Orthopedics;  Laterality: Left;  . TOTAL KNEE ARTHROPLASTY Left 05/13/2017   Procedure: LEFT TOTAL KNEE ARTHROPLASTY;  Surgeon: Frederico Hamman, MD;  Location: [facility] OR;  Service:  Orthopedics;  Laterality: Left;    Family History  Problem Relation Age of Onset  . Hyperlipidemia Mother   . Hypertension Mother   . Hypertension Father   . Hyperlipidemia Father   . Diabetes Father   . Cancer Sister        ovarian  . Heart attack Neg Hx   . Sudden death Neg Hx     Social History   Tobacco Use  . Smoking status: Current Some Day Smoker    Packs/day: 0.25    Types: Cigarettes  . Smokeless tobacco: Never Used  Vaping Use  . Vaping Use: Former  Substance Use Topics  . Alcohol use: Yes    Alcohol/week: 7.0 standard drinks    Types: 7 Glasses of wine per week  . Drug use: No    ROS   Objective:   Vitals: BP 128/83 (BP Location: Right Arm)   Pulse 62   Temp 97.8 F (36.6 C) (Oral)   Resp 17   SpO2 97%   Physical Exam Constitutional:      General: She is not in acute distress.    Appearance: Normal appearance. She is well-developed. She is obese. She is not ill-appearing, toxic-appearing or diaphoretic.  HENT:     Head: Normocephalic and atraumatic.     Nose: Nose normal.     Mouth/Throat:     Mouth: Mucous membranes are moist.     Pharynx: Oropharynx is clear.  Eyes:     General: No scleral icterus.  Right eye: No discharge.        Left eye: No discharge.     Extraocular Movements: Extraocular movements intact.     Conjunctiva/sclera: Conjunctivae normal.     Pupils: Pupils are equal, round, and reactive to light.  Cardiovascular:     Rate and Rhythm: Normal rate.  Pulmonary:     Effort: Pulmonary effort is normal.  Musculoskeletal:     Lumbar back: Spasms and tenderness (over areas outlined) present. No swelling, edema, deformity, signs of trauma, lacerations or bony tenderness. Normal range of motion. Negative right straight leg raise test and negative left straight leg raise test. No scoliosis.       Back:  Skin:    General: Skin is warm and dry.  Neurological:     General: No focal deficit present.     Mental Status: She  is alert and oriented to person, place, and time.     Motor: No weakness.     Coordination: Coordination normal.     Gait: Gait normal.     Deep Tendon Reflexes: Reflexes normal.  Psychiatric:        Mood and Affect: Mood normal.        Behavior: Behavior normal.        Thought Content: Thought content normal.        Judgment: Judgment normal.     Results for orders placed or performed during the hospital encounter of 08/20/20 (from the past 24 hour(s))  POCT urinalysis dipstick     Status: Abnormal   Collection Time: 08/20/20  1:48 PM  Result Value Ref Range   Color, UA yellow yellow   Clarity, UA clear clear   Glucose, UA negative negative mg/dL   Bilirubin, UA negative negative   Ketones, POC UA negative negative mg/dL   Spec Grav, UA 9.509 3.267 - 1.025   Blood, UA moderate (A) negative   pH, UA 6.0 5.0 - 8.0   Protein Ur, POC negative negative mg/dL   Urobilinogen, UA 0.2 0.2 or 1.0 E.U./dL   Nitrite, UA Negative Negative   Leukocytes, UA Negative Negative    Assessment and Plan :   PDMP not reviewed this encounter.  1. Acute right-sided low back pain without sciatica   2. Lumbar strain, initial encounter   3. Hematuria, unspecified type     Will manage conservatively for back strain with NSAID and muscle relaxant, rest and modification of physical activity.  Anticipatory guidance provided.  Follow up with PCP regarding hematuria, counseled on possibility of renal colic but should have a recheck with PCP. Counseled patient on potential for adverse effects with medications prescribed/recommended today, ER and return-to-clinic precautions discussed, patient verbalized understanding.    Wallis Bamberg, PA-C 08/20/20 1401

## 2020-09-22 ENCOUNTER — Ambulatory Visit (INDEPENDENT_AMBULATORY_CARE_PROVIDER_SITE_OTHER): Payer: Managed Care, Other (non HMO) | Admitting: Family Medicine

## 2020-09-22 ENCOUNTER — Ambulatory Visit: Payer: Self-pay

## 2020-09-22 ENCOUNTER — Other Ambulatory Visit: Payer: Self-pay

## 2020-09-22 VITALS — BP 108/70 | HR 73 | Ht 65.0 in | Wt 182.8 lb

## 2020-09-22 DIAGNOSIS — M79674 Pain in right toe(s): Secondary | ICD-10-CM

## 2020-09-22 DIAGNOSIS — G8929 Other chronic pain: Secondary | ICD-10-CM | POA: Diagnosis not present

## 2020-09-22 DIAGNOSIS — M2021 Hallux rigidus, right foot: Secondary | ICD-10-CM

## 2020-09-22 DIAGNOSIS — M21611 Bunion of right foot: Secondary | ICD-10-CM | POA: Diagnosis not present

## 2020-09-22 DIAGNOSIS — M79671 Pain in right foot: Secondary | ICD-10-CM

## 2020-09-22 LAB — BASIC METABOLIC PANEL
BUN: 13 mg/dL (ref 6–23)
CO2: 26 mEq/L (ref 19–32)
Calcium: 10.2 mg/dL (ref 8.4–10.5)
Chloride: 104 mEq/L (ref 96–112)
Creatinine, Ser: 0.86 mg/dL (ref 0.40–1.20)
GFR: 75.48 mL/min (ref >60.00)
Glucose, Bld: 97 mg/dL (ref 70–99)
Potassium: 4.4 mEq/L (ref 3.5–5.1)
Sodium: 138 mEq/L (ref 135–145)

## 2020-09-22 LAB — URIC ACID: Uric Acid, Serum: 4.7 mg/dL (ref 2.4–7.0)

## 2020-09-22 NOTE — Patient Instructions (Addendum)
Thank you for coming in today.  Please get an Xray today before you leave today.  Use a bunion shield.   Modify shoe or get a wider shoe (mens shoes will help).   Toe spacer may help.   Please use voltaren gel up to 4x daily for pain as needed.   Call or go to the ER if you develop a large red swollen joint with extreme pain or oozing puss.    Please get an Xray today before you leave  Please get labs today before you leave  Recheck in 6 weeks.

## 2020-09-22 NOTE — Progress Notes (Signed)
I, Lauren Boyle, LAT, ATC acting as a scribe for Lauren Graham, MD.  Subjective:    CC: Right foot pain  HPI: Pt is a 55 y/o female c/o R foot pain ongoing since 2021. MOI: Pt reports fractured toes 2-4 summer 2021. During the healing process, pt started experiencing pain along the medial aspect of first MTP. Pt locates pain is swelling at first MTP.  Additionally she has a little bit of pain and swelling at the fifth MTP.Marland Kitchen Pt has an apparent mass along medial aspect of R foot at 1st MTP joint. Pt was being seen by Dr. Elijah Birk, but was not happy w/ the care.  She has to wear steel toe boots at work  R foot swelling: yes Aggravating factors: any pressure, shoes Treatments tried: none  Pertinent review of Systems: No fevers or chills  Relevant historical information: Knee arthritis.   Objective:    Vitals:   09/22/20 1333  BP: 108/70  Pulse: 73  SpO2: 98%   General: Well Developed, well nourished, and in no acute distress.   MSK: Right foot: Bunion formation present.  First MTP slightly swollen.  Mild erythema medial aspect MTP.  Tender palpation. Decreased flexion and extension range of motion.  Tiny bunionette present lateral foot.  Not particular tender palpation dorsal or plantar forefoot except at first MTP.  Lab and Radiology Results Results for orders placed or performed in visit on 09/22/20 (from the past 72 hour(s))  Basic Metabolic Panel (BMET)     Status: None   Collection Time: 09/22/20  2:05 PM  Result Value Ref Range   Sodium 138 135 - 145 mEq/L   Potassium 4.4 3.5 - 5.1 mEq/L   Chloride 104 96 - 112 mEq/L   CO2 26 19 - 32 mEq/L   Glucose, Bld 97 70 - 99 mg/dL   BUN 13 6 - 23 mg/dL   Creatinine, Ser 5.09 0.40 - 1.20 mg/dL   GFR 32.67 >12.45 mL/min    Comment: Calculated using the CKD-EPI Creatinine Equation (2021)   Calcium 10.2 8.4 - 10.5 mg/dL  Uric acid     Status: None   Collection Time: 09/22/20  2:05 PM  Result Value Ref Range   Uric Acid, Serum  4.7 2.4 - 7.0 mg/dL   No results found.  X-ray right foot images ordered and will be completed at outside location  Procedure: Real-time Ultrasound Guided Injection of right first MTP Device: Philips Affiniti 50G Images permanently stored and available for review in PACS Verbal informed consent obtained.  Discussed risks and benefits of procedure. Warned about infection bleeding damage to structures skin hypopigmentation and fat atrophy among others. Patient expresses understanding and agreement Time-out conducted.   Noted no overlying erythema, induration, or other signs of local infection.   Skin prepped in a sterile fashion.   Local anesthesia: Topical Ethyl chloride.   With sterile technique and under real time ultrasound guidance:  40 mg of Depo-Medrol (0.99ml of 80mg /ml) and 0.5 mL of Marcaine injected into dorsal first MTP right. Fluid seen entering the joint capsule.   Completed without difficulty   Pain immediately resolved suggesting accurate placement of the medication.   Advised to call if fevers/chills, erythema, induration, drainage, or persistent bleeding.   Images permanently stored and available for review in the ultrasound unit.  Impression: Technically successful ultrasound guided injection.        Impression and Recommendations:    Assessment and Plan: 55 y.o. female with irritated bunion right  foot with some hallux rigidus.  Initially concerning for gout however labs were obtained and uric acid is 4.7 indicating gout is unlikely.  Discussed options.  First choice would be modification of footwear and purchasing larger toe box still toed boots.  Additionally recommend bunion padding and or toe spacers.  As above we will proceed with steroid injection and Voltaren gel.  If not significantly improved will refer to podiatry for surgical options.Marland Kitchen  PDMP not reviewed this encounter. Orders Placed This Encounter  Procedures  . [place] LIMITED JOINT SPACE STRUCTURES LOW  RIGHT(NO LINKED CHARGES)    Standing Status:   Future    Number of Occurrences:   1    Standing Expiration Date:   03/22/2021    Order Specific Question:   Reason for Exam (SYMPTOM  OR DIAGNOSIS REQUIRED)    Answer:   chronic right foot pain    Order Specific Question:   Preferred imaging location?    Answer:   [facility]  . DG Foot Complete Right    Standing Status:   Future    Standing Expiration Date:   09/22/2021    Order Specific Question:   Reason for Exam (SYMPTOM  OR DIAGNOSIS REQUIRED)    Answer:   chronic right foot pain    Order Specific Question:   Preferred imaging location?    Answer:   [facility]    Order Specific Question:   Is patient pregnant?    Answer:   No  . Uric acid    Standing Status:   Future    Number of Occurrences:   1    Standing Expiration Date:   09/22/2021  . Basic Metabolic Panel (BMET)    Standing Status:   Future    Number of Occurrences:   1    Standing Expiration Date:   09/22/2021   No orders of the defined types were placed in this encounter.   Discussed warning signs or symptoms. Please see discharge instructions. Patient expresses understanding.   The above documentation has been reviewed and is accurate and complete Lauren Boyle, M.D.

## 2020-09-23 DIAGNOSIS — M21611 Bunion of right foot: Secondary | ICD-10-CM | POA: Insufficient documentation

## 2020-09-23 NOTE — Progress Notes (Signed)
Kidney function and uric acid look normal.  I do not think that you have gout.

## 2020-10-01 ENCOUNTER — Ambulatory Visit: Payer: Managed Care, Other (non HMO) | Admitting: Family Medicine

## 2020-10-17 ENCOUNTER — Ambulatory Visit: Payer: Managed Care, Other (non HMO) | Admitting: Family Medicine

## 2020-10-22 ENCOUNTER — Ambulatory Visit: Payer: Managed Care, Other (non HMO) | Admitting: Family Medicine

## 2020-10-29 ENCOUNTER — Ambulatory Visit: Payer: Managed Care, Other (non HMO) | Admitting: Family Medicine

## 2020-10-29 DIAGNOSIS — Z0289 Encounter for other administrative examinations: Secondary | ICD-10-CM

## 2020-10-31 NOTE — Progress Notes (Deleted)
    Lauren Boyle is a 55 y.o. female who presents to [facility] today for R foot pain ongoing since 2021. During the healing process, pt started experiencing pain along the medial aspect of first MTP. Pt has to wear steel toe boots at work. Pt was last seen by Dr. Denyse Amass on 09/22/20 and was given a R 1st MTP depo-medrol injection and advised to modify footwear and use Voltaren gel. Today, pt reports    Pertinent review of systems: ***  Relevant historical information: ***   Exam:  There were no vitals taken for this visit. General: Well Developed, well nourished, and in no acute distress.   MSK: ***    Lab and Radiology Results No results found for this or any previous visit (from the past 72 hour(s)). No results found.     Assessment and Plan: 55 y.o. female with ***   PDMP not reviewed this encounter. No orders of the defined types were placed in this encounter.  No orders of the defined types were placed in this encounter.    Discussed warning signs or symptoms. Please see discharge instructions. Patient expresses understanding.   ***

## 2020-11-03 ENCOUNTER — Ambulatory Visit: Payer: Managed Care, Other (non HMO) | Admitting: Family Medicine

## 2021-08-28 ENCOUNTER — Ambulatory Visit (INDEPENDENT_AMBULATORY_CARE_PROVIDER_SITE_OTHER): Payer: Managed Care, Other (non HMO) | Admitting: Family Medicine

## 2021-08-28 ENCOUNTER — Encounter: Payer: Self-pay | Admitting: Family Medicine

## 2021-08-28 VITALS — BP 126/74 | HR 60 | Temp 98.6°F | Ht 65.0 in | Wt 193.4 lb

## 2021-08-28 DIAGNOSIS — R310 Gross hematuria: Secondary | ICD-10-CM

## 2021-08-28 DIAGNOSIS — K5909 Other constipation: Secondary | ICD-10-CM

## 2021-08-28 DIAGNOSIS — R1084 Generalized abdominal pain: Secondary | ICD-10-CM | POA: Diagnosis not present

## 2021-08-28 DIAGNOSIS — R350 Frequency of micturition: Secondary | ICD-10-CM

## 2021-08-28 DIAGNOSIS — M545 Low back pain, unspecified: Secondary | ICD-10-CM

## 2021-08-28 LAB — COMPREHENSIVE METABOLIC PANEL
ALT: 14 U/L (ref 0–35)
AST: 17 U/L (ref 0–37)
Albumin: 4.1 g/dL (ref 3.5–5.2)
Alkaline Phosphatase: 65 U/L (ref 39–117)
BUN: 13 mg/dL (ref 6–23)
CO2: 30 mEq/L (ref 19–32)
Calcium: 9.5 mg/dL (ref 8.4–10.5)
Chloride: 104 mEq/L (ref 96–112)
Creatinine, Ser: 0.8 mg/dL (ref 0.40–1.20)
GFR: 81.79 mL/min (ref >60.00)
Glucose, Bld: 80 mg/dL (ref 70–99)
Potassium: 4.1 mEq/L (ref 3.5–5.1)
Sodium: 139 mEq/L (ref 135–145)
Total Bilirubin: 0.6 mg/dL (ref 0.2–1.2)
Total Protein: 7.3 g/dL (ref 6.0–8.3)

## 2021-08-28 LAB — CBC WITH DIFFERENTIAL/PLATELET
Basophils Absolute: 0.1 10*3/uL (ref 0.0–0.1)
Basophils Relative: 1.1 % (ref 0.0–3.0)
Eosinophils Absolute: 0.3 10*3/uL (ref 0.0–0.7)
Eosinophils Relative: 5.3 % — ABNORMAL HIGH (ref 0.0–5.0)
HCT: 39 % (ref 36.0–46.0)
Hemoglobin: 12.6 g/dL (ref 12.0–15.0)
Lymphocytes Relative: 44.2 % (ref 12.0–46.0)
Lymphs Abs: 2.9 10*3/uL (ref 0.7–4.0)
MCHC: 32.4 g/dL (ref 30.0–36.0)
MCV: 87.1 fl (ref 78.0–100.0)
Monocytes Absolute: 0.6 10*3/uL (ref 0.1–1.0)
Monocytes Relative: 8.8 % (ref 3.0–12.0)
Neutro Abs: 2.7 10*3/uL (ref 1.4–7.7)
Neutrophils Relative %: 40.6 % — ABNORMAL LOW (ref 43.0–77.0)
Platelets: 456 10*3/uL — ABNORMAL HIGH (ref 150.0–400.0)
RBC: 4.48 Mil/uL (ref 3.87–5.11)
RDW: 14.5 % (ref 11.5–15.5)
WBC: 6.5 10*3/uL (ref 4.0–10.5)

## 2021-08-28 LAB — URINALYSIS, ROUTINE W REFLEX MICROSCOPIC
Bilirubin Urine: NEGATIVE
Ketones, ur: NEGATIVE
Leukocytes,Ua: NEGATIVE
Nitrite: POSITIVE — AB
Specific Gravity, Urine: 1.02 (ref 1.000–1.030)
Total Protein, Urine: NEGATIVE
Urine Glucose: NEGATIVE
Urobilinogen, UA: 0.2 (ref 0.0–1.0)
pH: 6 (ref 5.0–8.0)

## 2021-08-28 LAB — POCT URINALYSIS DIPSTICK
Bilirubin, UA: NEGATIVE
Blood, UA: 3
Glucose, UA: NEGATIVE
Ketone: NEGATIVE
Leukocytes, UA: NEGATIVE
Nitrite, UA: NEGATIVE
Protein, UA: NEGATIVE
Spec Grav, UA: 1.025 (ref 1.010–1.025)
Urobilinogen, UA: 0.2 E.U./dL
pH, UA: 6 (ref 5.0–8.0)

## 2021-08-28 LAB — LIPASE: Lipase: 10 U/L — ABNORMAL LOW (ref 11.0–59.0)

## 2021-08-28 MED ORDER — TRAMADOL HCL 50 MG PO TABS: 50.0000 mg | ORAL_TABLET | Freq: Three times a day (TID) | ORAL | 0 refills | Status: AC | PRN

## 2021-08-28 NOTE — Progress Notes (Signed)
Subjective:    Patient ID: Lauren Boyle, female    DOB: February 22, 1966, 56 y.o.   MRN: [id number]  Chief Complaint  Patient presents with   Back Pain    Started in lower part of stomach when bending over felt like couldn't get up    HPI Patient was seen today for acute concern.  Pt with hematuria, suprapubic pain that radiates to low back, pressure with urination, urinary frequency x 1.5 wks.  Pt also with chronic constipation.  Also had n/v last wk.  Tried OTC meds for constipation, but it did not provided relief.  Pt endorse abd bloating.  Trying to drink more water.  Pt states she bent over at work and her back "locked" for a few secs and she could not move.  Taking ibuprofen for the pain, but it is not helping.  Pt has a h/o abx resistant UTIs.  Past Medical History:  Diagnosis Date   Arthritis    Chicken pox    Depression    History of meniscal tear    left   Iron deficiency    Obesity    Osteoarthritis of knee    left    Allergies  Allergen Reactions   Penicillins Itching    Patient was able to tolerate a penicillin shot on 01/22/18 at urgent care.  Patient states she had "severe itching" reaction to shot of Bicillin.  No difficulty breathing, no throat swelling.  Reaction occurred within 30 minutes to an hour.     ROS General: Denies fever, chills, night sweats, changes in weight, changes in appetite HEENT: Denies headaches, ear pain, changes in vision, rhinorrhea, sore throat CV: Denies CP, palpitations, SOB, orthopnea Pulm: Denies SOB, cough, wheezing GI: Denies abdominal pain, nausea, vomiting, diarrhea +constipation GU: Denies dysuria, vaginal discharge +,hematuria, frequency Msk: Denies muscle cramps, joint pains +low back pain Neuro: Denies weakness, numbness, tingling Skin: Denies rashes, bruising Psych: Denies depression, anxiety, hallucinations    Objective:    Blood pressure 126/74, pulse 60, temperature 98.6 F (37 C), temperature source Oral, height  5\' 5"  (1.651 m), weight 193 lb 6.4 oz (87.7 kg), SpO2 98 %.  Gen. Pleasant, well-nourished, in no distress, normal affect   HEENT: [place]/AT, face symmetric, conjunctiva clear, no scleral icterus, PERRLA, EOMI, nares patent without drainage. Lungs: no accessory muscle use, CTAB, no wheezes or rales Cardiovascular: RRR, no m/r/g, no peripheral edema Abdomen: BS present, soft, TTP in ULQ and LLQ, ND, no hepatosplenomegaly. B/l CVA tenderness. Musculoskeletal: No deformities, no cyanosis or clubbing, normal tone Neuro:  A&Ox3, CN II-XII intact, normal gait Skin:  Warm, no lesions/ rash   Wt Readings from Last 3 Encounters:  08/28/21 193 lb 6.4 oz (87.7 kg)  09/22/20 182 lb 12.8 oz (82.9 kg)  11/06/19 185 lb (83.9 kg)    Lab Results  Component Value Date   WBC 7.3 01/23/2018   HGB 13.4 01/23/2018   HCT 42.9 01/23/2018   PLT 398 01/23/2018   GLUCOSE 97 09/22/2020   ALT 14 01/23/2018   AST 16 01/23/2018   NA 138 09/22/2020   K 4.4 09/22/2020   CL 104 09/22/2020   CREATININE 0.86 09/22/2020   BUN 13 09/22/2020   CO2 26 09/22/2020   INR 0.96 05/02/2017    Assessment/Plan:  Gross hematuria  - Plan: POCT urinalysis dipstick, Culture, Urine, Urinalysis with Reflex Microscopic, CT RENAL STONE STUDY  Acute bilateral low back pain without sciatica  - Plan: POCT urinalysis dipstick, CT RENAL STONE STUDY,  traMADol (ULTRAM) 50 MG tablet  Urinary frequency  - Plan: POCT urinalysis dipstick, CT RENAL STONE STUDY  Generalized abdominal pain  - Plan: CMP, Lipase, CBC with Differential/Platelet, CT RENAL STONE STUDY, traMADol (ULTRAM) 50 MG tablet  Chronic constipation  - Plan: CMP  Based on symptoms initial concern for UTI versus pyelonephritis.  POC UA with 3+ blood and SG 1.025.  No leuks noted.  Will obtain microscopic urinalysis and urine culture.  Will obtain labs and evaluate for renal calculi with CT stone study.  Given short course of tramadol and encouraged to increase po fluid  intake.  Given strict precautions.  Daily bowel regimen with miralax.  F/u prn  More than 50% of over 35-40 minutes spent in total in caring for this patient was spent face-to-face, reviewing the chart, counseling and/or coordinating care.    Grier Mitts, MD

## 2021-08-30 LAB — URINE CULTURE
MICRO NUMBER:: 12960979
SPECIMEN QUALITY:: ADEQUATE

## 2021-08-31 ENCOUNTER — Encounter: Payer: Self-pay | Admitting: Family Medicine

## 2021-08-31 ENCOUNTER — Other Ambulatory Visit: Payer: Self-pay | Admitting: Family Medicine

## 2021-08-31 DIAGNOSIS — N3001 Acute cystitis with hematuria: Secondary | ICD-10-CM

## 2021-08-31 MED ORDER — SULFAMETHOXAZOLE-TRIMETHOPRIM 800-160 MG PO TABS: 1.0000 | ORAL_TABLET | Freq: Two times a day (BID) | ORAL | 0 refills | Status: AC

## 2021-09-01 ENCOUNTER — Telehealth: Payer: Self-pay | Admitting: Family Medicine

## 2021-09-01 NOTE — Telephone Encounter (Signed)
LVM instructions for pt to call office to answer any questions she may have.

## 2021-09-01 NOTE — Telephone Encounter (Signed)
Result note given to pt. Pt had questions about her blood work. Explained to patient that values that are out of normal range are not of concern at this time. Pt verb understanding.

## 2021-09-01 NOTE — Telephone Encounter (Signed)
Pt is calling and would like someone to go over her blood work results she is not understanding the numbers

## 2021-09-02 ENCOUNTER — Telehealth: Payer: Self-pay | Admitting: Family Medicine

## 2021-09-02 ENCOUNTER — Ambulatory Visit (HOSPITAL_BASED_OUTPATIENT_CLINIC_OR_DEPARTMENT_OTHER): Admission: RE | Admit: 2021-09-02 | Payer: Managed Care, Other (non HMO) | Source: Ambulatory Visit

## 2021-09-02 NOTE — Telephone Encounter (Addendum)
[name] pre-service with [facility] is calling to obtain correct authorization for ct renal stone study. The exam is sch for [facility] location today 09-02-2021 and referral says [facility]. Alger Simons will call the patient and call the office back to verify . Sherri informed me that [facility] is not in patient network and pt will waiting for appt at [facility]. Pt is aware.

## 2022-02-16 DIAGNOSIS — Z1231 Encounter for screening mammogram for malignant neoplasm of breast: Secondary | ICD-10-CM

## 2022-02-17 NOTE — Telephone Encounter (Signed)
No documented mammogram on file. Pt states it been "a few years now".  Pt denies breast surgeries, hx of breast cancer, or current breast concerns. Pt prefers to be scheduled at [facility].  Appt scheduled for 02/24/22. Pt states she will see the appt in mychart.

## 2022-02-24 ENCOUNTER — Ambulatory Visit: Payer: Managed Care, Other (non HMO)

## 2022-03-03 ENCOUNTER — Encounter: Payer: Managed Care, Other (non HMO) | Admitting: Family Medicine

## 2022-03-09 ENCOUNTER — Ambulatory Visit: Payer: Managed Care, Other (non HMO)

## 2022-03-10 ENCOUNTER — Encounter: Payer: Managed Care, Other (non HMO) | Admitting: Family Medicine

## 2022-04-21 ENCOUNTER — Encounter: Payer: Self-pay | Admitting: Family Medicine

## 2022-04-21 ENCOUNTER — Ambulatory Visit (INDEPENDENT_AMBULATORY_CARE_PROVIDER_SITE_OTHER): Payer: Managed Care, Other (non HMO) | Admitting: Family Medicine

## 2022-04-21 VITALS — BP 128/80 | Temp 98.5°F | Resp 12 | Ht 65.0 in | Wt 197.0 lb

## 2022-04-21 DIAGNOSIS — Z23 Encounter for immunization: Secondary | ICD-10-CM

## 2022-04-21 DIAGNOSIS — R220 Localized swelling, mass and lump, head: Secondary | ICD-10-CM | POA: Diagnosis not present

## 2022-04-21 DIAGNOSIS — M542 Cervicalgia: Secondary | ICD-10-CM

## 2022-04-21 DIAGNOSIS — R519 Headache, unspecified: Secondary | ICD-10-CM

## 2022-04-21 MED ORDER — DOXYCYCLINE HYCLATE 100 MG PO TABS: 100.0000 mg | ORAL_TABLET | Freq: Two times a day (BID) | ORAL | 0 refills | Status: AC

## 2022-04-21 MED ORDER — TIZANIDINE HCL 4 MG PO TABS: 4.0000 mg | ORAL_TABLET | Freq: Every evening | ORAL | 0 refills | Status: DC | PRN

## 2022-04-21 NOTE — Progress Notes (Signed)
ACUTE VISIT Chief Complaint  Patient presents with   knot on head    Noticed around 8 months ago, started out small, now getting bigger & becoming painful; had sharp pains x 2 today while at work.    HPI: Ms.Deneane Breon is a 56 y.o. female, who is here today complaining of tender lesion under scalp skin noted a few months ago and for the past couple days very tender with light touch and described above. Problem is constant. Sometimes it seems bigger. She has not noted erythema or drainage. No hx of trauma. Negative for fever,chills,unusual fatigue, visual changes,or sore throat. Today she felt like pain was radiating to left retroocular area. She has not tried OTC treatments. Today at work her BP was checked and it was 109/80.  She is also c/o moderate occipital headache,intermittent for the past 1-2 months. No associated nausea,vomiting,or photophobia. Upper and lower back pain, both are chronic. She is requesting a refill for Zanaflex, which helps with back muscle spasms.  Review of Systems  Constitutional:  Negative for activity change and appetite change.  HENT:  Negative for mouth sores, nosebleeds and trouble swallowing.   Respiratory:  Negative for cough, shortness of breath and wheezing.   Gastrointestinal:  Negative for abdominal pain, nausea and vomiting.       Negative for changes in bowel habits.  Neurological:  Negative for syncope, facial asymmetry and weakness.  Hematological:  Negative for adenopathy. Does not bruise/bleed easily.  Psychiatric/Behavioral:  Negative for confusion. The patient is nervous/anxious.   Rest see pertinent positives and negatives per HPI.  Current Outpatient Medications on File Prior to Visit  Medication Sig Dispense Refill   [DISCONTINUED] bismuth-metronidazole-tetracycline (PYLERA) 140-125-125 MG capsule Take 3 capsules by mouth 4 (four) times daily -  before meals and at bedtime for 10 days. 120 capsule 0   [DISCONTINUED]  fluticasone (FLONASE) 50 MCG/ACT nasal spray PLACE 1 SPRAY INTO BOTH NOSTRILS DAILY. 16 mL 0   [DISCONTINUED] omeprazole (PRILOSEC) 20 MG capsule Take 1 capsule (20 mg total) by mouth 2 (two) times daily before a meal. 20 capsule 0   [DISCONTINUED] pantoprazole (PROTONIX) 40 MG tablet TAKE 1 TABLET BY MOUTH EVERY DAY 90 tablet 0   No current facility-administered medications on file prior to visit.     Past Medical History:  Diagnosis Date   Arthritis    Chicken pox    Depression    History of meniscal tear    left   Iron deficiency    Obesity    Osteoarthritis of knee    left   No Known Allergies   Social History   Socioeconomic History   Marital status: Married    Spouse name: Not on file   Number of children: Not on file   Years of education: Not on file   Highest education level: Associate degree: academic program  Occupational History   Not on file  Tobacco Use   Smoking status: Some Days    Packs/day: 0.25    Types: Cigarettes   Smokeless tobacco: Never  Vaping Use   Vaping Use: Former  Substance and Sexual Activity   Alcohol use: Yes    Alcohol/week: 7.0 standard drinks of alcohol    Types: 7 Glasses of wine per week   Drug use: No   Sexual activity: Not Currently    Birth control/protection: Surgical  Other Topics Concern   Not on file  Social History Narrative   Not on file  Social Determinants of Health   Financial Resource Strain: Low Risk  (04/21/2022)   Overall Financial Resource Strain (CARDIA)    Difficulty of Paying Living Expenses: Not very hard  Food Insecurity: No Food Insecurity (04/21/2022)   Hunger Vital Sign    Worried About Running Out of Food in the Last Year: Never true    Ran Out of Food in the Last Year: Never true  Transportation Needs: No Transportation Needs (04/21/2022)   PRAPARE - Hydrologist (Medical): No    Lack of Transportation (Non-Medical): No  Physical Activity: Insufficiently Active  (04/21/2022)   Exercise Vital Sign    Days of Exercise per Week: 2 days    Minutes of Exercise per Session: 30 min  Stress: Stress Concern Present (04/21/2022)   [place]    Feeling of Stress : To some extent  Social Connections: Moderately Integrated (04/21/2022)   Social Connection and Isolation Panel [NHANES]    Frequency of Communication with Friends and Family: Three times a week    Frequency of Social Gatherings with Friends and Family: Once a week    Attends Religious Services: 1 to 4 times per year    Active Member of [employer] or Organizations: No    Attends Archivist Meetings: Not on file    Marital Status: Married    Vitals:   04/21/22 1419  BP: 128/80  Resp: 12  Temp: 98.5 F (36.9 C)   Body mass index is 32.78 kg/m.  Physical Exam Vitals and nursing note reviewed.  Constitutional:      General: She is not in acute distress.    Appearance: She is well-developed.  HENT:     Head: Normocephalic and atraumatic.      Mouth/Throat:     Mouth: Mucous membranes are moist.     Pharynx: Oropharynx is clear.  Eyes:     Conjunctiva/sclera: Conjunctivae normal.  Cardiovascular:     Rate and Rhythm: Normal rate and regular rhythm.     Heart sounds: No murmur heard. Pulmonary:     Effort: Pulmonary effort is normal. No respiratory distress.     Breath sounds: Normal breath sounds.  Musculoskeletal:     Cervical back: Tenderness present. No edema, erythema or bony tenderness. Muscular tenderness present. Normal range of motion.       Back:  Lymphadenopathy:     Cervical: No cervical adenopathy.  Skin:    General: Skin is warm.     Findings: No rash.  Neurological:     General: No focal deficit present.     Mental Status: She is alert and oriented to person, place, and time.     Cranial Nerves: No cranial nerve deficit.  Psychiatric:        Mood and Affect: Mood is anxious.     Comments:  Well groomed, good eye contact.   ASSESSMENT AND PLAN:  Ms.Ryllie was seen today for knot on head.  Diagnoses and all orders for this visit:  Scalp mass Chronic and tender for the past 2 days. Hx and examination do not suggest a serious process , so I do not think she needs to be evaluated in the ED. Lesion could be a lipoma or sebaceous cyst. Because of pain and minimal erythema, I am recommending abx to treat possible cellulitis. If pain does not resolve, she may need lesion biopsied/excised. She was clearly instructed about warning signs. F/U in 2  weeks with PCP.  -     doxycycline (VIBRA-TABS) 100 MG tablet; Take 1 tablet (100 mg total) by mouth 2 (two) times daily for 7 days.  Occipital headache We discussed possible etiologies, ? Tension headache. Zanaflex may help.  Cervicalgia Local massage and ROM exercises. Zanaflex to be resumed daily at bedtime as needed.   -     tiZANidine (ZANAFLEX) 4 MG tablet; Take 1 tablet (4 mg total) by mouth at bedtime as needed for muscle spasms.  Need for influenza vaccination -     Flu Vaccine QUAD 4mo+IM (Fluarix, Fluzone & Alfiuria Quad PF)  Return in about 2 weeks (around 05/05/2022) for Scalp mass and occipital headache with PCP.Marland Kitchen  Harrington Jobe G. [place], MD  [facility]. [place] office.

## 2022-04-21 NOTE — Patient Instructions (Addendum)
A few things to remember from today's visit:  Scalp mass  Occipital headache  Cervicalgia - Plan: tiZANidine (ZANAFLEX) 4 MG tablet  Knot under your scalp could be a sebaceous cyst with mild infection, so I am sending antibiotic treatment. Monitor for new symptoms.  Headache in the back of your head seems to be tension headache. Zanaflex may help. Start a headache diary. Please arrange follow up appt with Dr Volanda Napoleon in 2 weeks.  Please be sure medication list is accurate. If a new problem present, please set up appointment sooner than planned today.

## 2022-06-04 ENCOUNTER — Ambulatory Visit (INDEPENDENT_AMBULATORY_CARE_PROVIDER_SITE_OTHER): Payer: Managed Care, Other (non HMO) | Admitting: Family Medicine

## 2022-06-04 VITALS — BP 124/78 | HR 76 | Temp 99.4°F | Wt 198.6 lb

## 2022-06-04 DIAGNOSIS — Z8711 Personal history of peptic ulcer disease: Secondary | ICD-10-CM

## 2022-06-04 DIAGNOSIS — M546 Pain in thoracic spine: Secondary | ICD-10-CM

## 2022-06-04 DIAGNOSIS — M62830 Muscle spasm of back: Secondary | ICD-10-CM

## 2022-06-04 DIAGNOSIS — M542 Cervicalgia: Secondary | ICD-10-CM

## 2022-06-04 DIAGNOSIS — R220 Localized swelling, mass and lump, head: Secondary | ICD-10-CM | POA: Diagnosis not present

## 2022-06-04 DIAGNOSIS — R519 Headache, unspecified: Secondary | ICD-10-CM

## 2022-06-04 DIAGNOSIS — R1310 Dysphagia, unspecified: Secondary | ICD-10-CM

## 2022-06-04 MED ORDER — PANTOPRAZOLE SODIUM 20 MG PO TBEC: 20.0000 mg | DELAYED_RELEASE_TABLET | Freq: Two times a day (BID) | ORAL | 3 refills | Status: DC

## 2022-06-04 MED ORDER — TIZANIDINE HCL 4 MG PO TABS: 4.0000 mg | ORAL_TABLET | Freq: Every evening | ORAL | 1 refills | Status: DC | PRN

## 2022-06-04 NOTE — Progress Notes (Unsigned)
Subjective:    Patient ID: Lauren Boyle, female    DOB: 1966/05/28, 56 y.o.   MRN: [id number]  Chief Complaint  Patient presents with   Follow-up    Back pain, feel like spasms. When bends over sometimes back will lock and can't move. Raised lesion on forehead, causing headaches, feels it when she laughs. Hithead at work under table on a pc of metal, started with little pains, and is getting bigger. Last month has started to have real bad headaches.     HPI Patient was seen today for follow-up and acute concern.   Pt endorses increased HAs x several wks.  Pt seen 04/21/22 by Dr. [place] for decreased focus after hitting head on a piece of metal at work.  Pt states she still has a bump in L scalp which causes discomfort.  Laughing, rushing (moving too quickly), or bending over causes sensation of increased pressure in head.  Sensation keeps pt up at night.  Past Medical History:  Diagnosis Date   Arthritis    Chicken pox    Depression    History of meniscal tear    left   Iron deficiency    Obesity    Osteoarthritis of knee    left    No Known Allergies  ROS General: Denies fever, chills, night sweats, changes in weight, changes in appetite HEENT: Denies headaches, ear pain, changes in vision, rhinorrhea, sore throat CV: Denies CP, palpitations, SOB, orthopnea Pulm: Denies SOB, cough, wheezing GI: Denies abdominal pain, nausea, vomiting, diarrhea, constipation GU: Denies dysuria, hematuria, frequency, vaginal discharge Msk: Denies muscle cramps, joint pains Neuro: Denies weakness, numbness, tingling Skin: Denies rashes, bruising Psych: Denies depression, anxiety, hallucinations      Objective:    There were no vitals taken for this visit.   Gen. Pleasant, well-nourished, in no distress, normal affect  *** HEENT: [place]/AT, face symmetric, conjunctiva clear, no scleral icterus, PERRLA, EOMI, nares patent without drainage, pharynx without erythema or exudate. Neck: No JVD,  no thyromegaly, no carotid bruits Lungs: no accessory muscle use, CTAB, no wheezes or rales Cardiovascular: RRR, no m/r/g, no ***peripheral edema Abdomen: BS present, soft, NT/ND, no hepatosplenomegaly. Musculoskeletal: No deformities, no cyanosis or clubbing, normal tone Neuro:  A&Ox3, CN II-XII intact, normal gait Skin:  Warm, no lesions/ rash   Wt Readings from Last 3 Encounters:  04/21/22 197 lb (89.4 kg)  08/28/21 193 lb 6.4 oz (87.7 kg)  09/22/20 182 lb 12.8 oz (82.9 kg)    Lab Results  Component Value Date   WBC 6.5 08/28/2021   HGB 12.6 08/28/2021   HCT 39.0 08/28/2021   PLT 456.0 (H) 08/28/2021   GLUCOSE 80 08/28/2021   ALT 14 08/28/2021   AST 17 08/28/2021   NA 139 08/28/2021   K 4.1 08/28/2021   CL 104 08/28/2021   CREATININE 0.80 08/28/2021   BUN 13 08/28/2021   CO2 30 08/28/2021   INR 0.96 05/02/2017    Assessment/Plan:  No diagnosis found.  F/u ***  Abbe Amsterdam, MD

## 2022-06-04 NOTE — Patient Instructions (Signed)
Referral was placed for you to see the dermatologist and the gastroenterologist.  You should receive a phone call about scheduling these appointments.  Prescriptions for Protonix 20 mg twice a day and Zanaflex were sent to your pharmacy.

## 2022-06-23 ENCOUNTER — Telehealth: Payer: Self-pay | Admitting: Family Medicine

## 2022-06-23 ENCOUNTER — Encounter: Payer: Self-pay | Admitting: Family Medicine

## 2022-06-23 NOTE — Telephone Encounter (Signed)
Pt has been taking 2 pill of tiZANidine (ZANAFLEX) 4 MG tablet  pills instead of one pill and would like a early refill  [id number] - [place], [place] - [address] Phone: [phone]  Fax: [phone]

## 2022-06-26 ENCOUNTER — Other Ambulatory Visit: Payer: Self-pay | Admitting: Family Medicine

## 2022-06-26 DIAGNOSIS — M62830 Muscle spasm of back: Secondary | ICD-10-CM

## 2022-06-26 DIAGNOSIS — M546 Pain in thoracic spine: Secondary | ICD-10-CM

## 2022-06-26 DIAGNOSIS — M542 Cervicalgia: Secondary | ICD-10-CM

## 2022-06-28 NOTE — Telephone Encounter (Signed)
Last OV 06/04/22 notes:  Return in about 4 weeks (around 07/02/2022), or if symptoms worsen or fail to improve.  Last refill per [facility] 06/04/22  Pt notified of above. Appt scheduled for 07/02/22. Pt also states she was taking 2 tablets/day when she didn't have to work.   Of note: pt does have appt for both referrals.

## 2022-07-01 NOTE — Telephone Encounter (Signed)
Pt had appt scheduled  

## 2022-07-02 ENCOUNTER — Ambulatory Visit (INDEPENDENT_AMBULATORY_CARE_PROVIDER_SITE_OTHER): Payer: Managed Care, Other (non HMO) | Admitting: Family Medicine

## 2022-07-02 VITALS — BP 142/96 | HR 67 | Temp 98.0°F | Wt 198.8 lb

## 2022-07-02 DIAGNOSIS — M545 Low back pain, unspecified: Secondary | ICD-10-CM | POA: Diagnosis not present

## 2022-07-02 DIAGNOSIS — J329 Chronic sinusitis, unspecified: Secondary | ICD-10-CM | POA: Diagnosis not present

## 2022-07-02 DIAGNOSIS — R030 Elevated blood-pressure reading, without diagnosis of hypertension: Secondary | ICD-10-CM

## 2022-07-02 DIAGNOSIS — G8929 Other chronic pain: Secondary | ICD-10-CM

## 2022-07-02 DIAGNOSIS — R1310 Dysphagia, unspecified: Secondary | ICD-10-CM

## 2022-07-02 DIAGNOSIS — R6882 Decreased libido: Secondary | ICD-10-CM

## 2022-07-02 MED ORDER — AMOXICILLIN-POT CLAVULANATE 875-125 MG PO TABS: 1.0000 | ORAL_TABLET | Freq: Two times a day (BID) | ORAL | 0 refills | Status: DC

## 2022-07-02 MED ORDER — OMEPRAZOLE 20 MG PO CPDR: 20.0000 mg | DELAYED_RELEASE_CAPSULE | Freq: Every day | ORAL | 3 refills | Status: DC

## 2022-07-02 MED ORDER — TIZANIDINE HCL 4 MG PO TABS: 8.0000 mg | ORAL_TABLET | Freq: Two times a day (BID) | ORAL | 0 refills | Status: DC | PRN

## 2022-07-02 NOTE — Progress Notes (Signed)
Subjective:    Patient ID: Lauren Boyle, female    DOB: 1966/05/18, 56 y.o.   MRN: [id number]  Chief Complaint  Patient presents with   Follow-up    On back pain. Reports she had a fall on Wednesday. Hurts her R arm. Noticed bruised.     HPI Patient was seen today for f/u.  Pt with continued back pain.  Worse in the last wk s/p fall at work.  Pt states she tripped on a mat and fell backwards landing on her right side.  Has been out of work since Wednesday.  Prior to this injury patient was feeling with low back pain.  Was taking 2 Zanaflex 4 mg tablets for relief.  States has been on muscle relaxers were not related.  Was also taking to 800 mg ibuprofen 3 times daily.  Stopped taking them as she started to feel bad.  Patient endorses continued dysphagia.  States feels like foods, pills, liquids get stuck midway down the esophagus.  Appointment with GI not until January 2024.  Patient also endorses thick mucus/congestion.  At times smells of bad odor.  Has a history of sinus procedure in [place] in 2005 or 2006.  No recent ENT appointments.  Patient notes decreased libido.  Had hysterectomy a few years ago.  Inquires if there is any medication that can help with symptoms.  Derm appointment for possible lipoma/cyst in scalp not until March 2024.  Still having head pressure/left eye pain. Past Medical History:  Diagnosis Date   Arthritis    Chicken pox    Depression    History of meniscal tear    left   Iron deficiency    Obesity    Osteoarthritis of knee    left    No Known Allergies  ROS General: Denies fever, chills, night sweats, changes in weight, changes in appetite HEENT: Denies headaches, ear pain, changes in vision, rhinorrhea, sore throat + nasal congestion with thick mucus, malodorous smell CV: Denies CP, palpitations, SOB, orthopnea Pulm: Denies SOB, cough, wheezing GI: Denies abdominal pain, nausea, vomiting, diarrhea, constipation  + dysphagia GU: Denies  dysuria, hematuria, frequency, vaginal discharge Msk: Denies muscle cramps, joint pains + low back pain, right arm pain Neuro: Denies weakness, numbness, tingling Skin: Denies rashes, bruising + ecchymosis right arm Psych: Denies depression, anxiety, hallucinations     Objective:    Blood pressure (!) 142/96, pulse 67, temperature 98 F (36.7 C), temperature source Oral, weight 198 lb 12.8 oz (90.2 kg), SpO2 96 %.  Gen. Pleasant, well-nourished, in no distress, normal affect   HEENT: /AT, face symmetric, conjunctiva clear, no scleral icterus, PERRLA, EOMI, nares patent without drainage Lungs: no accessory muscle use Cardiovascular: RRR,no peripheral edema Musculoskeletal: No deformities, no cyanosis or clubbing, normal tone Neuro:  A&Ox3, CN II-XII intact, normal gait Skin:  Warm, no lesions/ rash   Wt Readings from Last 3 Encounters:  07/02/22 198 lb 12.8 oz (90.2 kg)  06/04/22 198 lb 9.6 oz (90.1 kg)  04/21/22 197 lb (89.4 kg)    Lab Results  Component Value Date   WBC 6.5 08/28/2021   HGB 12.6 08/28/2021   HCT 39.0 08/28/2021   PLT 456.0 (H) 08/28/2021   GLUCOSE 80 08/28/2021   ALT 14 08/28/2021   AST 17 08/28/2021   NA 139 08/28/2021   K 4.1 08/28/2021   CL 104 08/28/2021   CREATININE 0.80 08/28/2021   BUN 13 08/28/2021   CO2 30 08/28/2021   INR 0.96  05/02/2017    Assessment/Plan:  Chronic sinusitis, unspecified location -Follow-up with ENT for continued or worsening symptoms. -OTC antihistamines as needed, saline nasal rinse -amoxicillin-clavulanate (AUGMENTIN) 875-125 MG tablet, referral to ENT  Dysphagia, unspecified type  -Will discontinue Protonix as patient does not feel like it is helping -Start omeprazole -Given continued symptoms advised to follow-up with GI.  Patient would like to wait until January 2024.   Referral was approved 06/04/2022. - Plan: omeprazole (PRILOSEC) 20 MG capsule  Chronic midline low back pain without sciatica  -Acute on  chronic symptoms recently aggravated by fall at work. -Continue supportive care -Follow-up with Ortho - Plan: tiZANidine (ZANAFLEX) 4 MG tablet  Elevated blood pressure reading in office without diagnosis of hypertension -monitor -lifestyle modifications  Decreased libido -discussed  likely 2/2 menopause -will look into medication options that may help with symptoms as affecting pt's relationship.  F/u prn  Abbe Amsterdam, MD

## 2022-07-14 ENCOUNTER — Encounter: Payer: Self-pay | Admitting: Family Medicine

## 2022-07-23 ENCOUNTER — Other Ambulatory Visit: Payer: Self-pay | Admitting: Family Medicine

## 2022-07-23 DIAGNOSIS — G8929 Other chronic pain: Secondary | ICD-10-CM

## 2022-07-23 DIAGNOSIS — M545 Low back pain, unspecified: Secondary | ICD-10-CM

## 2022-07-28 ENCOUNTER — Other Ambulatory Visit: Payer: Self-pay | Admitting: Family Medicine

## 2022-07-28 ENCOUNTER — Ambulatory Visit: Payer: Managed Care, Other (non HMO) | Admitting: Nurse Practitioner

## 2022-07-28 DIAGNOSIS — G8929 Other chronic pain: Secondary | ICD-10-CM

## 2022-07-29 ENCOUNTER — Encounter: Payer: Self-pay | Admitting: Family Medicine

## 2022-07-29 ENCOUNTER — Other Ambulatory Visit: Payer: Self-pay | Admitting: Family Medicine

## 2022-07-29 DIAGNOSIS — R6882 Decreased libido: Secondary | ICD-10-CM

## 2022-07-29 MED ORDER — BUPROPION HCL ER (SR) 150 MG PO TB12: 150.0000 mg | ORAL_TABLET | Freq: Every day | ORAL | 3 refills | Status: DC

## 2022-08-04 ENCOUNTER — Other Ambulatory Visit: Payer: Self-pay | Admitting: Family Medicine

## 2022-08-04 DIAGNOSIS — G8929 Other chronic pain: Secondary | ICD-10-CM

## 2022-08-25 ENCOUNTER — Emergency Department (HOSPITAL_BASED_OUTPATIENT_CLINIC_OR_DEPARTMENT_OTHER): Payer: Managed Care, Other (non HMO)

## 2022-08-25 ENCOUNTER — Emergency Department (HOSPITAL_BASED_OUTPATIENT_CLINIC_OR_DEPARTMENT_OTHER)
Admission: EM | Admit: 2022-08-25 | Discharge: 2022-08-25 | Disposition: A | Discharge status: Home / Self Care | Payer: Managed Care, Other (non HMO) | Attending: Emergency Medicine | Admitting: Emergency Medicine

## 2022-08-25 ENCOUNTER — Other Ambulatory Visit (HOSPITAL_BASED_OUTPATIENT_CLINIC_OR_DEPARTMENT_OTHER): Payer: Self-pay

## 2022-08-25 ENCOUNTER — Encounter (HOSPITAL_BASED_OUTPATIENT_CLINIC_OR_DEPARTMENT_OTHER): Payer: Self-pay | Admitting: Emergency Medicine

## 2022-08-25 ENCOUNTER — Other Ambulatory Visit: Payer: Self-pay

## 2022-08-25 DIAGNOSIS — J3489 Other specified disorders of nose and nasal sinuses: Secondary | ICD-10-CM | POA: Insufficient documentation

## 2022-08-25 DIAGNOSIS — R000 Tachycardia, unspecified: Secondary | ICD-10-CM | POA: Diagnosis not present

## 2022-08-25 DIAGNOSIS — Z1152 Encounter for screening for COVID-19: Secondary | ICD-10-CM | POA: Insufficient documentation

## 2022-08-25 DIAGNOSIS — R0602 Shortness of breath: Secondary | ICD-10-CM

## 2022-08-25 DIAGNOSIS — K219 Gastro-esophageal reflux disease without esophagitis: Secondary | ICD-10-CM | POA: Insufficient documentation

## 2022-08-25 DIAGNOSIS — R079 Chest pain, unspecified: Secondary | ICD-10-CM

## 2022-08-25 DIAGNOSIS — J984 Other disorders of lung: Secondary | ICD-10-CM | POA: Insufficient documentation

## 2022-08-25 LAB — RESP PANEL BY RT-PCR (RSV, FLU A&B, COVID)  RVPGX2
Influenza A by PCR: NEGATIVE
Influenza B by PCR: NEGATIVE
Resp Syncytial Virus by PCR: NEGATIVE
SARS Coronavirus 2 by RT PCR: NEGATIVE

## 2022-08-25 LAB — CBC WITH DIFFERENTIAL/PLATELET
Abs Immature Granulocytes: 0.19 10*3/uL — ABNORMAL HIGH (ref 0.00–0.07)
Basophils Absolute: 0.1 10*3/uL (ref 0.0–0.1)
Basophils Relative: 1 %
Eosinophils Absolute: 0.3 10*3/uL (ref 0.0–0.5)
Eosinophils Relative: 3 %
HCT: 40.2 % (ref 36.0–46.0)
Hemoglobin: 13.3 g/dL (ref 12.0–15.0)
Immature Granulocytes: 2 %
Lymphocytes Relative: 38 %
Lymphs Abs: 3.4 10*3/uL (ref 0.7–4.0)
MCH: 28.1 pg (ref 26.0–34.0)
MCHC: 33.1 g/dL (ref 30.0–36.0)
MCV: 85 fL (ref 80.0–100.0)
Monocytes Absolute: 1.3 10*3/uL — ABNORMAL HIGH (ref 0.1–1.0)
Monocytes Relative: 14 %
Neutro Abs: 3.8 10*3/uL (ref 1.7–7.7)
Neutrophils Relative %: 42 %
Platelets: 409 10*3/uL — ABNORMAL HIGH (ref 150–400)
RBC: 4.73 MIL/uL (ref 3.87–5.11)
RDW: 13.6 % (ref 11.5–15.5)
WBC: 9.1 10*3/uL (ref 4.0–10.5)
nRBC: 0 % (ref 0.0–0.2)

## 2022-08-25 LAB — COMPREHENSIVE METABOLIC PANEL
ALT: 15 U/L (ref 0–44)
AST: 17 U/L (ref 15–41)
Albumin: 3.8 g/dL (ref 3.5–5.0)
Alkaline Phosphatase: 72 U/L (ref 38–126)
Anion gap: 12 (ref 5–15)
BUN: 14 mg/dL (ref 6–20)
CO2: 21 mmol/L — ABNORMAL LOW (ref 22–32)
Calcium: 8.9 mg/dL (ref 8.9–10.3)
Chloride: 104 mmol/L (ref 98–111)
Creatinine, Ser: 0.97 mg/dL (ref 0.44–1.00)
GFR, Estimated: >60 mL/min (ref >60)
Glucose, Bld: 102 mg/dL — ABNORMAL HIGH (ref 70–99)
Potassium: 3.2 mmol/L — ABNORMAL LOW (ref 3.5–5.1)
Sodium: 137 mmol/L (ref 135–145)
Total Bilirubin: 0.7 mg/dL (ref 0.3–1.2)
Total Protein: 7.8 g/dL (ref 6.5–8.1)

## 2022-08-25 LAB — I-STAT VENOUS BLOOD GAS, ED
Acid-base deficit: 1 mmol/L (ref 0.0–2.0)
Bicarbonate: 22.5 mmol/L (ref 20.0–28.0)
Calcium, Ion: 1.18 mmol/L (ref 1.15–1.40)
HCT: 42 % (ref 36.0–46.0)
Hemoglobin: 14.3 g/dL (ref 12.0–15.0)
O2 Saturation: 93 %
Potassium: 3.3 mmol/L — ABNORMAL LOW (ref 3.5–5.1)
Sodium: 140 mmol/L (ref 135–145)
TCO2: 24 mmol/L (ref 22–32)
pCO2, Ven: 33.6 mmHg — ABNORMAL LOW (ref 44–60)
pH, Ven: 7.435 — ABNORMAL HIGH (ref 7.25–7.43)
pO2, Ven: 63 mmHg — ABNORMAL HIGH (ref 32–45)

## 2022-08-25 LAB — HCG, QUANTITATIVE, PREGNANCY: hCG, Beta Chain, Quant, S: 2 m[IU]/mL (ref <5)

## 2022-08-25 MED ORDER — SODIUM CHLORIDE 0.9 % IV BOLUS
1000.0000 mL | Freq: Once | INTRAVENOUS | Status: AC
  Administered 2022-08-25: 1000 mL via INTRAVENOUS

## 2022-08-25 MED ORDER — ALBUTEROL (5 MG/ML) CONTINUOUS INHALATION SOLN
10.0000 mg/h | INHALATION_SOLUTION | Freq: Once | RESPIRATORY_TRACT | Status: AC
  Administered 2022-08-25: 10 mg/h via RESPIRATORY_TRACT

## 2022-08-25 MED ORDER — KETOROLAC TROMETHAMINE 15 MG/ML IJ SOLN
15.0000 mg | Freq: Once | INTRAMUSCULAR | Status: AC
  Administered 2022-08-25: 15 mg via INTRAVENOUS
  Filled 2022-08-25: qty 1

## 2022-08-25 MED ORDER — METHYLPREDNISOLONE 4 MG PO TBPK: ORAL_TABLET | ORAL | 0 refills | Status: AC

## 2022-08-25 MED ORDER — MAGNESIUM SULFATE 2 GM/50ML IV SOLN
2.0000 g | Freq: Once | INTRAVENOUS | Status: AC
  Administered 2022-08-25: 2 g via INTRAVENOUS
  Filled 2022-08-25: qty 50

## 2022-08-25 MED ORDER — ALBUTEROL SULFATE HFA 108 (90 BASE) MCG/ACT IN AERS: 1.0000 | INHALATION_SPRAY | Freq: Four times a day (QID) | RESPIRATORY_TRACT | 0 refills | Status: DC | PRN

## 2022-08-25 MED ORDER — PANTOPRAZOLE SODIUM 40 MG IV SOLR
40.0000 mg | Freq: Once | INTRAVENOUS | Status: AC
  Administered 2022-08-25: 40 mg via INTRAVENOUS
  Filled 2022-08-25: qty 10

## 2022-08-25 MED ORDER — LIDOCAINE 5 % EX PTCH
1.0000 | MEDICATED_PATCH | CUTANEOUS | Status: DC
  Administered 2022-08-25: 1 via TRANSDERMAL
  Filled 2022-08-25: qty 1

## 2022-08-25 MED ORDER — LIDOCAINE 4 % EX PTCH
1.0000 | MEDICATED_PATCH | CUTANEOUS | 0 refills | Status: DC
  Filled 2022-08-25: qty 30, 30d supply, fill #0

## 2022-08-25 MED ORDER — RACEPINEPHRINE HCL 2.25 % IN NEBU
0.5000 mL | INHALATION_SOLUTION | Freq: Once | RESPIRATORY_TRACT | Status: AC
  Administered 2022-08-25: 0.5 mL via RESPIRATORY_TRACT
  Filled 2022-08-25: qty 0.5

## 2022-08-25 MED ORDER — ALBUTEROL (5 MG/ML) CONTINUOUS INHALATION SOLN
INHALATION_SOLUTION | RESPIRATORY_TRACT | Status: AC
  Filled 2022-08-25: qty 20

## 2022-08-25 MED ORDER — ALBUTEROL (5 MG/ML) CONTINUOUS INHALATION SOLN: 15.0000 mg/h | INHALATION_SOLUTION | Freq: Once | RESPIRATORY_TRACT | Status: DC

## 2022-08-25 MED ORDER — BENZONATATE 100 MG PO CAPS
100.0000 mg | ORAL_CAPSULE | Freq: Three times a day (TID) | ORAL | 0 refills | Status: DC
  Filled 2022-08-25: qty 21, 7d supply, fill #0

## 2022-08-25 MED ORDER — PANTOPRAZOLE SODIUM 40 MG PO TBEC: 40.0000 mg | DELAYED_RELEASE_TABLET | Freq: Two times a day (BID) | ORAL | 0 refills | Status: DC

## 2022-08-25 NOTE — Discharge Instructions (Addendum)
Believe that your shortness of breath is likely secondary to your uncontrolled acid reflux, we will increase your Protonix to 40 mg twice a day, and have used albuterol inhaler for shortness of breath as needed.  Please take the Medrol Dosepak as prescribed, and follow-up with your PCP.  If you have worsening shortness of breath, severe chest pain, please return to the ER.

## 2022-08-25 NOTE — ED Provider Notes (Signed)
[facility] Provider Note   CSN: [id number] Arrival date & time: 08/25/22  1022     History  Chief Complaint  Patient presents with   Wheezing    Aris Moman is a 57 y.o. female, hx of tobacco use, who presents to the ED secondary to shortness of breath and wheezing this a.m.  She states that she was trying to cough up stuff yesterday, and ended up coughing up and vomiting up large chunks of mucus.  Went to bed shortly after, and woke up with severe wheezing, shortness of breath.  No history of asthma, COPD.  Denies any fever, chills, abdominal pain, nausea.   Of note after further discussion, patient states that she has had problems with her acid reflux for months, currently on Prilosec once a day, but frequently vomits up her food, or chunks of mucus.  Also reports persistent sinus drainage/congestion.  She states that this is a recurrent issue, and is being sent to ENT for it.  States that she wants to vomit every time she eats food.  Was brought in by EMS, who gave 125 mg of Solu-Medrol, 2 DuoNebs, and 1 albuterol neb.     Home Medications Prior to Admission medications   Medication Sig Start Date End Date Taking? Authorizing Provider  albuterol (VENTOLIN HFA) 108 (90 Base) MCG/ACT inhaler Inhale 1-2 puffs into the lungs every 6 (six) hours as needed for wheezing or shortness of breath. 08/25/22  Yes Ilhan Madan L, PA  methylPREDNISolone (MEDROL DOSEPAK) 4 MG TBPK tablet Take 6 tablets (24 mg total) by mouth daily for 1 day, THEN 5 tablets (20 mg total) daily for 1 day, THEN 4 tablets (16 mg total) daily for 1 day, THEN 3 tablets (12 mg total) daily for 1 day, THEN 2 tablets (8 mg total) daily for 1 day, THEN 1 tablet (4 mg total) daily for 1 day. 08/25/22 08/30/22 Yes Taniela Feltus L, PA  pantoprazole (PROTONIX) 40 MG tablet Take 1 tablet (40 mg total) by mouth 2 (two) times daily. 08/25/22  Yes Jaelani Posa L, PA   amoxicillin-clavulanate (AUGMENTIN) 875-125 MG tablet Take 1 tablet by mouth 2 (two) times daily. 07/02/22   Billie Ruddy, MD  buPROPion (WELLBUTRIN SR) 150 MG 12 hr tablet Take 1 tablet (150 mg total) by mouth daily. 07/29/22   Billie Ruddy, MD  omeprazole (PRILOSEC) 20 MG capsule Take 1 capsule (20 mg total) by mouth daily. 07/02/22   Billie Ruddy, MD  tiZANidine (ZANAFLEX) 4 MG tablet Take 2 tablets (8 mg total) by mouth 2 (two) times daily as needed for muscle spasms. 07/28/22   Billie Ruddy, MD  bismuth-metronidazole-tetracycline [facility]) [phone] MG capsule Take 3 capsules by mouth 4 (four) times daily -  before meals and at bedtime for 10 days. 08/13/19 08/20/20  Milus Banister, MD  fluticasone (FLONASE) 50 MCG/ACT nasal spray PLACE 1 SPRAY INTO BOTH NOSTRILS DAILY. 08/08/19 08/20/20  Billie Ruddy, MD      Allergies    Patient has no known allergies.    Review of Systems   Review of Systems  Constitutional:  Negative for fever.  Respiratory:  Positive for cough, shortness of breath and wheezing.     Physical Exam Updated Vital Signs BP (!) 124/110   Pulse (!) 110   Temp 98.3 F (36.8 C)   Resp 15   Ht 5\' 5"  (1.651 m)   Wt 90.2 kg  SpO2 97%   BMI 33.09 kg/m  Physical Exam Vitals and nursing note reviewed.  Constitutional:      General: She is not in acute distress.    Appearance: She is well-developed.  HENT:     Head: Normocephalic and atraumatic.  Eyes:     Conjunctiva/sclera: Conjunctivae normal.  Cardiovascular:     Rate and Rhythm: Normal rate and regular rhythm.     Heart sounds: No murmur heard. Pulmonary:     Breath sounds: Stridor present. Wheezing and rhonchi present.     Comments: Diffuse wheezing and rhonchi throughout Abdominal:     Palpations: Abdomen is soft.     Tenderness: There is no abdominal tenderness.  Musculoskeletal:        General: No swelling.     Cervical back: Neck supple.  Skin:    General: Skin is warm and dry.      Capillary Refill: Capillary refill takes less than 2 seconds.  Neurological:     Mental Status: She is alert.  Psychiatric:        Mood and Affect: Mood normal.     ED Results / Procedures / Treatments   Labs (all labs ordered are listed, but only abnormal results are displayed) Labs Reviewed  COMPREHENSIVE METABOLIC PANEL - Abnormal; Notable for the following components:      Result Value   Potassium 3.2 (*)    CO2 21 (*)    Glucose, Bld 102 (*)    All other components within normal limits  CBC WITH DIFFERENTIAL/PLATELET - Abnormal; Notable for the following components:   Platelets 409 (*)    Monocytes Absolute 1.3 (*)    Abs Immature Granulocytes 0.19 (*)    All other components within normal limits  I-STAT VENOUS BLOOD GAS, ED - Abnormal; Notable for the following components:   pH, Ven 7.435 (*)    pCO2, Ven 33.6 (*)    pO2, Ven 63 (*)    Potassium 3.3 (*)    All other components within normal limits  RESP PANEL BY RT-PCR (RSV, FLU A&B, COVID)  RVPGX2  HCG, QUANTITATIVE, PREGNANCY    EKG EKG Interpretation  Date/Time:  Wednesday August 25 2022 10:31:39 EST Ventricular Rate:  107 PR Interval:  145 QRS Duration: 82 QT Interval:  327 QTC Calculation: 437 R Axis:   43 Text Interpretation: Sinus tachycardia Consider right atrial enlargement Borderline repolarization abnormality increased rate and nonspecific ST, Ts from prior 8/17 Confirmed by Aletta Edouard [phone]) on 08/25/2022 10:33:20 AM  Radiology DG Chest 1 View  Result Date: 08/25/2022 CLINICAL DATA:  Wheezing EXAM: CHEST  1 VIEW COMPARISON:  Frontal view chest x-ray earlier FINDINGS: Heart appears normal size. No confluent airspace opacities or effusions seen. No acute bony abnormality. IMPRESSION: No active disease. Electronically Signed   By: Rolm Baptise M.D.   On: 08/25/2022 12:03   DG Chest Port 1 View  Result Date: 08/25/2022 CLINICAL DATA:  Shortness of breath EXAM: PORTABLE CHEST 1 VIEW  COMPARISON:  01/12/2018 FINDINGS: The heart size and mediastinal contours are within normal limits. Both lungs are clear. The visualized skeletal structures are unremarkable except for slight scoliosis of the spine. Trachea midline. Aorta atherosclerotic. IMPRESSION: No active disease. Electronically Signed   By: Jerilynn Mages.  Shick M.D.   On: 08/25/2022 11:00    Procedures Procedures    Medications Ordered in ED Medications  lidocaine (LIDODERM) 5 % 1 patch (1 patch Transdermal Patch Applied 08/25/22 1218)  magnesium sulfate IVPB 2 g  50 mL (0 g Intravenous Stopped 08/25/22 1200)  Racepinephrine HCl 2.25 % nebulizer solution 0.5 mL (0.5 mLs Nebulization Given 08/25/22 1051)  albuterol (PROVENTIL,VENTOLIN) solution continuous neb (10 mg/hr Nebulization Given 08/25/22 1126)  sodium chloride 0.9 % bolus 1,000 mL (0 mLs Intravenous Stopped 08/25/22 1253)  ketorolac (TORADOL) 15 MG/ML injection 15 mg (15 mg Intravenous Given 08/25/22 1214)  pantoprazole (PROTONIX) injection 40 mg (40 mg Intravenous Given 08/25/22 1214)    ED Course/ Medical Decision Making/ A&P Clinical Course as of 08/25/22 1520  Wed Aug 25, 2022  1031 She is brought in by EMS for evaluation of shortness of breath.  Symptoms began after she had vomiting yesterday and coughing.  Received steroids and breathing treatment by EMS.  Getting labs and imaging symptomatic treatment and reassessment.  Disposition per response to treatment. [MB]    Clinical Course User Index [MB] Hayden Rasmussen, MD                             Medical Decision Making Patient is a 57 year old female, history of tobacco use, who presents to the ED secondary to shortness of breath wheezing, for 1 day.  She states that she has been chronically vomiting, and now she woke up and she was short of breath, wheezing.  No history of COPD, asthma, but does smoke.  We will obtain a chest x-ray, labs, EKG for further evaluation, and give her magnesium, and racemic epi secondary to  her stridor.  Further evaluate.  Amount and/or Complexity of Data Reviewed Labs: ordered.    Details: Labs are fairly unremarkable Radiology: ordered.    Details: Chest x-ray unremarkable ECG/medicine tests:  Decision-making details documented in ED Course. Discussion of management or test interpretation with external provider(s): After further evaluation, patient's wheezing has greatly improved, and stridor has resolved.  Will place her on a continuous albuterol neb, with great improvement of her symptoms.  She states that after the pantoprazole Toradol, the chest tightness resolved.  Her tachycardia, I believe is secondary to her use of albuterol as she received 2 DuoNebs, and albuterol inhaler prior to arrival.  We discussed follow-up with primary care doctor, and I believe that her symptoms are likely secondary to chemical induced pneumonitis secondary to acid from her acid reflux.  However she is afebrile febrile, and there is no pneumonia noted, thus we will not begin her on antibiotics.  We discussed the importance of following up with primary care doctor, and return precautions which she voiced understanding of.  I placed her on Protonix, 40 mg twice daily for her acid reflux, and encouraged her to follow-up with PCP, albuterol, and medrol dose pack to help for the inflammation of her lungs.    Risk OTC drugs. Prescription drug management.    Final Clinical Impression(s) / ED Diagnoses Final diagnoses:  Shortness of breath  Pneumonitis  Gastroesophageal reflux disease, unspecified whether esophagitis present  Chest pain, unspecified type    Rx / DC Orders ED Discharge Orders          Ordered    lidocaine (HM LIDOCAINE PATCH) 4 %  Every 24 hours,   Status:  Discontinued        08/25/22 1248    benzonatate (TESSALON) 100 MG capsule  Every 8 hours,   Status:  Discontinued        08/25/22 1248    pantoprazole (PROTONIX) 40 MG tablet  2 times daily  08/25/22 1254     albuterol (VENTOLIN HFA) 108 (90 Base) MCG/ACT inhaler  Every 6 hours PRN        08/25/22 1254    methylPREDNISolone (MEDROL DOSEPAK) 4 MG TBPK tablet  Daily        08/25/22 1254              Freja Faro, Harley Alto, PA 08/25/22 1522    Terrilee Files, MD 08/25/22 1721

## 2022-08-25 NOTE — ED Triage Notes (Signed)
States vomited x 2 yesterday and went to sleep and woke up coughing and wheezing  has IV 20  left AC  given 2 duo nebs and  one albuteral  total 15 albuteral c/o belly pain

## 2022-08-25 NOTE — ED Notes (Signed)
Neb held for patient to go to radiology

## 2022-08-25 NOTE — ED Notes (Signed)
Patient has a urine specimen in lab 

## 2022-08-26 ENCOUNTER — Other Ambulatory Visit: Payer: Self-pay | Admitting: Family Medicine

## 2022-08-26 DIAGNOSIS — R6882 Decreased libido: Secondary | ICD-10-CM

## 2022-08-27 ENCOUNTER — Encounter: Payer: Self-pay | Admitting: Family Medicine

## 2022-08-27 ENCOUNTER — Ambulatory Visit: Payer: Managed Care, Other (non HMO) | Admitting: Family Medicine

## 2022-08-27 VITALS — BP 130/91 | HR 67 | Temp 98.5°F | Ht 65.0 in | Wt 194.8 lb

## 2022-08-27 DIAGNOSIS — R030 Elevated blood-pressure reading, without diagnosis of hypertension: Secondary | ICD-10-CM | POA: Diagnosis not present

## 2022-08-27 DIAGNOSIS — R051 Acute cough: Secondary | ICD-10-CM | POA: Diagnosis not present

## 2022-08-27 DIAGNOSIS — J22 Unspecified acute lower respiratory infection: Secondary | ICD-10-CM

## 2022-08-27 MED ORDER — AMOXICILLIN-POT CLAVULANATE 875-125 MG PO TABS: 1.0000 | ORAL_TABLET | Freq: Two times a day (BID) | ORAL | 0 refills | Status: AC

## 2022-08-27 MED ORDER — BENZONATATE 200 MG PO CAPS: 200.0000 mg | ORAL_CAPSULE | Freq: Two times a day (BID) | ORAL | 0 refills | Status: DC | PRN

## 2022-08-27 NOTE — Patient Instructions (Signed)
The chest x-ray done at the ED was negative, however they only obtained 1 view.  It appears the urgent care sent in a prescription for the acid reflux medicine pantoprazole (Protonix) 40 mg twice a day, methylprednisolone (steroid), and the albuterol inhaler.  I sent in a prescription for the antibiotic Augmentin.  You can take 1 pill twice a day for the next 7 days.  I also sent in a prescription for Tessalon, this is a medication to help with cough during the day.  Continue drinking plenty of water and fluids.  Consider picking up the albuterol inhaler as this can help with the wheezing and shortness of breath.  For continued or worsening symptoms I would repeat the chest x-ray in the next week with at least 2 views.

## 2022-08-27 NOTE — Progress Notes (Signed)
   Established Patient Office Visit   Subjective  Patient ID: Lauren Boyle, female    DOB: 1965-09-12  Age: 57 y.o. MRN: [id number]  Chief Complaint  Patient presents with   Follow-up    From ED visit on 08/25/2022 for cough, sob and wheezing.     Pt is a 57 yo female with  has a past medical history of Arthritis, Chicken pox, Depression, History of meniscal tear, Iron deficiency, Obesity, and Osteoarthritis of knee who was seen for acute concern.  Pt endorses cough, and lightheadedness on Tuesday.  She then developed SOB, congestion, wheezing, and HA on Wed 1/31.  Transported to ED on 08/25/22 via EMS.  Given solumedrol and duonebs en route.  In ED COVID, RSV, and flu testing and CXR negative.  Symptoms thought exacerbated by acid reflux.  Given medrol dose pack.      ROS Negative unless stated above    Objective:     BP (!) 130/91 (BP Location: Left Arm, Cuff Size: Large)   Pulse 67   Temp 98.5 F (36.9 C) (Oral)   Ht 5' 5"$  (1.651 m)   Wt 194 lb 12.8 oz (88.4 kg)   SpO2 97%   BMI 32.42 kg/m    Physical Exam Constitutional:      General: She is not in acute distress.    Appearance: Normal appearance.  HENT:     Head: Normocephalic and atraumatic.     Nose: Nose normal.     Mouth/Throat:     Mouth: Mucous membranes are moist.  Cardiovascular:     Rate and Rhythm: Normal rate and regular rhythm.     Heart sounds: Normal heart sounds. No murmur heard.    No gallop.  Pulmonary:     Effort: Pulmonary effort is normal. No respiratory distress.     Breath sounds: Normal breath sounds. No wheezing, rhonchi or rales.  Skin:    General: Skin is warm and dry.  Neurological:     Mental Status: She is alert and oriented to person, place, and time.      No results found for any visits on 08/27/22.    Assessment & Plan:  Acute lower respiratory tract infection -advised to complete medrol dose pack. -start abx for possible pna due to aspiration though CXR in ED  negative. -given strict precautions. -     Amoxicillin-Pot Clavulanate; Take 1 tablet by mouth 2 (two) times daily for 7 days.  Dispense: 14 tablet; Refill: 0  Acute cough -advised symptoms likely multifactorial acute illness and acid reflux. -continue protonix 40 mg BID -continue albuterol prn and other supportive care. -smoking cessation advised -     Benzonatate; Take 1 capsule (200 mg total) by mouth 2 (two) times daily as needed for cough.  Dispense: 20 capsule; Refill: 0  Elevated blood pressure reading in office without diagnosis of hypertension -likely elevated due to steroids -smoking cessation advised. -continue to monitor  Return if symptoms worsen or fail to improve.   Billie Ruddy, MD

## 2022-09-01 ENCOUNTER — Ambulatory Visit: Payer: Managed Care, Other (non HMO) | Admitting: Family Medicine

## 2022-09-27 ENCOUNTER — Other Ambulatory Visit: Payer: Self-pay | Admitting: Family Medicine

## 2022-09-27 DIAGNOSIS — R1310 Dysphagia, unspecified: Secondary | ICD-10-CM

## 2022-09-28 ENCOUNTER — Other Ambulatory Visit: Payer: Self-pay | Admitting: Family Medicine

## 2022-09-28 DIAGNOSIS — M545 Other chronic pain: Secondary | ICD-10-CM

## 2022-09-28 DIAGNOSIS — R220 Localized swelling, mass and lump, head: Secondary | ICD-10-CM

## 2022-09-29 NOTE — Telephone Encounter (Signed)
Spoke to patient. Pt states she still has refill and doesn't a refill at the moment.

## 2022-10-30 ENCOUNTER — Other Ambulatory Visit: Payer: Self-pay | Admitting: Family Medicine

## 2022-10-30 DIAGNOSIS — G8929 Other chronic pain: Secondary | ICD-10-CM

## 2022-12-01 ENCOUNTER — Ambulatory Visit (HOSPITAL_COMMUNITY): Payer: Managed Care, Other (non HMO)

## 2022-12-01 ENCOUNTER — Telehealth: Payer: Self-pay | Admitting: Family Medicine

## 2022-12-01 ENCOUNTER — Ambulatory Visit: Payer: Managed Care, Other (non HMO) | Admitting: Family Medicine

## 2022-12-01 VITALS — BP 138/86 | HR 61 | Temp 98.4°F | Wt 190.1 lb

## 2022-12-01 DIAGNOSIS — M545 Low back pain, unspecified: Secondary | ICD-10-CM | POA: Diagnosis not present

## 2022-12-01 DIAGNOSIS — R1031 Right lower quadrant pain: Secondary | ICD-10-CM

## 2022-12-01 DIAGNOSIS — R350 Frequency of micturition: Secondary | ICD-10-CM | POA: Diagnosis not present

## 2022-12-01 DIAGNOSIS — K4090 Unilateral inguinal hernia, without obstruction or gangrene, not specified as recurrent: Secondary | ICD-10-CM

## 2022-12-01 DIAGNOSIS — M419 Scoliosis, unspecified: Secondary | ICD-10-CM

## 2022-12-01 DIAGNOSIS — K5900 Constipation, unspecified: Secondary | ICD-10-CM

## 2022-12-01 DIAGNOSIS — R319 Hematuria, unspecified: Secondary | ICD-10-CM

## 2022-12-01 LAB — POC URINALSYSI DIPSTICK (AUTOMATED)
Bilirubin, UA: NEGATIVE
Glucose, UA: NEGATIVE
Ketones, UA: NEGATIVE
Leukocytes, UA: NEGATIVE
Nitrite, UA: NEGATIVE
Protein, UA: POSITIVE — AB
Spec Grav, UA: >=1.03 — AB (ref 1.010–1.025)
Urobilinogen, UA: NEGATIVE E.U./dL — AB
pH, UA: 5.5 (ref 5.0–8.0)

## 2022-12-01 MED ORDER — SENNOSIDES-DOCUSATE SODIUM 8.6-50 MG PO TABS: 1.0000 | ORAL_TABLET | Freq: Every day | ORAL | 1 refills | Status: DC

## 2022-12-01 NOTE — Patient Instructions (Addendum)
A referral was placed for CT scan of the abdomen pelvis and for the general surgeon.  You should expect a phone call about scheduling these appointments.  For worsening symptoms proceed to nearest emergency department  A prescription for Colace-senna was also sent to your pharmacy.  This is to help with constipation.

## 2022-12-01 NOTE — Telephone Encounter (Signed)
Spoke with pt. Is aware work note is in [employer] in front office.

## 2022-12-01 NOTE — Telephone Encounter (Signed)
Pt was just seen by MD today - 12/01/22. Pt forgot to get a Doctors Note for work. Pt would like to return to work on Monday - 12/06/22 Pt stated she would like to come in tomorrow morning to pick up the letter. Please advise.

## 2022-12-01 NOTE — Progress Notes (Unsigned)
   Established Patient Office Visit   Subjective  Patient ID: Lauren Boyle, female    DOB: Sep 30, 1965  Age: 57 y.o. MRN: [id number]  Chief Complaint  Patient presents with  . Back Pain    Lower back pain, lower abdomen pressure. Back pain has been going on for a while, was taking the muscle relaxers but would have to take 2-3 for it to help.     Pt is a 57 yo female with pmh sig for chronic low back pain, history of UTI who was seen for low back pain.  Patient endorses low back feeling sore/tender to touch and tight.  Occurring more frequently with standing.   {History (Optional):23778}  ROS Negative unless stated above    Objective:     BP 138/86 (BP Location: Left Arm, Patient Position: Sitting, Cuff Size: Normal)   Pulse 61   Temp 98.4 F (36.9 C) (Oral)   Wt 190 lb 1.6 oz (86.2 kg)   SpO2 95%   BMI 31.63 kg/m  {Vitals History (Optional):23777}  Physical Exam Constitutional:      General: She is not in acute distress.    Appearance: Normal appearance.  HENT:     Head: Normocephalic and atraumatic.     Nose: Nose normal.     Mouth/Throat:     Mouth: Mucous membranes are moist.  Cardiovascular:     Rate and Rhythm: Normal rate and regular rhythm.     Heart sounds: Normal heart sounds. No murmur heard.    No gallop.  Pulmonary:     Effort: Pulmonary effort is normal. No respiratory distress.     Breath sounds: Normal breath sounds. No wheezing, rhonchi or rales.  Skin:    General: Skin is warm and dry.  Neurological:     Mental Status: She is alert and oriented to person, place, and time.     Results for orders placed or performed in visit on 12/01/22  POCT Urinalysis Dipstick (Automated)  Result Value Ref Range   Color, UA dark yellow    Clarity, UA clear    Glucose, UA Negative Negative   Bilirubin, UA neg    Ketones, UA neg    Spec Grav, UA >=1.030 (A) 1.010 - 1.025   Blood, UA 3+    pH, UA 5.5 5.0 - 8.0   Protein, UA Positive (A) Negative    Urobilinogen, UA negative (A) 0.2 or 1.0 E.U./dL   Nitrite, UA neg    Leukocytes, UA Negative Negative      Assessment & Plan:  Inguinal hernia of right side without obstruction or gangrene -     Ambulatory referral to General Surgery -     CT ABDOMEN PELVIS W CONTRAST; Future  Low back pain, unspecified back pain laterality, unspecified chronicity, unspecified whether sciatica present -     POCT Urinalysis Dipstick (Automated)  Urinary frequency -     POCT Urinalysis Dipstick (Automated)  Right lower quadrant abdominal pain -     CT ABDOMEN PELVIS W CONTRAST; Future  Constipation, unspecified constipation type -     Sennosides-Docusate Sodium; Take 1 tablet by mouth daily.  Dispense: 30 tablet; Refill: 1  Scoliosis of lumbosacral spine, unspecified scoliosis type  Hematuria, unspecified type    Return if symptoms worsen or fail to improve.   Deeann Saint, MD

## 2022-12-02 ENCOUNTER — Telehealth: Payer: Self-pay | Admitting: Family Medicine

## 2022-12-02 NOTE — Telephone Encounter (Signed)
Pt is requesting a call back about reading her lab results on MyChart.

## 2022-12-02 NOTE — Telephone Encounter (Signed)
No new results to go over. Pt was informed urine culture would take a few days.

## 2022-12-03 ENCOUNTER — Telehealth: Payer: Self-pay | Admitting: Family Medicine

## 2022-12-03 ENCOUNTER — Other Ambulatory Visit: Payer: Managed Care, Other (non HMO)

## 2022-12-03 NOTE — Telephone Encounter (Signed)
Patient dropped off document FMLA, to be filled out by provider. Patient requested to send it via Fax to the disability office within 5-days. Document is located in providers tray at front office.Please advise at Mobile [phone] (mobile)   Please advise

## 2022-12-04 LAB — URINE CULTURE
MICRO NUMBER:: 14930175
SPECIMEN QUALITY:: ADEQUATE

## 2022-12-06 ENCOUNTER — Other Ambulatory Visit: Payer: Self-pay | Admitting: Family Medicine

## 2022-12-06 ENCOUNTER — Telehealth: Payer: Self-pay | Admitting: Family Medicine

## 2022-12-06 DIAGNOSIS — N3001 Acute cystitis with hematuria: Secondary | ICD-10-CM

## 2022-12-06 DIAGNOSIS — Z0279 Encounter for issue of other medical certificate: Secondary | ICD-10-CM

## 2022-12-06 MED ORDER — AMOXICILLIN-POT CLAVULANATE 500-125 MG PO TABS: 1.0000 | ORAL_TABLET | Freq: Two times a day (BID) | ORAL | 0 refills | Status: AC

## 2022-12-06 NOTE — Telephone Encounter (Signed)
Pt is calling and lower abd is still  hurting and radiating down to right leg. Pt has an appt on Thursday concerning her back. Pt is still waiting on CT abd pelvis with contrast. Please advise

## 2022-12-08 ENCOUNTER — Encounter: Payer: Self-pay | Admitting: Family Medicine

## 2022-12-08 ENCOUNTER — Ambulatory Visit: Payer: Managed Care, Other (non HMO) | Admitting: Family Medicine

## 2022-12-08 VITALS — BP 132/82 | HR 70 | Temp 98.4°F | Wt 188.8 lb

## 2022-12-08 DIAGNOSIS — K4090 Unilateral inguinal hernia, without obstruction or gangrene, not specified as recurrent: Secondary | ICD-10-CM | POA: Diagnosis not present

## 2022-12-08 DIAGNOSIS — R1031 Right lower quadrant pain: Secondary | ICD-10-CM | POA: Diagnosis not present

## 2022-12-08 NOTE — Telephone Encounter (Signed)
Pt has visit scheduled to discuss continued symptoms.

## 2022-12-08 NOTE — Telephone Encounter (Signed)
Pt has scheduled visit for today.

## 2022-12-08 NOTE — Progress Notes (Signed)
   Established Patient Office Visit   Subjective  Patient ID: Lauren Boyle, female    DOB: 1966/05/05  Age: 57 y.o. MRN: [id number]  Chief Complaint  Patient presents with   Follow-up    Pain in pelvic area, where hernia is located. Feels pressure from rt lower quad to back     Patient is a 57 year old female seen for follow-up.  Patient seen 5/8 for low back and pelvic pain.  CT abdomen pelvis ordered for concern for inguinal hernia, however pt was unable to have imaging done that day.  Pt states she does not feel well.  Now with increased, constant RLQ/groin pain, nausea, vomiting.  Endorses loose stools, decreased appetite.  Fullness of right lower abdomen/groin larger than at last OFV.      ROS Negative unless stated above    Objective:     BP 132/82 (BP Location: Left Arm, Patient Position: Sitting, Cuff Size: Normal)   Pulse 70   Temp 98.4 F (36.9 C) (Oral)   Wt 188 lb 12.8 oz (85.6 kg)   SpO2 97%   BMI 31.42 kg/m    Physical Exam Constitutional:      General: She is not in acute distress.    Appearance: She is ill-appearing.  HENT:     Head: Normocephalic and atraumatic.     Nose: Nose normal.     Mouth/Throat:     Mouth: Mucous membranes are moist.  Cardiovascular:     Rate and Rhythm: Normal rate and regular rhythm.     Heart sounds: Normal heart sounds. No murmur heard.    No gallop.  Pulmonary:     Effort: Pulmonary effort is normal. No respiratory distress.     Breath sounds: Normal breath sounds. No wheezing, rhonchi or rales.  Abdominal:       Comments: Fullness, edema of right lower abdomen and pannus with TTP.  A firm round, ~2 cm, area underneath right pannus.  Skin:    General: Skin is warm and dry.  Neurological:     Mental Status: She is alert and oriented to person, place, and time.      No results found for any visits on 12/08/22.    Assessment & Plan:  Inguinal hernia of right side without obstruction or gangrene  RLQ  abdominal pain  Concern for worsening of right inguinal hernia, now incarcerated.  Patient advised to proceed to nearest ED immediately for imaging and further evaluation.  Return if symptoms worsen or fail to improve.   Deeann Saint, MD

## 2022-12-08 NOTE — Patient Instructions (Signed)
Pt to proceed to nearest ED for further evaluation on abd pain, n/v, due to concern for right incarcerated inguinal hernia

## 2022-12-09 ENCOUNTER — Other Ambulatory Visit: Payer: Self-pay

## 2022-12-09 ENCOUNTER — Emergency Department (HOSPITAL_COMMUNITY)
Admission: EM | Admit: 2022-12-09 | Discharge: 2022-12-09 | Disposition: A | Discharge status: Home / Self Care | Payer: Managed Care, Other (non HMO) | Attending: Emergency Medicine | Admitting: Emergency Medicine

## 2022-12-09 ENCOUNTER — Ambulatory Visit: Payer: Managed Care, Other (non HMO) | Admitting: Family Medicine

## 2022-12-09 ENCOUNTER — Encounter (HOSPITAL_COMMUNITY): Payer: Self-pay

## 2022-12-09 ENCOUNTER — Emergency Department (HOSPITAL_COMMUNITY): Payer: Managed Care, Other (non HMO)

## 2022-12-09 DIAGNOSIS — M899 Disorder of bone, unspecified: Secondary | ICD-10-CM | POA: Insufficient documentation

## 2022-12-09 DIAGNOSIS — R1031 Right lower quadrant pain: Secondary | ICD-10-CM | POA: Diagnosis present

## 2022-12-09 LAB — URINALYSIS, ROUTINE W REFLEX MICROSCOPIC
Bilirubin Urine: NEGATIVE
Glucose, UA: NEGATIVE mg/dL
Ketones, ur: NEGATIVE mg/dL
Leukocytes,Ua: NEGATIVE
Nitrite: NEGATIVE
Protein, ur: NEGATIVE mg/dL
Specific Gravity, Urine: 1.016 (ref 1.005–1.030)
pH: 5 (ref 5.0–8.0)

## 2022-12-09 LAB — LIPASE, BLOOD: Lipase: 25 U/L (ref 11–51)

## 2022-12-09 LAB — CBC WITH DIFFERENTIAL/PLATELET
Abs Immature Granulocytes: 0.03 10*3/uL (ref 0.00–0.07)
Basophils Absolute: 0.1 10*3/uL (ref 0.0–0.1)
Basophils Relative: 1 %
Eosinophils Absolute: 0.3 10*3/uL (ref 0.0–0.5)
Eosinophils Relative: 4 %
HCT: 42.4 % (ref 36.0–46.0)
Hemoglobin: 13.3 g/dL (ref 12.0–15.0)
Immature Granulocytes: 1 %
Lymphocytes Relative: 41 %
Lymphs Abs: 2.6 10*3/uL (ref 0.7–4.0)
MCH: 27.5 pg (ref 26.0–34.0)
MCHC: 31.4 g/dL (ref 30.0–36.0)
MCV: 87.8 fL (ref 80.0–100.0)
Monocytes Absolute: 0.5 10*3/uL (ref 0.1–1.0)
Monocytes Relative: 8 %
Neutro Abs: 2.8 10*3/uL (ref 1.7–7.7)
Neutrophils Relative %: 45 %
Platelets: 421 10*3/uL — ABNORMAL HIGH (ref 150–400)
RBC: 4.83 MIL/uL (ref 3.87–5.11)
RDW: 14.6 % (ref 11.5–15.5)
WBC: 6.3 10*3/uL (ref 4.0–10.5)
nRBC: 0 % (ref 0.0–0.2)

## 2022-12-09 LAB — COMPREHENSIVE METABOLIC PANEL
ALT: 15 U/L (ref 0–44)
AST: 19 U/L (ref 15–41)
Albumin: 3.9 g/dL (ref 3.5–5.0)
Alkaline Phosphatase: 63 U/L (ref 38–126)
Anion gap: 8 (ref 5–15)
BUN: 9 mg/dL (ref 6–20)
CO2: 26 mmol/L (ref 22–32)
Calcium: 9.2 mg/dL (ref 8.9–10.3)
Chloride: 108 mmol/L (ref 98–111)
Creatinine, Ser: 0.73 mg/dL (ref 0.44–1.00)
GFR, Estimated: >60 mL/min (ref >60)
Glucose, Bld: 92 mg/dL (ref 70–99)
Potassium: 3.5 mmol/L (ref 3.5–5.1)
Sodium: 142 mmol/L (ref 135–145)
Total Bilirubin: 0.6 mg/dL (ref 0.3–1.2)
Total Protein: 7.5 g/dL (ref 6.5–8.1)

## 2022-12-09 MED ORDER — SODIUM CHLORIDE 0.9 % IV BOLUS
500.0000 mL | Freq: Once | INTRAVENOUS | Status: AC
  Administered 2022-12-09: 500 mL via INTRAVENOUS

## 2022-12-09 MED ORDER — IOHEXOL 300 MG/ML  SOLN
100.0000 mL | Freq: Once | INTRAMUSCULAR | Status: AC | PRN
  Administered 2022-12-09: 100 mL via INTRAVENOUS

## 2022-12-09 MED ORDER — ONDANSETRON HCL 4 MG/2ML IJ SOLN
4.0000 mg | Freq: Once | INTRAMUSCULAR | Status: AC
  Administered 2022-12-09: 4 mg via INTRAVENOUS
  Filled 2022-12-09: qty 2

## 2022-12-09 MED ORDER — MORPHINE SULFATE (PF) 4 MG/ML IV SOLN
4.0000 mg | Freq: Once | INTRAVENOUS | Status: AC
  Administered 2022-12-09: 4 mg via INTRAVENOUS
  Filled 2022-12-09: qty 1

## 2022-12-09 NOTE — ED Triage Notes (Signed)
Patient has had abdominal pain x3 weeks. Went to see her doctor Wednesday and was told she has a right inguinal hernia. Has had vomiting and diarrhea.

## 2022-12-09 NOTE — Discharge Instructions (Signed)
You were seen in the emergency room today with right inguinal pain.  Your CT does not show any hernia or other process requiring you to see a general surgeon.  You do have an abnormal lesion in the right femur which may be causing some discomfort.  I would like for you to take Tylenol and or ibuprofen as needed for discomfort and follow-up with your primary care doctor.  They can review the scans and decide if outpatient CT of your hip versus MRI is reasonable given your location of pain.

## 2022-12-09 NOTE — ED Provider Notes (Signed)
Emergency Department Provider Note   I have reviewed the triage vital signs and the nursing notes.   HISTORY  Chief Complaint Abdominal Pain   HPI Lauren Boyle is a 57 y.o. female with PMH reviewed below presents to the emergency department for evaluation of right inguinal pain with some focal swelling.  She has been experiencing symptoms over the past 3 weeks and has been following with her primary care doctor.  After initial exam, she was referred for outpatient CT but insurance has not approved this as of yet.  She has had worsening symptoms along with vomiting.  She continues to pass gas and have bowel movements although some hesitancy with this.  No rectal pain.  She notes that she is also being treated for urinary tract infection and started antibiotic 2 days ago from her PCP.  No significant dysuria, hesitancy, urgency.     Past Medical History:  Diagnosis Date   Arthritis    Chicken pox    Depression    History of meniscal tear    left   Iron deficiency    Obesity    Osteoarthritis of knee    left    Review of Systems  Constitutional: No fever/chills Eyes: No visual changes. ENT: No sore throat. Cardiovascular: Denies chest pain. Respiratory: Denies shortness of breath. Gastrointestinal: Positive RLQ abdominal pain. Positive nausea and vomiting.  No diarrhea.  No constipation. Genitourinary: Negative for dysuria. Musculoskeletal: Negative for back pain. Skin: Negative for rash. Neurological: Negative for headaches.   ____________________________________________   PHYSICAL EXAM:  VITAL SIGNS: ED Triage Vitals  Enc Vitals Group     BP 12/09/22 1211 (!) 124/90     Pulse Rate 12/09/22 1211 70     Resp 12/09/22 1211 19     Temp 12/09/22 1211 97.9 F (36.6 C)     Temp Source 12/09/22 1211 Oral     SpO2 12/09/22 1211 100 %     Weight 12/09/22 1212 188 lb (85.3 kg)     Height 12/09/22 1212 5\' 5"  (1.651 m)   Constitutional: Alert and oriented. Well  appearing and in no acute distress. Eyes: Conjunctivae are normal.  Head: Atraumatic. Nose: No congestion/rhinnorhea. Mouth/Throat: Mucous membranes are moist. Neck: No stridor.   Cardiovascular: Normal rate, regular rhythm. Good peripheral circulation. Grossly normal heart sounds.   Respiratory: Normal respiratory effort.  No retractions. Lungs CTAB. Gastrointestinal: Soft with focal tenderness in the RLQ  and right inguinal crease. No peritonitis. No distention.  Musculoskeletal: No lower extremity tenderness nor edema. No gross deformities of extremities. Neurologic:  Normal speech and language. No gross focal neurologic deficits are appreciated.  Skin:  Skin is warm, dry and intact. No rash noted.   ____________________________________________   LABS (all labs ordered are listed, but only abnormal results are displayed)  Labs Reviewed  CBC WITH DIFFERENTIAL/PLATELET - Abnormal; Notable for the following components:      Result Value   Platelets 421 (*)    All other components within normal limits  URINALYSIS, ROUTINE W REFLEX MICROSCOPIC - Abnormal; Notable for the following components:   Hgb urine dipstick MODERATE (*)    Bacteria, UA RARE (*)    All other components within normal limits  COMPREHENSIVE METABOLIC PANEL  LIPASE, BLOOD    ____________________________________________   PROCEDURES  Procedure(s) performed:   Procedures  None ____________________________________________   INITIAL IMPRESSION / ASSESSMENT AND PLAN / ED COURSE  Pertinent labs & imaging results that were available during my care  of the patient were reviewed by me and considered in my medical decision making (see chart for details).   This patient is Presenting for Evaluation of abdominal pain, which does require a range of treatment options, and is a complaint that involves a high risk of morbidity and mortality.  The Differential Diagnoses includes but is not exclusive to acute  appendicitis, renal colic, testicular torsion, urinary tract infection, prostatitis,  diverticulitis, small bowel obstruction, colitis, abdominal aortic aneurysm, gastroenteritis, constipation etc.   Critical Interventions-    Medications  sodium chloride 0.9 % bolus 500 mL (0 mLs Intravenous Stopped 12/09/22 1640)  morphine (PF) 4 MG/ML injection 4 mg (4 mg Intravenous Given 12/09/22 1424)  ondansetron (ZOFRAN) injection 4 mg (4 mg Intravenous Given 12/09/22 1424)  iohexol (OMNIPAQUE) 300 MG/ML solution 100 mL (100 mLs Intravenous Contrast Given 12/09/22 1512)    Reassessment after intervention: symptoms improved.   I decided to review pertinent External Data, and in summary patient followed by [place] PCP with note yesterday advising ED presentation for worsening symptoms.   Clinical Laboratory Tests Ordered, included LFts and bilirubin normal. Lipase normal.   Radiologic Tests Ordered, included CT abdomen/pelvis. I independently interpreted the images and agree with radiology interpretation.   Cardiac Monitor Tracing which shows NSR.    Social Determinants of Health Risk patient with a smoking history.   Medical Decision Making: Summary:  Patient presents to the emergency department with right lower quadrant abdominal pain.  Clinically seems most consistent with right inguinal hernia.  No evidence on exam of incarceration but do plan for CT imaging for further assessment along with labs and pain management here.  Reevaluation with update and discussion with patient. Discussed CT results. No hernia. Discussed right femur finding. Patient to follow with PCP regarding non-emergent imaging. Listed on AVS.   Patient's presentation is most consistent with acute presentation with potential threat to life or bodily function.   Disposition: discharge  ____________________________________________  FINAL CLINICAL IMPRESSION(S) / ED DIAGNOSES  Final diagnoses:  Right lower quadrant  abdominal pain  Bone lesion     Note:  This document was prepared using Dragon voice recognition software and may include unintentional dictation errors.  Alona Bene, MD, [facility] Emergency Medicine    Surena Welge, Arlyss Repress, MD 12/18/22 0730

## 2022-12-28 ENCOUNTER — Other Ambulatory Visit: Payer: Self-pay | Admitting: Family Medicine

## 2022-12-28 DIAGNOSIS — R6882 Decreased libido: Secondary | ICD-10-CM

## 2022-12-31 ENCOUNTER — Ambulatory Visit (INDEPENDENT_AMBULATORY_CARE_PROVIDER_SITE_OTHER): Payer: Managed Care, Other (non HMO)

## 2022-12-31 DIAGNOSIS — Z111 Encounter for screening for respiratory tuberculosis: Secondary | ICD-10-CM

## 2022-12-31 NOTE — Progress Notes (Signed)
PPD Placement note Lauren Boyle, 57 y.o. 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 Pt taken PPD test before: yes Verified in allergy area and with patient that they are not allergic to the products PPD is made of (Phenol or Tween). No:  Is patient taking any oral or IV steroid medication now or have they taken it in the last month? no Has the patient ever received the BCG vaccine?: no Has the patient been in recent contact with anyone known or suspected of having active TB disease?: no      Date of exposure (if applicable):       Name of person they were exposed to (if applicable):  Patient's Country of origin?: [place] O: Alert and oriented in NAD. P:  PPD placed on 12/31/2022.  Patient advised to return for reading within 48-72 hours.   TB skin was placed on R forearm.

## 2023-01-03 ENCOUNTER — Ambulatory Visit: Payer: Managed Care, Other (non HMO)

## 2023-01-03 LAB — TB SKIN TEST
Induration: 0 mm
TB Skin Test: NEGATIVE

## 2023-01-04 NOTE — Progress Notes (Signed)
PPD Reading Note PPD read and results entered in EpicCare. Result: 0 mm induration. Interpretation: Negative If test not read within 48-72 hours of initial placement, patient advised to repeat in other arm 1-3 weeks after this test. Allergic reaction: redness from lotion, not raised. Was recheck by Shawn Route., CMA

## 2023-01-19 ENCOUNTER — Institutional Professional Consult (permissible substitution): Payer: Self-pay | Admitting: Plastic Surgery

## 2023-01-31 ENCOUNTER — Encounter (HOSPITAL_COMMUNITY): Payer: Self-pay | Admitting: Emergency Medicine

## 2023-01-31 ENCOUNTER — Emergency Department (HOSPITAL_COMMUNITY): Payer: 59

## 2023-01-31 ENCOUNTER — Ambulatory Visit (INDEPENDENT_AMBULATORY_CARE_PROVIDER_SITE_OTHER)
Admission: RE | Admit: 2023-01-31 | Discharge: 2023-01-31 | Disposition: A | Discharge status: Home / Self Care | Payer: Managed Care, Other (non HMO) | Source: Ambulatory Visit

## 2023-01-31 ENCOUNTER — Other Ambulatory Visit: Payer: Self-pay

## 2023-01-31 ENCOUNTER — Emergency Department (HOSPITAL_COMMUNITY)
Admission: EM | Admit: 2023-01-31 | Discharge: 2023-01-31 | Discharge status: Against Medical Advice | Payer: 59 | Attending: Emergency Medicine | Admitting: Emergency Medicine

## 2023-01-31 VITALS — BP 155/90 | HR 69 | Temp 97.8°F | Resp 18

## 2023-01-31 DIAGNOSIS — Z5329 Procedure and treatment not carried out because of patient's decision for other reasons: Secondary | ICD-10-CM | POA: Insufficient documentation

## 2023-01-31 DIAGNOSIS — N3001 Acute cystitis with hematuria: Secondary | ICD-10-CM | POA: Insufficient documentation

## 2023-01-31 DIAGNOSIS — R300 Dysuria: Secondary | ICD-10-CM | POA: Insufficient documentation

## 2023-01-31 DIAGNOSIS — M25562 Pain in left knee: Secondary | ICD-10-CM | POA: Insufficient documentation

## 2023-01-31 DIAGNOSIS — Z89522 Acquired absence of left knee: Secondary | ICD-10-CM | POA: Diagnosis not present

## 2023-01-31 LAB — POCT URINALYSIS DIP (MANUAL ENTRY)
Glucose, UA: NEGATIVE mg/dL
Ketones, POC UA: NEGATIVE mg/dL
Nitrite, UA: NEGATIVE
Protein Ur, POC: NEGATIVE mg/dL
Spec Grav, UA: 1.02 (ref 1.010–1.025)
Urobilinogen, UA: 0.2 E.U./dL
pH, UA: 5.5 (ref 5.0–8.0)

## 2023-01-31 MED ORDER — CEPHALEXIN 500 MG PO CAPS: 500.0000 mg | ORAL_CAPSULE | Freq: Two times a day (BID) | ORAL | 0 refills | Status: AC

## 2023-01-31 NOTE — ED Triage Notes (Addendum)
Pt c/o lt knee pain and swelling x2 days. States had a knee replacement a few years ago. Denies any long car/plane rides.  Pt c/o UTI sx's x1wk.

## 2023-01-31 NOTE — Discharge Instructions (Signed)
You left the department without notifying staff.  This is very dangerous as your workup was not completed.  Please return to the emergency department tonight.

## 2023-01-31 NOTE — ED Provider Notes (Signed)
[facility]    CSN: [id number] Arrival date & time: 01/31/23  1342      History   Chief Complaint Chief Complaint  Patient presents with   Knee Pain   Urinary Tract Infection    HPI Lauren Boyle is a 57 y.o. female.   Patient presents with 2 different chief complaints today.  Patient reports left knee pain and swelling for about 2 to 3 days.  Denies any injury to the area.  Patient does report history of knee replacement in that knee approximately 2 years ago with no previous complications.  Patient has not taken any medication for pain.  Denies numbness or tingling but is having pain and difficulty with bearing weight.  Denies any recent long distance travel in a car or plane.  Patient also reporting some dysuria and lower abdominal bladder pressure for about a week.  Denies vaginal discharge, urinary frequency, back pain, fever, chills, nausea, vomiting.  Patient had a UTI approximately 2 months ago as well.   Knee Pain Urinary Tract Infection   Past Medical History:  Diagnosis Date   Arthritis    Chicken pox    Depression    History of meniscal tear    left   Iron deficiency    Obesity    Osteoarthritis of knee    left    Patient Active Problem List   Diagnosis Date Noted   Low back pain 07/04/2013    Past Surgical History:  Procedure Laterality Date   ABDOMINAL HYSTERECTOMY     partial   CHONDROPLASTY Left 09/17/2016   Procedure: CHONDROPLASTY;  Surgeon: Frederico Hamman, MD;  Location: [facility];  Service: Orthopedics;  Laterality: Left;   COLONOSCOPY     KNEE ARTHROSCOPY WITH MEDIAL MENISECTOMY Left 09/17/2016   Procedure: KNEE ARTHROSCOPY WITH PARTIAL MEDIAL MENISECTOMY;  Surgeon: Frederico Hamman, MD;  Location: [facility];  Service: Orthopedics;  Laterality: Left;   ORIF ULNAR FRACTURE Left 04/14/2015   Procedure: OPEN REDUCTION INTERNAL FIXATION (ORIF) ULNAR FRACTURE;  Surgeon: Eldred Manges, MD;  Location: [facility]  OR;  Service: Orthopedics;  Laterality: Left;   TOTAL KNEE ARTHROPLASTY Left 05/13/2017   Procedure: LEFT TOTAL KNEE ARTHROPLASTY;  Surgeon: Frederico Hamman, MD;  Location: [facility] OR;  Service: Orthopedics;  Laterality: Left;    OB History   No obstetric history on file.      Home Medications    Prior to Admission medications   Medication Sig Start Date End Date Taking? Authorizing Provider  cephALEXin (KEFLEX) 500 MG capsule Take 1 capsule (500 mg total) by mouth 2 (two) times daily for 7 days. 01/31/23 02/07/23 Yes Fiora Weill, Acie Fredrickson, FNP  albuterol (VENTOLIN HFA) 108 (90 Base) MCG/ACT inhaler Inhale 1-2 puffs into the lungs every 6 (six) hours as needed for wheezing or shortness of breath. Patient not taking: Reported on 08/27/2022 08/25/22   Small, Brooke L, PA  buPROPion (WELLBUTRIN SR) 150 MG 12 hr tablet TAKE 1 TABLET BY MOUTH EVERY DAY 12/28/22   Deeann Saint, MD  omeprazole (PRILOSEC) 20 MG capsule Take 1 capsule (20 mg total) by mouth daily. 07/02/22   Deeann Saint, MD  pantoprazole (PROTONIX) 40 MG tablet Take 1 tablet (40 mg total) by mouth 2 (two) times daily. 08/25/22   Small, Brooke L, PA  senna-docusate (SENOKOT-S) 8.6-50 MG tablet Take 1 tablet by mouth daily. 12/01/22   Deeann Saint, MD  tiZANidine (ZANAFLEX) 4 MG tablet TAKE 1 TABLET BY  MOUTH AT BEDTIME AS NEEDED FOR MUSCLE SPASMS. 11/01/22   Deeann Saint, MD  bismuth-metronidazole-tetracycline [facility]) [phone] MG capsule Take 3 capsules by mouth 4 (four) times daily -  before meals and at bedtime for 10 days. 08/13/19 08/20/20  Rachael Fee, MD  fluticasone (FLONASE) 50 MCG/ACT nasal spray PLACE 1 SPRAY INTO BOTH NOSTRILS DAILY. 08/08/19 08/20/20  Deeann Saint, MD    Family History Family History  Problem Relation Age of Onset   Hyperlipidemia Mother    Hypertension Mother    Hypertension Father    Hyperlipidemia Father    Diabetes Father    Cancer Sister        ovarian   Heart attack Neg Hx    Sudden death  Neg Hx     Social History Social History   Tobacco Use   Smoking status: Some Days    Packs/day: .25    Types: Cigarettes   Smokeless tobacco: Never  Vaping Use   Vaping Use: Every day  Substance Use Topics   Alcohol use: Yes    Alcohol/week: 7.0 standard drinks of alcohol    Types: 7 Glasses of wine per week   Drug use: No     Allergies   Patient has no known allergies.   Review of Systems Review of Systems Per HPI  Physical Exam Triage Vital Signs ED Triage Vitals  Enc Vitals Group     BP 01/31/23 1455 (!) 155/90     Pulse Rate 01/31/23 1455 69     Resp 01/31/23 1455 18     Temp 01/31/23 1455 97.8 F (36.6 C)     Temp Source 01/31/23 1455 Oral     SpO2 01/31/23 1455 98 %     Weight --      Height --      Head Circumference --      Peak Flow --      Pain Score 01/31/23 1457 8     Pain Loc --      Pain Edu? --      Excl. in GC? --    No data found.  Updated Vital Signs BP (!) 155/90 (BP Location: Left Arm)   Pulse 69   Temp 97.8 F (36.6 C) (Oral)   Resp 18   SpO2 98%   Visual Acuity Right Eye Distance:   Left Eye Distance:   Bilateral Distance:    Right Eye Near:   Left Eye Near:    Bilateral Near:     Physical Exam Constitutional:      General: She is not in acute distress.    Appearance: Normal appearance. She is not toxic-appearing or diaphoretic.  HENT:     Head: Normocephalic and atraumatic.  Eyes:     Extraocular Movements: Extraocular movements intact.     Conjunctiva/sclera: Conjunctivae normal.  Pulmonary:     Effort: Pulmonary effort is normal.  Musculoskeletal:     Comments: Patient has tenderness to palpation throughout anterior left knee.  She also has significant tenderness to palpation to medial left knee with protruding and diffuse prominent veins throughout this area.  Patient is able to bear weight but with difficulty.  No crepitus noted.  Full range of motion of knee present.  Patient has circumferential swelling  throughout the knee as well.  No tenderness or swelling to calf.  Appears neurovascularly intact.  Neurological:     General: No focal deficit present.     Mental Status: She is alert  and oriented to person, place, and time. Mental status is at baseline.  Psychiatric:        Mood and Affect: Mood normal.        Behavior: Behavior normal.        Thought Content: Thought content normal.        Judgment: Judgment normal.      UC Treatments / Results  Labs (all labs ordered are listed, but only abnormal results are displayed) Labs Reviewed  POCT URINALYSIS DIP (MANUAL ENTRY) - Abnormal; Notable for the following components:      Result Value   Bilirubin, UA moderate (*)    Blood, UA moderate (*)    Leukocytes, UA Small (1+) (*)    All other components within normal limits  URINE CULTURE    EKG   Radiology No results found.  Procedures Procedures (including critical care time)  Medications Ordered in UC Medications - No data to display  Initial Impression / Assessment and Plan / UC Course  I have reviewed the triage vital signs and the nursing notes.  Pertinent labs & imaging results that were available during my care of the patient were reviewed by me and considered in my medical decision making (see chart for details).     1.  UTI  UA indicating UTI so will treat with cephalexin antibiotic.  Urine culture is pending.  Advised supportive care including ensuring adequate fluid hydration.  Advised patient to follow-up if any symptoms persist or worsen.  2.  Left knee pain  Given diffuse swelling and prominent veins, I am concerned for DVT.  Given time of day, do not have DVT ultrasound capabilities here in urgent care or ability to order them on an outpatient basis.  Therefore, recommended prompt evaluation at the emergency department today for further evaluation and management of the left knee pain.  Patient was agreeable to this plan.  Vital signs stable at discharge.   Agree with patient self transport to the ER. Final Clinical Impressions(s) / UC Diagnoses   Final diagnoses:  Acute cystitis with hematuria  Dysuria  Acute pain of left knee     Discharge Instructions      I sent you an antibiotic for UTI.  Urine culture is pending.  Will call with results.  I am concerned you could have a blood clot in your leg so please go to the emergency department for further evaluation and management of your left knee pain.    ED Prescriptions     Medication Sig Dispense Auth. Provider   cephALEXin (KEFLEX) 500 MG capsule Take 1 capsule (500 mg total) by mouth 2 (two) times daily for 7 days. 14 capsule Thornton, Acie Fredrickson, [place]      PDMP not reviewed this encounter.   Gustavus Bryant, [place] 01/31/23 [phone]

## 2023-01-31 NOTE — Discharge Instructions (Signed)
I sent you an antibiotic for UTI.  Urine culture is pending.  Will call with results.  I am concerned you could have a blood clot in your leg so please go to the emergency department for further evaluation and management of your left knee pain.

## 2023-01-31 NOTE — ED Provider Notes (Signed)
[place] EMERGENCY DEPARTMENT AT [facility] Provider Note   CSN: [id number] Arrival date & time: 01/31/23  1638     History  Chief Complaint  Patient presents with   Knee Pain    Lauren Boyle is a 57 y.o. female.  With a history of arthritis, anxiety, depression, anemia who presents to the ED for evaluation of left knee pain and swelling.  This began approximately 3 days ago.  She denies any recent trauma, injuries or falls.  No numbness, weakness or tingling.  Pain is constant and described as an ache.  It is worse with ambulation.  No fevers or chills.  She has not taken anything for her pain.  She denies any recent long distance travel, cancer treatments, surgeries.  No calf pain.   Knee Pain      Home Medications Prior to Admission medications   Medication Sig Start Date End Date Taking? Authorizing Provider  albuterol (VENTOLIN HFA) 108 (90 Base) MCG/ACT inhaler Inhale 1-2 puffs into the lungs every 6 (six) hours as needed for wheezing or shortness of breath. Patient not taking: Reported on 08/27/2022 08/25/22   Small, Brooke L, PA  buPROPion (WELLBUTRIN SR) 150 MG 12 hr tablet TAKE 1 TABLET BY MOUTH EVERY DAY 12/28/22   Deeann Saint, MD  cephALEXin (KEFLEX) 500 MG capsule Take 1 capsule (500 mg total) by mouth 2 (two) times daily for 7 days. 01/31/23 02/07/23  Gustavus Bryant, FNP  omeprazole (PRILOSEC) 20 MG capsule Take 1 capsule (20 mg total) by mouth daily. 07/02/22   Deeann Saint, MD  pantoprazole (PROTONIX) 40 MG tablet Take 1 tablet (40 mg total) by mouth 2 (two) times daily. 08/25/22   Small, Brooke L, PA  senna-docusate (SENOKOT-S) 8.6-50 MG tablet Take 1 tablet by mouth daily. 12/01/22   Deeann Saint, MD  tiZANidine (ZANAFLEX) 4 MG tablet TAKE 1 TABLET BY MOUTH AT BEDTIME AS NEEDED FOR MUSCLE SPASMS. 11/01/22   Deeann Saint, MD  bismuth-metronidazole-tetracycline [facility]) [phone] MG capsule Take 3 capsules by mouth 4 (four) times daily -  before  meals and at bedtime for 10 days. 08/13/19 08/20/20  Rachael Fee, MD  fluticasone (FLONASE) 50 MCG/ACT nasal spray PLACE 1 SPRAY INTO BOTH NOSTRILS DAILY. 08/08/19 08/20/20  Deeann Saint, MD      Allergies    Patient has no known allergies.    Review of Systems   Review of Systems  Musculoskeletal:  Positive for arthralgias and joint swelling.  All other systems reviewed and are negative.   Physical Exam Updated Vital Signs BP (!) 120/100   Pulse 66   Temp 98.5 F (36.9 C)   Resp 17   Ht 5\' 5"  (1.651 m)   Wt 85.7 kg   SpO2 95%   BMI 31.45 kg/m  Physical Exam Vitals and nursing note reviewed.  Constitutional:      General: She is not in acute distress.    Appearance: Normal appearance. She is normal weight. She is not ill-appearing.  HENT:     Head: Normocephalic and atraumatic.  Pulmonary:     Effort: Pulmonary effort is normal. No respiratory distress.  Abdominal:     General: Abdomen is flat.  Musculoskeletal:        General: Normal range of motion.     Cervical back: Neck supple.     Comments: Moderate swelling of the left knee.  TTP along the medial aspect.  Negative anterior and posterior drawer  test.  Negative varus valgus stress test.  DP pulses 2+ bilaterally.  Sensation intact in all digits.  No calf pain, tenderness or swelling..  Skin:    General: Skin is warm and dry.  Neurological:     Mental Status: She is alert and oriented to person, place, and time.  Psychiatric:        Mood and Affect: Mood normal.        Behavior: Behavior normal.     ED Results / Procedures / Treatments   Labs (all labs ordered are listed, but only abnormal results are displayed) Labs Reviewed - No data to display  EKG None  Radiology DG Knee Complete 4 Views Left  Result Date: 01/31/2023 CLINICAL DATA:  Acute left knee pain without known injury. EXAM: LEFT KNEE - COMPLETE 4+ VIEW COMPARISON:  February 06, 2017. FINDINGS: Status post left total knee arthroplasty. No  fracture or dislocation is noted. No effusion is noted. IMPRESSION: No acute abnormality seen. Electronically Signed   By: Lupita Raider M.D.   On: 01/31/2023 18:23    Procedures Procedures    Medications Ordered in ED Medications - No data to display  ED Course/ Medical Decision Making/ A&P                             Medical Decision Making This patient presents to the ED for concern of left knee pain, this involves an extensive number of treatment options, and is a complaint that carries with it a high risk of complications and morbidity.  The differential diagnosis includes fracture, strain, sprain, contusion, dislocation, DVT, septic arthritis, hardware infection  Co morbidities that complicate the patient evaluation   left TKA on 05/13/2017  My initial workup includes x-ray, ultrasound  Additional history obtained from: Nursing notes from this visit.  I ordered imaging studies including x-ray left knee, DVT study left lower extremity I independently visualized and interpreted imaging which showed x-ray left knee was negative for osseous abnormalities.  No effusions. I agree with the radiologist interpretation  57 year old female presenting to the ED for evaluation of left knee pain.  She was sent to the urgent care for rule out of a DVT.  Her x-ray was negative for acute findings.  Did not show any effusions.  Her physical exam is reassuring.  Patient eloped from the emergency department without notifying staff.  This was prior to her ultrasound being performed.  I then called the patient and spoke with her on the phone.  She states she is tired and does not want to wait any longer.  I informed her of the dangers of leaving the emergency department with further workup being completed including threat to life and limb.  She states she will return tomorrow.  I encouraged her to return tonight to continue her workup for sooner if her symptoms worsen.  Note: Portions of this report  may have been transcribed using voice recognition software. Every effort was made to ensure accuracy; however, inadvertent computerized transcription errors may still be present.        Final Clinical Impression(s) / ED Diagnoses Final diagnoses:  Acute pain of left knee    Rx / DC Orders ED Discharge Orders     None         Mora Bellman 01/31/23 2012    Arby Barrette, MD 02/03/23 1413

## 2023-01-31 NOTE — ED Provider Triage Note (Cosign Needed)
Emergency Medicine Provider Triage Evaluation Note  Lauren Boyle , a 57 y.o. female  was evaluated in triage.  Pt complains of left knee pain x 3 days. She reports pain with walking and range of motions.  Review of Systems  Positive: Swelling and tenderness to left knee Negative: History of coagulopathy, loss of sensation  Physical Exam  BP (!) 120/100   Pulse 66   Temp 98.5 F (36.9 C)   Resp 17   Ht 5\' 5"  (1.651 m)   Wt 85.7 kg   SpO2 95%   BMI 31.45 kg/m  Gen:   Awake, no distress   Resp:  Normal effort  MSK:   Able to ambulate, moves extremities Other:  Swelling to medial and anterior left knee  Medical Decision Making  Medically screening exam initiated at 5:59 PM.  Appropriate orders placed.  Lauren Boyle was informed that the remainder of the evaluation will be completed by another provider, this initial triage assessment does not replace that evaluation, and the importance of remaining in the ED until their evaluation is complete.   Maxwell Marion, PA-C 01/31/23 [phone]

## 2023-01-31 NOTE — ED Notes (Signed)
Patient told staff she was leaving. When questioned about

## 2023-01-31 NOTE — ED Triage Notes (Signed)
Pt reports left knee pain and swelling a few days ago. States today it was hard for her to get out of bed due to it. Sent for blood clot rule out and xray. Denies any injury or bites.

## 2023-01-31 NOTE — Progress Notes (Signed)
Lower extremity venous attempted. Per staff, patient left AMA.    Jean Rosenthal, RDMS, RVT

## 2023-02-01 ENCOUNTER — Other Ambulatory Visit: Payer: Self-pay | Admitting: Family Medicine

## 2023-02-01 DIAGNOSIS — R6882 Decreased libido: Secondary | ICD-10-CM

## 2023-02-01 LAB — URINE CULTURE: Culture: >=100000 — AB

## 2023-02-02 LAB — URINE CULTURE

## 2023-02-12 ENCOUNTER — Other Ambulatory Visit: Payer: Self-pay | Admitting: Family Medicine

## 2023-02-12 DIAGNOSIS — G8929 Other chronic pain: Secondary | ICD-10-CM

## 2023-03-13 ENCOUNTER — Other Ambulatory Visit: Payer: Self-pay | Admitting: Family Medicine

## 2023-03-13 DIAGNOSIS — R6882 Decreased libido: Secondary | ICD-10-CM

## 2023-03-29 ENCOUNTER — Other Ambulatory Visit: Payer: Self-pay | Admitting: Family Medicine

## 2023-03-29 DIAGNOSIS — R1310 Dysphagia, unspecified: Secondary | ICD-10-CM

## 2023-03-29 DIAGNOSIS — M545 Low back pain, unspecified: Secondary | ICD-10-CM

## 2023-04-06 ENCOUNTER — Other Ambulatory Visit: Payer: Self-pay | Admitting: Family Medicine

## 2023-04-06 DIAGNOSIS — G8929 Other chronic pain: Secondary | ICD-10-CM

## 2023-04-06 DIAGNOSIS — R6882 Decreased libido: Secondary | ICD-10-CM

## 2023-04-06 DIAGNOSIS — M545 Low back pain, unspecified: Secondary | ICD-10-CM

## 2023-04-08 MED ORDER — BUPROPION HCL ER (SR) 150 MG PO TB12
150.0000 mg | ORAL_TABLET | Freq: Every day | ORAL | 0 refills | Status: DC
Start: 2023-04-08 — End: 2023-12-14

## 2023-04-14 ENCOUNTER — Other Ambulatory Visit: Payer: Self-pay | Admitting: Family Medicine

## 2023-04-14 DIAGNOSIS — R1310 Dysphagia, unspecified: Secondary | ICD-10-CM

## 2023-04-25 ENCOUNTER — Other Ambulatory Visit: Payer: Self-pay | Admitting: Family Medicine

## 2023-04-25 DIAGNOSIS — M545 Low back pain, unspecified: Secondary | ICD-10-CM

## 2023-04-25 DIAGNOSIS — G8929 Other chronic pain: Secondary | ICD-10-CM

## 2023-05-06 ENCOUNTER — Encounter: Payer: 59 | Admitting: Family Medicine

## 2023-07-06 ENCOUNTER — Encounter: Payer: 59 | Admitting: Family Medicine

## 2023-07-06 DIAGNOSIS — R109 Unspecified abdominal pain: Secondary | ICD-10-CM | POA: Insufficient documentation

## 2023-07-06 DIAGNOSIS — N838 Other noninflammatory disorders of ovary, fallopian tube and broad ligament: Secondary | ICD-10-CM | POA: Insufficient documentation

## 2023-07-06 DIAGNOSIS — N951 Menopausal and female climacteric states: Secondary | ICD-10-CM | POA: Insufficient documentation

## 2023-07-06 DIAGNOSIS — N390 Urinary tract infection, site not specified: Secondary | ICD-10-CM | POA: Insufficient documentation

## 2023-07-06 DIAGNOSIS — R1900 Intra-abdominal and pelvic swelling, mass and lump, unspecified site: Secondary | ICD-10-CM | POA: Insufficient documentation

## 2023-07-06 DIAGNOSIS — N941 Unspecified dyspareunia: Secondary | ICD-10-CM | POA: Insufficient documentation

## 2023-08-08 ENCOUNTER — Encounter: Payer: Self-pay | Admitting: Emergency Medicine

## 2023-08-08 ENCOUNTER — Ambulatory Visit (INDEPENDENT_AMBULATORY_CARE_PROVIDER_SITE_OTHER): Payer: 59

## 2023-08-08 ENCOUNTER — Ambulatory Visit
Admission: EM | Admit: 2023-08-08 | Discharge: 2023-08-08 | Disposition: A | Discharge status: Home / Self Care | Payer: 59 | Attending: Family Medicine | Admitting: Family Medicine

## 2023-08-08 DIAGNOSIS — M25511 Pain in right shoulder: Secondary | ICD-10-CM

## 2023-08-08 DIAGNOSIS — M898X1 Other specified disorders of bone, shoulder: Secondary | ICD-10-CM

## 2023-08-08 MED ORDER — DEXAMETHASONE SODIUM PHOSPHATE 10 MG/ML IJ SOLN
10.0000 mg | Freq: Once | INTRAMUSCULAR | Status: AC
  Administered 2023-08-08: 10 mg via INTRAMUSCULAR

## 2023-08-08 MED ORDER — DICLOFENAC SODIUM 75 MG PO TBEC: 75.0000 mg | DELAYED_RELEASE_TABLET | Freq: Two times a day (BID) | ORAL | 0 refills | Status: DC

## 2023-08-08 MED ORDER — HYDROCODONE-ACETAMINOPHEN 5-325 MG PO TABS: 1.0000 | ORAL_TABLET | Freq: Four times a day (QID) | ORAL | 0 refills | Status: DC | PRN

## 2023-08-08 NOTE — Discharge Instructions (Addendum)
 Be aware, you have been prescribed pain medications that may cause drowsiness. While taking this medication, do not take any other medications containing acetaminophen  (Tylenol ). Do not combine with alcohol or recreational drugs. Please do not drive, operate heavy machinery, or take part in activities that require making important decisions while on this medication as your judgement may be clouded.  Meds ordered this encounter  Medications   dexamethasone  (DECADRON ) injection 10 mg   HYDROcodone -acetaminophen  (NORCO/VICODIN) 5-325 MG tablet    Sig: Take 1 tablet by mouth every 6 (six) hours as needed for moderate pain (pain score 4-6) or severe pain (pain score 7-10).    Dispense:  8 tablet    Refill:  0   diclofenac  (VOLTAREN ) 75 MG EC tablet    Sig: Take 1 tablet (75 mg total) by mouth 2 (two) times daily.    Dispense:  14 tablet    Refill:  0

## 2023-08-08 NOTE — ED Triage Notes (Signed)
 Right shoulder pain. Started on  when pt slipped outside playing w/dogs. Fell on shoulder. No pain at first. Then, a few weeks ago started aching real bad and began swelling. Pt thinks she has an elevated bone sticking out in her shoulder. As of today she is having a hard time elevating arm.

## 2023-10-17 ENCOUNTER — Ambulatory Visit: Admitting: Family Medicine

## 2023-10-20 ENCOUNTER — Ambulatory Visit (INDEPENDENT_AMBULATORY_CARE_PROVIDER_SITE_OTHER): Admitting: Family Medicine

## 2023-10-20 ENCOUNTER — Ambulatory Visit (INDEPENDENT_AMBULATORY_CARE_PROVIDER_SITE_OTHER)

## 2023-10-20 VITALS — BP 140/90 | HR 74 | Temp 98.3°F | Wt 205.0 lb

## 2023-10-20 DIAGNOSIS — G8929 Other chronic pain: Secondary | ICD-10-CM

## 2023-10-20 DIAGNOSIS — S43101A Unspecified dislocation of right acromioclavicular joint, initial encounter: Secondary | ICD-10-CM

## 2023-10-20 DIAGNOSIS — M545 Low back pain, unspecified: Secondary | ICD-10-CM

## 2023-10-20 DIAGNOSIS — R319 Hematuria, unspecified: Secondary | ICD-10-CM | POA: Diagnosis not present

## 2023-10-20 DIAGNOSIS — R3989 Other symptoms and signs involving the genitourinary system: Secondary | ICD-10-CM

## 2023-10-20 DIAGNOSIS — R600 Localized edema: Secondary | ICD-10-CM | POA: Diagnosis not present

## 2023-10-20 DIAGNOSIS — M25561 Pain in right knee: Secondary | ICD-10-CM | POA: Diagnosis not present

## 2023-10-20 LAB — POC URINALSYSI DIPSTICK (AUTOMATED)
Bilirubin, UA: NEGATIVE
Blood, UA: POSITIVE
Glucose, UA: NEGATIVE
Ketones, UA: NEGATIVE
Nitrite, UA: POSITIVE
Protein, UA: POSITIVE — AB
Spec Grav, UA: 1.025 (ref 1.010–1.025)
Urobilinogen, UA: 0.2 U/dL
pH, UA: 6 (ref 5.0–8.0)

## 2023-10-20 MED ORDER — NITROFURANTOIN MONOHYD MACRO 100 MG PO CAPS: 100.0000 mg | ORAL_CAPSULE | Freq: Two times a day (BID) | ORAL | 0 refills | Status: AC

## 2023-10-20 MED ORDER — TRAMADOL HCL 50 MG PO TABS: 50.0000 mg | ORAL_TABLET | Freq: Three times a day (TID) | ORAL | 0 refills | Status: AC | PRN

## 2023-10-20 NOTE — Progress Notes (Signed)
 Established Patient Office Visit   Subjective  Patient ID: Lauren Boyle, female    DOB: 04-28-1966  Age: 58 y.o. MRN: [id number]  No chief complaint on file.   Patient is a 58 year old female seen for acute concerns.  Pt with leg swelling and pain, shoulder pain, and urinary symptoms.  She has experienced significant swelling and pain in her right leg over the past two months. The swelling involves the entire leg and is described as 'so tight' and painful, especially when standing for long periods at work. She has not used compression socks. A fall two months ago resulted in her landing on her knee, leading to swelling that began a couple of weeks post-fall. She reports a sensation of the knee being 'stuck' and requiring a 'pop' to move, with significant pain and instability. She has a history of knee surgery on the opposite knee and notes similar symptoms in the current knee.  She reports shoulder pain, particularly when lifting her arm, with a sensation of numbness and a 'real bad pull'. She has to manually support her arm to move it and has difficulty with tasks such as snapping her bra.  She has urinary symptoms, including blood in her urine noted during a drug test in January, and recent onset of pressure and pain when urinating. The urine is described as dark yellow, especially when not drinking enough water. She reports increased urinary frequency and a sensation of incomplete bladder emptying. She has experienced back pain, which she describes as moving and affecting both sides, with more pressure on one side. No nausea or vomiting. She mentions a history of urinary tract infections and notes a previous 59% blood level in her urine.  She reports significant weight gain over the past few months, with her weight now at 205 pounds.   Patient Active Problem List   Diagnosis Date Noted   Abdominal pain 07/06/2023   Lower urinary tract infectious disease 07/06/2023   Mass of ovary  07/06/2023   Menopausal symptom 07/06/2023   Pelvic mass 07/06/2023   Dyspareunia, female 07/06/2023   Low back pain 07/04/2013   Past Medical History:  Diagnosis Date   Arthritis    Chicken pox    Depression    History of meniscal tear    left   Iron deficiency    Obesity    Osteoarthritis of knee    left   Past Surgical History:  Procedure Laterality Date   ABDOMINAL HYSTERECTOMY     partial   CHONDROPLASTY Left 09/17/2016   Procedure: CHONDROPLASTY;  Surgeon: Frederico Hamman, MD;  Location: [facility];  Service: Orthopedics;  Laterality: Left;   COLONOSCOPY     KNEE ARTHROSCOPY WITH MEDIAL MENISECTOMY Left 09/17/2016   Procedure: KNEE ARTHROSCOPY WITH PARTIAL MEDIAL MENISECTOMY;  Surgeon: Frederico Hamman, MD;  Location: [facility];  Service: Orthopedics;  Laterality: Left;   ORIF ULNAR FRACTURE Left 04/14/2015   Procedure: OPEN REDUCTION INTERNAL FIXATION (ORIF) ULNAR FRACTURE;  Surgeon: Eldred Manges, MD;  Location: [facility] OR;  Service: Orthopedics;  Laterality: Left;   TOTAL KNEE ARTHROPLASTY Left 05/13/2017   Procedure: LEFT TOTAL KNEE ARTHROPLASTY;  Surgeon: Frederico Hamman, MD;  Location: [facility];  Service: Orthopedics;  Laterality: Left;   Social History   Tobacco Use   Smoking status: Former    Current packs/day: 0.25    Types: Cigarettes    Passive exposure: Past   Smokeless tobacco: Never  Vaping Use   Vaping status:  Every Day  Substance Use Topics   Alcohol use: Yes    Alcohol/week: 7.0 standard drinks of alcohol    Types: 7 Glasses of wine per week    Comment: Socially   Drug use: No   Family History  Problem Relation Age of Onset   Hyperlipidemia Mother    Hypertension Mother    Hypertension Father    Hyperlipidemia Father    Diabetes Father    Cancer Sister        ovarian   Heart attack Neg Hx    Sudden death Neg Hx    Allergies  Allergen Reactions   Penicillins Itching    Patient was able to tolerate a penicillin shot  on 01/22/18 at urgent care.  Patient states she had "severe itching" reaction to shot of Bicillin.  No difficulty breathing, no throat swelling.  Reaction occurred within 30 minutes to an hour.    Other Other (See Comments)      ROS Negative unless stated above    Objective:     BP (!) 140/90 (BP Location: Left Arm, Patient Position: Sitting, Cuff Size: Normal)   Pulse 74   Temp 98.3 F (36.8 C) (Oral)   Wt 205 lb (93 kg)   SpO2 97%   BMI 34.11 kg/m  BP Readings from Last 3 Encounters:  10/20/23 (!) 140/90  08/08/23 (!) 132/94  01/31/23 (!) 120/100   Wt Readings from Last 3 Encounters:  10/20/23 205 lb (93 kg)  08/08/23 188 lb 15 oz (85.7 kg)  01/31/23 189 lb (85.7 kg)      Physical Exam Constitutional:      General: She is not in acute distress.    Appearance: Normal appearance.  HENT:     Head: Normocephalic and atraumatic.     Nose: Nose normal.     Mouth/Throat:     Mouth: Mucous membranes are moist.  Cardiovascular:     Rate and Rhythm: Normal rate and regular rhythm.     Heart sounds: Normal heart sounds. No murmur heard.    No gallop.  Pulmonary:     Effort: Pulmonary effort is normal. No respiratory distress.     Breath sounds: Normal breath sounds. No wheezing, rhonchi or rales.  Abdominal:     Tenderness: There is left CVA tenderness.  Skin:    General: Skin is warm and dry.  Neurological:     Mental Status: She is alert and oriented to person, place, and time.     Results for orders placed or performed in visit on 10/20/23  POCT Urinalysis Dipstick (Automated)  Result Value Ref Range   Color, UA yellow    Clarity, UA cloudy    Glucose, UA Negative Negative   Bilirubin, UA negative    Ketones, UA negative    Spec Grav, UA 1.025 1.010 - 1.025   Blood, UA Positive    pH, UA 6.0 5.0 - 8.0   Protein, UA Positive (A) Negative   Urobilinogen, UA 0.2 0.2 or 1.0 E.U./dL   Nitrite, UA Positive    Leukocytes, UA Large (3+) (A) Negative       Assessment & Plan:  Hematuria, unspecified type -     POCT Urinalysis Dipstick (Automated) -     Urine Culture; Future -     Comprehensive metabolic panel with GFR -     CBC with Differential/Platelet -     CT RENAL STONE STUDY; Future  Chronic left-sided low back pain without sciatica -  Comprehensive metabolic panel with GFR -     CBC with Differential/Platelet -     traMADol HCl; Take 1 tablet (50 mg total) by mouth every 8 (eight) hours as needed for up to 5 days.  Dispense: 15 tablet; Refill: 0 -     CT RENAL STONE STUDY; Future  Separation of right acromioclavicular joint, initial encounter -     DG Shoulder Right; Future -     traMADol HCl; Take 1 tablet (50 mg total) by mouth every 8 (eight) hours as needed for up to 5 days.  Dispense: 15 tablet; Refill: 0 -     Ambulatory referral to Orthopedic Surgery  Edema, lower extremity -     D-dimer, quantitative  Chronic pain of right knee -     CBC with Differential/Platelet -     traMADol HCl; Take 1 tablet (50 mg total) by mouth every 8 (eight) hours as needed for up to 5 days.  Dispense: 15 tablet; Refill: 0 -     MR KNEE RIGHT WO CONTRAST; Future -     Ambulatory referral to Orthopedic Surgery  Suspected UTI -     Nitrofurantoin Monohyd Macro; Take 1 capsule (100 mg total) by mouth 2 (two) times daily for 7 days.  Dispense: 14 capsule; Refill: 0  Right knee pain and swelling   Right knee pain and swelling post-fall with instability and popping suggest ligament or cartilage tear. Differential includes ligament tear, cartilage tear, or structural damage. Surgery may be needed if conservative measures fail. Order x-ray of the right knee to assess displacement and structural damage. Consider MRI if x-ray is inconclusive or instability persists.  Right shoulder pain and deformity   Right shoulder pain and deformity with limited range of motion and numbness post-fall suggest possible ligament tear with clavicle elevation.  Conservative management with a sling was not tolerated at work. Order x-ray of the right shoulder to assess displacement.  Heat, ice, topical analgesics, Tylenol or ibuprofen.  Urinary tract infection (UTI) with possible kidney involvement   Recurrent UTI with hematuria and possible kidney involvement suggests kidney stones or ascending infection. Differential includes kidney stones causing obstruction and infection. Order urine culture to identify causative organism and antibiotic sensitivity. Prescribe antibiotics for UTI.  Pt with h/o MDR organism on prior Ucx.   Order CT stone study.  Leg swelling and pain   Leg swelling and pain possibly related to prolonged standing at work. Differential includes deep vein thrombosis (DVT) or other vascular issues. Order blood work including D-dimer to assess for possible blood clot. Recommend wearing compression stockings to reduce swelling.  Will order Doppler if needed.  Return if symptoms worsen or fail to improve.   Deeann Saint, MD

## 2023-10-21 LAB — CBC WITH DIFFERENTIAL/PLATELET
Basophils Absolute: 0.1 10*3/uL (ref 0.0–0.1)
Basophils Relative: 1.1 % (ref 0.0–3.0)
Eosinophils Absolute: 0.3 10*3/uL (ref 0.0–0.7)
Eosinophils Relative: 3.1 % (ref 0.0–5.0)
HCT: 39.7 % (ref 36.0–46.0)
Hemoglobin: 12.8 g/dL (ref 12.0–15.0)
Lymphocytes Relative: 37.4 % (ref 12.0–46.0)
Lymphs Abs: 3.1 10*3/uL (ref 0.7–4.0)
MCHC: 32.3 g/dL (ref 30.0–36.0)
MCV: 87.5 fl (ref 78.0–100.0)
Monocytes Absolute: 0.8 10*3/uL (ref 0.1–1.0)
Monocytes Relative: 8.9 % (ref 3.0–12.0)
Neutro Abs: 4.2 10*3/uL (ref 1.4–7.7)
Neutrophils Relative %: 49.5 % (ref 43.0–77.0)
Platelets: 464 10*3/uL — ABNORMAL HIGH (ref 150.0–400.0)
RBC: 4.53 Mil/uL (ref 3.87–5.11)
RDW: 13.8 % (ref 11.5–15.5)
WBC: 8.4 10*3/uL (ref 4.0–10.5)

## 2023-10-21 LAB — COMPREHENSIVE METABOLIC PANEL WITH GFR
ALT: 14 U/L (ref 0–35)
AST: 16 U/L (ref 0–37)
Albumin: 4 g/dL (ref 3.5–5.2)
Alkaline Phosphatase: 61 U/L (ref 39–117)
BUN: 10 mg/dL (ref 6–23)
CO2: 27 meq/L (ref 19–32)
Calcium: 9.5 mg/dL (ref 8.4–10.5)
Chloride: 107 meq/L (ref 96–112)
Creatinine, Ser: 0.73 mg/dL (ref 0.40–1.20)
GFR: 89.93 mL/min (ref >60.00)
Glucose, Bld: 72 mg/dL (ref 70–99)
Potassium: 4 meq/L (ref 3.5–5.1)
Sodium: 143 meq/L (ref 135–145)
Total Bilirubin: 0.5 mg/dL (ref 0.2–1.2)
Total Protein: 6.7 g/dL (ref 6.0–8.3)

## 2023-10-21 LAB — D-DIMER, QUANTITATIVE: D-Dimer, Quant: 0.5 ug{FEU}/mL — ABNORMAL HIGH (ref <0.50)

## 2023-10-23 LAB — URINE CULTURE
MICRO NUMBER:: 16256511
SPECIMEN QUALITY:: ADEQUATE

## 2023-10-24 ENCOUNTER — Telehealth (INDEPENDENT_AMBULATORY_CARE_PROVIDER_SITE_OTHER): Admitting: Family Medicine

## 2023-10-24 ENCOUNTER — Other Ambulatory Visit: Payer: Self-pay | Admitting: Family Medicine

## 2023-10-24 ENCOUNTER — Encounter: Payer: Self-pay | Admitting: Family Medicine

## 2023-10-24 DIAGNOSIS — N3001 Acute cystitis with hematuria: Secondary | ICD-10-CM | POA: Diagnosis not present

## 2023-10-24 DIAGNOSIS — M25511 Pain in right shoulder: Secondary | ICD-10-CM | POA: Diagnosis not present

## 2023-10-24 DIAGNOSIS — N3000 Acute cystitis without hematuria: Secondary | ICD-10-CM

## 2023-10-24 DIAGNOSIS — M25561 Pain in right knee: Secondary | ICD-10-CM

## 2023-10-24 DIAGNOSIS — G8929 Other chronic pain: Secondary | ICD-10-CM

## 2023-10-24 MED ORDER — SULFAMETHOXAZOLE-TRIMETHOPRIM 800-160 MG PO TABS: 1.0000 | ORAL_TABLET | Freq: Two times a day (BID) | ORAL | 0 refills | Status: DC

## 2023-10-24 NOTE — Progress Notes (Signed)
 Patient was unable to self-report due to a lack of equipment at home via telehealth

## 2023-10-24 NOTE — Progress Notes (Signed)
 Virtual Visit via Video Note  I connected with Lauren Boyle on 10/24/23 at  3:30 PM EDT by a video enabled telemedicine application and verified that I am speaking with the correct person using two identifiers.  Location patient: home Location provider:work or home office Persons participating in the virtual visit: patient, provider  I discussed the limitations of evaluation and management by telemedicine and the availability of in person appointments. The patient expressed understanding and agreed to proceed.   HPI: Pt is a 58 yo female seen for f/u.  States still having low back pain, but urine looks better, less dark and having less of the urinary discomfort.  Taking macrobid.  CT scheduled.  Has appt for Ortho for R shoulder and R knee.  Pt had questions about recent labs.   ROS: See pertinent positives and negatives per HPI.  Past Medical History:  Diagnosis Date   Arthritis    Chicken pox    Depression    History of meniscal tear    left   Iron deficiency    Obesity    Osteoarthritis of knee    left    Past Surgical History:  Procedure Laterality Date   ABDOMINAL HYSTERECTOMY     partial   CHONDROPLASTY Left 09/17/2016   Procedure: CHONDROPLASTY;  Surgeon: Frederico Hamman, MD;  Location: [facility];  Service: Orthopedics;  Laterality: Left;   COLONOSCOPY     KNEE ARTHROSCOPY WITH MEDIAL MENISECTOMY Left 09/17/2016   Procedure: KNEE ARTHROSCOPY WITH PARTIAL MEDIAL MENISECTOMY;  Surgeon: Frederico Hamman, MD;  Location: [facility];  Service: Orthopedics;  Laterality: Left;   ORIF ULNAR FRACTURE Left 04/14/2015   Procedure: OPEN REDUCTION INTERNAL FIXATION (ORIF) ULNAR FRACTURE;  Surgeon: Eldred Manges, MD;  Location: [facility] OR;  Service: Orthopedics;  Laterality: Left;   TOTAL KNEE ARTHROPLASTY Left 05/13/2017   Procedure: LEFT TOTAL KNEE ARTHROPLASTY;  Surgeon: Frederico Hamman, MD;  Location: [facility] OR;  Service: Orthopedics;  Laterality: Left;     Family History  Problem Relation Age of Onset   Hyperlipidemia Mother    Hypertension Mother    Hypertension Father    Hyperlipidemia Father    Diabetes Father    Cancer Sister        ovarian   Heart attack Neg Hx    Sudden death Neg Hx      Current Outpatient Medications:    albuterol (VENTOLIN HFA) 108 (90 Base) MCG/ACT inhaler, Inhale 1-2 puffs into the lungs every 6 (six) hours as needed for wheezing or shortness of breath., Disp: 18 g, Rfl: 0   buPROPion (WELLBUTRIN SR) 150 MG 12 hr tablet, Take 1 tablet (150 mg total) by mouth daily., Disp: 90 tablet, Rfl: 0   diclofenac (VOLTAREN) 75 MG EC tablet, Take 1 tablet (75 mg total) by mouth 2 (two) times daily., Disp: 14 tablet, Rfl: 0   nitrofurantoin, macrocrystal-monohydrate, (MACROBID) 100 MG capsule, Take 1 capsule (100 mg total) by mouth 2 (two) times daily for 7 days., Disp: 14 capsule, Rfl: 0   traMADol (ULTRAM) 50 MG tablet, Take 1 tablet (50 mg total) by mouth every 8 (eight) hours as needed for up to 5 days., Disp: 15 tablet, Rfl: 0   HYDROcodone-acetaminophen (NORCO/VICODIN) 5-325 MG tablet, Take 1 tablet by mouth every 6 (six) hours as needed for moderate pain (pain score 4-6) or severe pain (pain score 7-10). (Patient not taking: Reported on 10/24/2023), Disp: 8 tablet, Rfl: 0   linaclotide (LINZESS) 145 MCG  CAPS capsule, , Disp: , Rfl:    methocarbamol (ROBAXIN) 750 MG tablet, , Disp: , Rfl:    omeprazole (PRILOSEC) 20 MG capsule, TAKE 1 CAPSULE BY MOUTH EVERY DAY (Patient not taking: Reported on 10/24/2023), Disp: 30 capsule, Rfl: 0   pantoprazole (PROTONIX) 40 MG tablet, Take 1 tablet (40 mg total) by mouth 2 (two) times daily. (Patient not taking: Reported on 10/24/2023), Disp: 60 tablet, Rfl: 0   polyethylene glycol-electrolytes (NULYTELY) 420 g solution, , Disp: , Rfl:    senna-docusate (SENOKOT-S) 8.6-50 MG tablet, Take 1 tablet by mouth daily. (Patient not taking: Reported on 10/24/2023), Disp: 30 tablet, Rfl: 1    tiZANidine (ZANAFLEX) 4 MG tablet, TAKE 2 TABLETS (8 MG TOTAL) BY MOUTH 2 (TWO) TIMES DAILY AS NEEDED FOR MUSCLE SPASMS. (Patient not taking: Reported on 10/24/2023), Disp: 60 tablet, Rfl: 0  EXAM:  VITALS per patient if applicable: RR between 12-20 bpm  GENERAL: alert, oriented, appears well and in no acute distress  HEENT: atraumatic, conjunctiva clear, no obvious abnormalities on inspection of external nose and ears  NECK: normal movements of the head and neck  LUNGS: on inspection no signs of respiratory distress, breathing rate appears normal, no obvious gross SOB, gasping or wheezing  CV: no obvious cyanosis  MS: moves all visible extremities without noticeable abnormality  PSYCH/NEURO: pleasant and cooperative, no obvious depression or anxiety, speech and thought processing grossly intact  ASSESSMENT AND PLAN:  Discussed the following assessment and plan:  Acute cystitis with hematuria   Chronic pain of right knee  Chronic right shoulder pain  Reviewed results.  UCX with E. coli and Citrobacter koseri sensitive to Macrobid.  Continue ABX.  CT stone study still pending.  Continue increasing hydration.  Patient to follow-up with Ortho for chronic right knee and right shoulder pain s/p fall in Jan 2025.  X-ray right shoulder negative for [facility] joint separation.  Continue supportive care.  Given strict precautions.   I discussed the assessment and treatment plan with the patient. The patient was provided an opportunity to ask questions and all were answered. The patient agreed with the plan and demonstrated an understanding of the instructions.   The patient was advised to call back or seek an in-person evaluation if the symptoms worsen or if the condition fails to improve as anticipated.   Deeann Saint, MD

## 2023-10-25 ENCOUNTER — Ambulatory Visit (HOSPITAL_COMMUNITY)
Admission: RE | Admit: 2023-10-25 | Discharge: 2023-10-25 | Disposition: A | Discharge status: Home / Self Care | Source: Ambulatory Visit | Attending: Cardiology | Admitting: Cardiology

## 2023-10-25 ENCOUNTER — Other Ambulatory Visit: Payer: Self-pay | Admitting: Family Medicine

## 2023-10-25 DIAGNOSIS — M25561 Pain in right knee: Secondary | ICD-10-CM | POA: Insufficient documentation

## 2023-10-25 DIAGNOSIS — M545 Low back pain, unspecified: Secondary | ICD-10-CM

## 2023-10-25 DIAGNOSIS — G8929 Other chronic pain: Secondary | ICD-10-CM | POA: Diagnosis present

## 2023-10-25 DIAGNOSIS — S43101A Unspecified dislocation of right acromioclavicular joint, initial encounter: Secondary | ICD-10-CM

## 2023-10-27 ENCOUNTER — Other Ambulatory Visit

## 2023-10-27 ENCOUNTER — Other Ambulatory Visit: Payer: Self-pay | Admitting: Family Medicine

## 2023-10-27 ENCOUNTER — Encounter: Payer: Self-pay | Admitting: Family Medicine

## 2023-10-27 DIAGNOSIS — R3989 Other symptoms and signs involving the genitourinary system: Secondary | ICD-10-CM

## 2023-10-31 ENCOUNTER — Other Ambulatory Visit

## 2023-11-03 ENCOUNTER — Other Ambulatory Visit

## 2023-12-01 ENCOUNTER — Inpatient Hospital Stay: Admission: RE | Admit: 2023-12-01 | Source: Ambulatory Visit

## 2023-12-07 ENCOUNTER — Other Ambulatory Visit

## 2023-12-07 ENCOUNTER — Ambulatory Visit
Admission: RE | Admit: 2023-12-07 | Discharge: 2023-12-07 | Disposition: A | Discharge status: Home / Self Care | Source: Ambulatory Visit | Attending: Family Medicine | Admitting: Family Medicine

## 2023-12-07 DIAGNOSIS — G8929 Other chronic pain: Secondary | ICD-10-CM

## 2023-12-07 DIAGNOSIS — R319 Hematuria, unspecified: Secondary | ICD-10-CM

## 2023-12-08 ENCOUNTER — Telehealth: Admitting: Family Medicine

## 2023-12-08 ENCOUNTER — Encounter: Payer: Self-pay | Admitting: Family Medicine

## 2023-12-08 DIAGNOSIS — M25551 Pain in right hip: Secondary | ICD-10-CM | POA: Diagnosis not present

## 2023-12-08 DIAGNOSIS — N819 Female genital prolapse, unspecified: Secondary | ICD-10-CM | POA: Diagnosis not present

## 2023-12-08 DIAGNOSIS — M87051 Idiopathic aseptic necrosis of right femur: Secondary | ICD-10-CM

## 2023-12-08 DIAGNOSIS — M47819 Spondylosis without myelopathy or radiculopathy, site unspecified: Secondary | ICD-10-CM | POA: Diagnosis not present

## 2023-12-08 DIAGNOSIS — M25552 Pain in left hip: Secondary | ICD-10-CM

## 2023-12-08 MED ORDER — TRAMADOL HCL 50 MG PO TABS: 50.0000 mg | ORAL_TABLET | Freq: Three times a day (TID) | ORAL | 0 refills | Status: AC | PRN

## 2023-12-08 NOTE — Progress Notes (Signed)
 Virtual Visit via Video Note  I connected with Lauren Boyle on 12/08/23 at  4:30 PM EDT by a video enabled telemedicine application and verified that I am speaking with the correct person using two identifiers.  Location patient: home Location provider:work or home office Persons participating in the virtual visit: patient, provider  I discussed the limitations of evaluation and management by telemedicine and the availability of in person appointments. The patient expressed understanding and agreed to proceed. Chief Complaint  Patient presents with   Medical Management of Chronic Issues    CT RENAL STONE STUDY     HPI: Patient is a 58 year old female seen for follow-up and I'm, aging results.  Patient had CT stone study done yesterday 5/14.  No stones noted.  Degenerative changes seen.  Avascular necrosis of right hip reported. Pt with b/l hip and groin pain.  Pressure in bottom area and in groin.  Inquires about pain medication.  States sometimes difficult to get through work due to pain. Pressure with urination.  Pain with sex.  Feeling a bulge between legs/vagina.  ROS: See pertinent positives and negatives per HPI.  Past Medical History:  Diagnosis Date   Arthritis    Chicken pox    Depression    History of meniscal tear    left   Iron deficiency    Obesity    Osteoarthritis of knee    left    Past Surgical History:  Procedure Laterality Date   ABDOMINAL HYSTERECTOMY     partial   CHONDROPLASTY Left 09/17/2016   Procedure: CHONDROPLASTY;  Surgeon: Lauren Sima, MD;  Location: [facility];  Service: Orthopedics;  Laterality: Left;   COLONOSCOPY     KNEE ARTHROSCOPY WITH MEDIAL MENISECTOMY Left 09/17/2016   Procedure: KNEE ARTHROSCOPY WITH PARTIAL MEDIAL MENISECTOMY;  Surgeon: Lauren Sima, MD;  Location: [facility];  Service: Orthopedics;  Laterality: Left;   ORIF ULNAR FRACTURE Left 04/14/2015   Procedure: OPEN REDUCTION INTERNAL  FIXATION (ORIF) ULNAR FRACTURE;  Surgeon: Lauren Acron, MD;  Location: [facility] OR;  Service: Orthopedics;  Laterality: Left;   TOTAL KNEE ARTHROPLASTY Left 05/13/2017   Procedure: LEFT TOTAL KNEE ARTHROPLASTY;  Surgeon: Lauren Sima, MD;  Location: [facility] OR;  Service: Orthopedics;  Laterality: Left;    Family History  Problem Relation Age of Onset   Hyperlipidemia Mother    Hypertension Mother    Hypertension Father    Hyperlipidemia Father    Diabetes Father    Cancer Sister        ovarian   Heart attack Neg Hx    Sudden death Neg Hx      Current Outpatient Medications:    tiZANidine  (ZANAFLEX ) 4 MG tablet, TAKE 2 TABLETS (8 MG TOTAL) BY MOUTH 2 (TWO) TIMES DAILY AS NEEDED FOR MUSCLE SPASMS., Disp: 60 tablet, Rfl: 0   albuterol  (VENTOLIN  HFA) 108 (90 Base) MCG/ACT inhaler, Inhale 1-2 puffs into the lungs every 6 (six) hours as needed for wheezing or shortness of breath., Disp: 18 g, Rfl: 0   buPROPion  (WELLBUTRIN  SR) 150 MG 12 hr tablet, Take 1 tablet (150 mg total) by mouth daily. (Patient not taking: Reported on 12/08/2023), Disp: 90 tablet, Rfl: 0   diclofenac  (VOLTAREN ) 75 MG EC tablet, Take 1 tablet (75 mg total) by mouth 2 (two) times daily., Disp: 14 tablet, Rfl: 0   HYDROcodone -acetaminophen  (NORCO/VICODIN) 5-325 MG tablet, Take 1 tablet by mouth every 6 (six) hours as needed for moderate pain (pain score  4-6) or severe pain (pain score 7-10). (Patient not taking: Reported on 10/24/2023), Disp: 8 tablet, Rfl: 0   linaclotide (LINZESS) 145 MCG CAPS capsule, , Disp: , Rfl:    methocarbamol  (ROBAXIN ) 750 MG tablet, , Disp: , Rfl:    omeprazole  (PRILOSEC) 20 MG capsule, TAKE 1 CAPSULE BY MOUTH EVERY DAY (Patient not taking: Reported on 10/24/2023), Disp: 30 capsule, Rfl: 0   pantoprazole  (PROTONIX ) 40 MG tablet, Take 1 tablet (40 mg total) by mouth 2 (two) times daily. (Patient not taking: Reported on 12/08/2023), Disp: 60 tablet, Rfl: 0   polyethylene glycol-electrolytes (NULYTELY ) 420 g  solution, , Disp: , Rfl:    senna-docusate (SENOKOT-S) 8.6-50 MG tablet, Take 1 tablet by mouth daily. (Patient not taking: Reported on 12/08/2023), Disp: 30 tablet, Rfl: 1  EXAM:  VITALS per patient if applicable: RR between 12-20 bpm  GENERAL: alert, oriented, appears well and in no acute distress  HEENT: atraumatic, conjunctiva clear, no obvious abnormalities on inspection of external nose and ears  NECK: normal movements of the head and neck  LUNGS: on inspection no signs of respiratory distress, breathing rate appears normal, no obvious gross SOB, gasping or wheezing  CV: no obvious cyanosis  MS: moves all visible extremities without noticeable abnormality  PSYCH/NEURO: pleasant and cooperative, no obvious depression or anxiety, speech and thought processing grossly intact  ASSESSMENT AND PLAN:  Discussed the following assessment and plan:  Avascular necrosis of femur, right (HCC) - Plan: Ambulatory referral to Orthopedic Surgery  Female genital prolapse, unspecified type  Bilateral hip pain - Plan: Ambulatory referral to Orthopedic Surgery, traMADol  (ULTRAM ) 50 MG tablet  Arthritis of spine  Limited supply of tramadol  sent to pharmacy for bilateral hip pain.  Referral to Ortho placed for avascular necrosis of right femur noted on CT stone study.  Patient describing symptoms suggesting pelvic organ prolapse.  Will have patient scheduled in person visit for pelvic to further evaluate.  Discussed various treatment options.  Given precautions.     I discussed the assessment and treatment plan with the patient. The patient was provided an opportunity to ask questions and all were answered. The patient agreed with the plan and demonstrated an understanding of the instructions.   The patient was advised to call back or seek an in-person evaluation if the symptoms worsen or if the condition fails to improve as anticipated.   Lauren Greulich, MD

## 2023-12-09 ENCOUNTER — Ambulatory Visit: Payer: Self-pay | Admitting: Family Medicine

## 2023-12-14 ENCOUNTER — Encounter: Payer: Self-pay | Admitting: Family Medicine

## 2023-12-14 ENCOUNTER — Other Ambulatory Visit (HOSPITAL_COMMUNITY)
Admission: RE | Admit: 2023-12-14 | Discharge: 2023-12-14 | Disposition: A | Discharge status: Home / Self Care | Source: Ambulatory Visit | Attending: Family Medicine | Admitting: Family Medicine

## 2023-12-14 ENCOUNTER — Ambulatory Visit (INDEPENDENT_AMBULATORY_CARE_PROVIDER_SITE_OTHER): Admitting: Family Medicine

## 2023-12-14 VITALS — BP 110/74 | HR 73 | Temp 98.8°F | Ht 65.0 in | Wt 196.4 lb

## 2023-12-14 DIAGNOSIS — Z124 Encounter for screening for malignant neoplasm of cervix: Secondary | ICD-10-CM | POA: Insufficient documentation

## 2023-12-14 DIAGNOSIS — Z113 Encounter for screening for infections with a predominantly sexual mode of transmission: Secondary | ICD-10-CM | POA: Diagnosis not present

## 2023-12-14 DIAGNOSIS — Z90710 Acquired absence of both cervix and uterus: Secondary | ICD-10-CM

## 2023-12-14 DIAGNOSIS — Z1231 Encounter for screening mammogram for malignant neoplasm of breast: Secondary | ICD-10-CM

## 2023-12-14 DIAGNOSIS — N898 Other specified noninflammatory disorders of vagina: Secondary | ICD-10-CM | POA: Insufficient documentation

## 2023-12-14 DIAGNOSIS — Z23 Encounter for immunization: Secondary | ICD-10-CM | POA: Diagnosis not present

## 2023-12-14 DIAGNOSIS — N819 Female genital prolapse, unspecified: Secondary | ICD-10-CM

## 2023-12-14 DIAGNOSIS — Z1211 Encounter for screening for malignant neoplasm of colon: Secondary | ICD-10-CM

## 2023-12-14 MED ORDER — FLUCONAZOLE 150 MG PO TABS: 150.0000 mg | ORAL_TABLET | Freq: Once | ORAL | 0 refills | Status: AC

## 2023-12-14 NOTE — Progress Notes (Signed)
 Established Patient Office Visit   Subjective  Patient ID: Lauren Boyle, female    DOB: 1965-10-05  Age: 58 y.o. MRN: [id number]  Chief Complaint  Patient presents with   Medical Management of Chronic Issues    PAP, back and hip pain     Patient is a 58 year old female seen for follow-up and Pap.  Pt status post hysterectomy for history of fibroids and bleeding.  Patient noticing pelvic fullness, bulge between legs, dyspareunia, increased pressure with urination.  Currently having vaginal irritation.  Unsure if related to using a new his/hers lubricant.  Denies d/c, dysuria.  Pt has an appt with Ortho later this wk for back pain, R hip avascular necrosis.    Patient Active Problem List   Diagnosis Date Noted   Abdominal pain 07/06/2023   Lower urinary tract infectious disease 07/06/2023   Mass of ovary 07/06/2023   Menopausal symptom 07/06/2023   Pelvic mass 07/06/2023   Dyspareunia, female 07/06/2023   Low back pain 07/04/2013   Past Medical History:  Diagnosis Date   Arthritis    Chicken pox    Depression    History of meniscal tear    left   Iron deficiency    Obesity    Osteoarthritis of knee    left   Past Surgical History:  Procedure Laterality Date   ABDOMINAL HYSTERECTOMY     partial   CHONDROPLASTY Left 09/17/2016   Procedure: CHONDROPLASTY;  Surgeon: Marlena Sima, MD;  Location: [facility];  Service: Orthopedics;  Laterality: Left;   COLONOSCOPY     KNEE ARTHROSCOPY WITH MEDIAL MENISECTOMY Left 09/17/2016   Procedure: KNEE ARTHROSCOPY WITH PARTIAL MEDIAL MENISECTOMY;  Surgeon: Marlena Sima, MD;  Location: [facility];  Service: Orthopedics;  Laterality: Left;   ORIF ULNAR FRACTURE Left 04/14/2015   Procedure: OPEN REDUCTION INTERNAL FIXATION (ORIF) ULNAR FRACTURE;  Surgeon: Adah Acron, MD;  Location: [facility] OR;  Service: Orthopedics;  Laterality: Left;   TOTAL KNEE ARTHROPLASTY Left 05/13/2017   Procedure: LEFT TOTAL KNEE  ARTHROPLASTY;  Surgeon: Marlena Sima, MD;  Location: [facility] OR;  Service: Orthopedics;  Laterality: Left;   Social History   Tobacco Use   Smoking status: Former    Current packs/day: 0.25    Types: Cigarettes    Passive exposure: Past   Smokeless tobacco: Never  Vaping Use   Vaping status: Every Day  Substance Use Topics   Alcohol use: Yes    Alcohol/week: 7.0 standard drinks of alcohol    Types: 7 Glasses of wine per week    Comment: Socially   Drug use: No   Family History  Problem Relation Age of Onset   Hyperlipidemia Mother    Hypertension Mother    Hypertension Father    Hyperlipidemia Father    Diabetes Father    Cancer Sister        ovarian   Heart attack Neg Hx    Sudden death Neg Hx    Allergies  Allergen Reactions   Penicillins Itching    Patient was able to tolerate a penicillin shot on 01/22/18 at urgent care.  Patient states she had "severe itching" reaction to shot of Bicillin.  No difficulty breathing, no throat swelling.  Reaction occurred within 30 minutes to an hour.    Other Other (See Comments)    ROS Negative unless stated above    Objective:      BP 110/74 (BP Location: Left Arm, Patient Position: Sitting, Cuff  Size: Normal)   Pulse 73   Temp 98.8 F (37.1 C) (Oral)   Ht 5\' 5"  (1.651 m)   Wt 196 lb 6.4 oz (89.1 kg)   SpO2 96%   BMI 32.68 kg/m  BP Readings from Last 3 Encounters:  12/14/23 110/74  10/20/23 (!) 140/90  08/08/23 (!) 132/94   Wt Readings from Last 3 Encounters:  12/14/23 196 lb 6.4 oz (89.1 kg)  10/20/23 205 lb (93 kg)  08/08/23 188 lb 15 oz (85.7 kg)      Physical Exam Exam conducted with a chaperone present.  Genitourinary:    Pubic Area: No rash.      Labia:        Right: No lesion.        Left: No lesion.      Vagina: Vaginal discharge and prolapsed vaginal walls present.     Uterus: Absent.      Rectum: Normal.     Comments: Whitish d/c present in vaginal vault.  Cervix and uterus surgically  absent.       12/08/2023    4:15 PM 10/24/2023    3:32 PM 08/27/2022    1:33 PM  Depression screen PHQ 2/9  Decreased Interest 0 0 1  Down, Depressed, Hopeless 0 0 0  PHQ - 2 Score 0 0 1  Altered sleeping   1  Tired, decreased energy   1  Change in appetite   2  Feeling bad or failure about yourself    0  Trouble concentrating   0  Moving slowly or fidgety/restless   0  Suicidal thoughts   0  PHQ-9 Score   5  Difficult doing work/chores   Somewhat difficult      12/08/2023    4:15 PM  GAD 7 : Generalized Anxiety Score  Nervous, Anxious, on Edge 0  Control/stop worrying 1  Worry too much - different things 1  Trouble relaxing 1  Restless 1  Easily annoyed or irritable 0  Afraid - awful might happen 0  Total GAD 7 Score 4     No results found for any visits on 12/14/23.    Assessment & Plan:   Female genital prolapse, unspecified type -     Ambulatory referral to Urogynecology  History of hysterectomy -     Ambulatory referral to Urogynecology  Vaginal irritation -     Cytology - PAP -     Ambulatory referral to Urogynecology  Vaginal discharge -     Fluconazole; Take 1 tablet (150 mg total) by mouth once for 1 dose.  Dispense: 1 tablet; Refill: 0  Encounter for screening mammogram for malignant neoplasm of breast -     3D Screening Mammogram, Left and Right; Future  Colon cancer screening -     Ambulatory referral to Gastroenterology  Need for shingles vaccine  Need for Tdap vaccination  Patient with pelvic organ prolapse status post hysterectomy.  Referral to urogyn placed.  Discussed various options to treat symptoms.  Patient does not think she would want to try a pessary, leaning more toward surgical remedy.  Also discussed pelvic floor PT.  Vaginal irritation, discharge noted on exam.  Diflucan sent to pharmacy.  Await results of sample collected during exam.  Referral to GI for colonoscopy placed.  Screen mammogram ordered.  Shingles and Tdap vaccines  given this visit.  Given strict precautions.  Patient encouraged to keep upcoming Ortho appointment this week.  Return if symptoms worsen or fail  to improve.   [name], MD

## 2023-12-14 NOTE — Addendum Note (Signed)
 Addended by: Georga Killings A on: 12/14/2023 11:53 AM   Modules accepted: Orders

## 2023-12-16 ENCOUNTER — Ambulatory Visit: Admitting: Physician Assistant

## 2023-12-20 ENCOUNTER — Ambulatory Visit (INDEPENDENT_AMBULATORY_CARE_PROVIDER_SITE_OTHER): Payer: Self-pay

## 2023-12-20 ENCOUNTER — Other Ambulatory Visit: Payer: Self-pay

## 2023-12-20 ENCOUNTER — Encounter: Payer: Self-pay | Admitting: Sports Medicine

## 2023-12-20 ENCOUNTER — Ambulatory Visit (INDEPENDENT_AMBULATORY_CARE_PROVIDER_SITE_OTHER): Admitting: Physician Assistant

## 2023-12-20 ENCOUNTER — Encounter: Payer: Self-pay | Admitting: Physician Assistant

## 2023-12-20 ENCOUNTER — Ambulatory Visit (INDEPENDENT_AMBULATORY_CARE_PROVIDER_SITE_OTHER): Admitting: Sports Medicine

## 2023-12-20 DIAGNOSIS — M25552 Pain in left hip: Secondary | ICD-10-CM

## 2023-12-20 DIAGNOSIS — M1612 Unilateral primary osteoarthritis, left hip: Secondary | ICD-10-CM

## 2023-12-20 DIAGNOSIS — G8929 Other chronic pain: Secondary | ICD-10-CM

## 2023-12-20 DIAGNOSIS — M160 Bilateral primary osteoarthritis of hip: Secondary | ICD-10-CM

## 2023-12-20 LAB — CYTOLOGY - PAP
Adequacy: ABSENT
Comment: NEGATIVE
Diagnosis: NEGATIVE
Trichomonas: NEGATIVE

## 2023-12-20 MED ORDER — METHYLPREDNISOLONE ACETATE 40 MG/ML IJ SUSP
80.0000 mg | INTRAMUSCULAR | Status: AC | PRN
  Administered 2023-12-20: 80 mg via INTRA_ARTICULAR

## 2023-12-20 MED ORDER — LIDOCAINE HCL 1 % IJ SOLN
4.0000 mL | INTRAMUSCULAR | Status: AC | PRN
  Administered 2023-12-20: 4 mL

## 2023-12-20 NOTE — Progress Notes (Signed)
   Procedure Note  Patient: Lauren Boyle             Date of Birth: 1965/12/11           MRN: [id number]             Visit Date: 12/20/2023  Procedures: Visit Diagnoses:  1. Bilateral primary osteoarthritis of hip   2. Chronic left hip pain    Large Joint Inj: L hip joint on 12/20/2023 11:36 AM Indications: pain Details: 22 G 3.5 in needle, ultrasound-guided anterior approach Medications: 4 mL lidocaine  1 %; 80 mg methylPREDNISolone  acetate 40 MG/ML Outcome: tolerated well, no immediate complications  Procedure: US -guided intra-articular hip injection, Left After discussion on risks/benefits/indications and informed verbal consent was obtained, a timeout was performed. Patient was lying supine on exam table. The hip was cleaned with betadine and alcohol swabs. Then utilizing ultrasound guidance, the patient's femoral head and neck junction was identified and subsequently injected with 4:2 lidocaine :depomedrol via an in-plane approach with ultrasound visualization of the injectate administered into the hip joint. Patient tolerated procedure well without immediate complications.  *Procedure performed with Dr. Francina Irish   Procedure, treatment alternatives, risks and benefits explained, specific risks discussed. Consent was given by the patient. Immediately prior to procedure a time out was called to verify the correct patient, procedure, equipment, support staff and site/side marked as required. Patient was prepped and draped in the usual sterile fashion.     - patient tolerated procedure well, discussed post-injection protocol - has bilateral hip osteoarthritis but left hip is symptomatic - follow-up with Lyndol Santee and/or Dr. Christiane Cowing as indicated; I am happy to see them as needed  Shauna Del, DO Primary Care Sports Medicine Physician  [facility] - Orthopedics  This note was dictated using Dragon naturally speaking software and may contain errors in syntax, spelling, or  content which have not been identified prior to signing this note.

## 2023-12-20 NOTE — Progress Notes (Signed)
 Office Visit Note   Patient: Lauren Boyle           Date of Birth: 1966/01/24           MRN: [id number] Visit Date: 12/20/2023              Requested by: [name], MD [address] [place],  [place] [address] PCP: [name], MD   Assessment & Plan: Visit Diagnoses:  1. Unilateral primary osteoarthritis, left hip     Plan: Impression is left hip osteoarthritis.  Today, we discussed referral to Dr. Vaughn Georges for ultrasound-guided cortisone injection.  She will follow-up with us  as needed.  Call with concerns or questions.  Follow-Up Instructions: Return for f/u with brooks for left hip inj.   Orders:  Orders Placed This Encounter  Procedures   XR HIPS BILAT W OR W/O PELVIS 3-4 VIEWS   No orders of the defined types were placed in this encounter.     Procedures: No procedures performed   Clinical Data: No additional findings.   Subjective: Chief Complaint  Patient presents with   Right Hip - Pain   Left Hip - Pain    HPI patient is a pleasant 58 year old female who comes in today with left hip pain.  Symptoms began about 4 months ago.  She denies any acute injury or change in activity.  All of her pain is to the groin.  Symptoms are aggravated when she goes from a seated to standing position.  She has not tried taking any medication for this pain.  No previous cortisone injection to left hip.  She recently underwent CT scan for possible kidney stones which showed incidental findings of right hip AVN.  She tells me today she has no complaints about the right hip.  Review of Systems as detailed in HPI.  All others reviewed and are negative.   Objective: Vital Signs: There were no vitals taken for this visit.  Physical Exam well-developed well-nourished female in no acute distress.  Alert and oriented x 3.  Ortho Exam left hip exam: Markedly positive logroll, FADIR and Stinchfield testing.  She is neurovascularly intact distally.  Specialty  Comments:  No specialty comments available.  Imaging: XR HIPS BILAT W OR W/O PELVIS 3-4 VIEWS Result Date: 12/20/2023 X-rays demonstrate moderate degenerative changes with periarticular osteophyte formation to the right greater than left hips.  Also noted is evidence of avascular necrosis to the right femoral head.    PMFS History: Patient Active Problem List   Diagnosis Date Noted   Abdominal pain 07/06/2023   Lower urinary tract infectious disease 07/06/2023   Mass of ovary 07/06/2023   Menopausal symptom 07/06/2023   Pelvic mass 07/06/2023   Dyspareunia, female 07/06/2023   Low back pain 07/04/2013   Past Medical History:  Diagnosis Date   Arthritis    Chicken pox    Depression    History of meniscal tear    left   Iron deficiency    Obesity    Osteoarthritis of knee    left    Family History  Problem Relation Age of Onset   Hyperlipidemia Mother    Hypertension Mother    Hypertension Father    Hyperlipidemia Father    Diabetes Father    Cancer Sister        ovarian   Heart attack Neg Hx    Sudden death Neg Hx     Past Surgical History:  Procedure Laterality Date  ABDOMINAL HYSTERECTOMY     partial   CHONDROPLASTY Left 09/17/2016   Procedure: CHONDROPLASTY;  Surgeon: Marlena Sima, MD;  Location: [facility];  Service: Orthopedics;  Laterality: Left;   COLONOSCOPY     KNEE ARTHROSCOPY WITH MEDIAL MENISECTOMY Left 09/17/2016   Procedure: KNEE ARTHROSCOPY WITH PARTIAL MEDIAL MENISECTOMY;  Surgeon: Marlena Sima, MD;  Location: [facility];  Service: Orthopedics;  Laterality: Left;   ORIF ULNAR FRACTURE Left 04/14/2015   Procedure: OPEN REDUCTION INTERNAL FIXATION (ORIF) ULNAR FRACTURE;  Surgeon: Adah Acron, MD;  Location: [facility] OR;  Service: Orthopedics;  Laterality: Left;   TOTAL KNEE ARTHROPLASTY Left 05/13/2017   Procedure: LEFT TOTAL KNEE ARTHROPLASTY;  Surgeon: Marlena Sima, MD;  Location: [facility] OR;  Service: Orthopedics;   Laterality: Left;   Social History   Occupational History   Not on file  Tobacco Use   Smoking status: Former    Current packs/day: 0.25    Types: Cigarettes    Passive exposure: Past   Smokeless tobacco: Never  Vaping Use   Vaping status: Every Day  Substance and Sexual Activity   Alcohol use: Yes    Alcohol/week: 7.0 standard drinks of alcohol    Types: 7 Glasses of wine per week    Comment: Socially   Drug use: No   Sexual activity: Not Currently    Birth control/protection: Surgical

## 2023-12-21 ENCOUNTER — Ambulatory Visit: Payer: Self-pay | Admitting: Family Medicine

## 2024-01-03 ENCOUNTER — Ambulatory Visit: Admitting: Obstetrics and Gynecology

## 2024-01-13 ENCOUNTER — Ambulatory Visit

## 2024-02-06 ENCOUNTER — Encounter: Payer: Self-pay | Admitting: Family Medicine

## 2024-04-18 NOTE — Progress Notes (Deleted)
 New Patient Evaluation and Consultation  Referring Provider: Mercer Clotilda SAUNDERS, MD PCP: Mercer Clotilda SAUNDERS, MD Date of Service: 04/19/2024  SUBJECTIVE Chief Complaint: No chief complaint on file.  History of Present Illness: Lauren Boyle is a 58 y.o. {ED SANE [phone] female seen in consultation at the request of Dr Mercer for evaluation of pelvic organ prolapse, dyspareunia, and pelvic pressure.    S/p hysterectomy for AUB-L Recent vaginal irritation with lubrication use Hematuria *** Negative CT renal stone study 12/07/23  ***Review of records significant for: ***R hip avascular necrosis  EXAMINATION: CT RENAL STONE STUDY   CLINICAL INDICATION: Female, 58 years old. Left sided low back pain, hematuria x months   TECHNIQUE: Helical CT scan examination of the abdomen and pelvis is performed from the domes of the diaphragm to the pubic symphysis. Limited evaluation of the solid organs due to lack of intravenous contrast. Unless otherwise specified, incidental thyroid, adrenal, renal lesions do not require dedicated imaging follow up. Additionally, any mentioned pulmonary nodules do not require dedicated imaging follow-up based on the Fleischner guidelines unless otherwise specified. Coronary calcifications are not identified unless otherwise specified.   COMPARISON: 12/09/2022   FINDINGS:   The lung bases are clear. The heart is normal in size. The liver appears normal. The gallbladder is normal. The spleen is normal. The pancreas is normal. The adrenals are normal. The kidneys are normal. The abdominal aorta is normal in caliber. Scattered calcified atherosclerotic changes are present. The urinary bladder is normal. The uterus is surgically absent. Large and small bowel loops are otherwise within normal limits. There is no free fluid or pathologic lymphadenopathy by size criteria. There are degenerative changes of the spine. There is avascular necrosis in the right  femoral head.   IMPRESSION:   No acute findings in the abdomen or pelvis. No renal calculi or hydronephrosis.   DOSE REDUCTION: This exam was performed according to our departmental dose-optimization program which includes automated exposure control, adjustment of the mA and/or kV according to patient size and/or use of iterative reconstruction technique.   Electronically signed by: [place] Engel MD 12/07/2023 08:51 PM EDT RP Workstation: [id number]  CLINICAL DATA:  Abdominal pain x3 weeks   EXAM: CT ABDOMEN AND PELVIS WITH CONTRAST   TECHNIQUE: Multidetector CT imaging of the abdomen and pelvis was performed using the standard protocol following bolus administration of intravenous contrast.   RADIATION DOSE REDUCTION: This exam was performed according to the departmental dose-optimization program which includes automated exposure control, adjustment of the mA and/or kV according to patient size and/or use of iterative reconstruction technique.   CONTRAST:  OMNIPAQUE  IOHEXOL  300 MG/ML  SOLN   COMPARISON:  01/23/2018   FINDINGS: Lower chest: No acute findings are seen in the lower lung fields.   Hepatobiliary: No focal abnormalities are seen in liver. There is no dilation of bile ducts. Gallbladder is unremarkable.   Pancreas: No focal abnormalities are seen. There is slight prominence of pancreatic duct.   Spleen: Unremarkable.   Adrenals/Urinary Tract: There is 11 mm nodule in right adrenal which has not changed suggesting possible adenoma. There is no hydronephrosis. There are no renal or ureteral stones. Urinary bladder is not distended.   Stomach/Bowel: Stomach is unremarkable. Small bowel loops are not dilated. Appendix is not seen. There is no significant wall thickening in colon. There is no pericolic stranding. Few diverticula are seen in colon without signs of focal diverticulitis.   Vascular/Lymphatic: There are a few scattered calcifications  and  atherosclerotic plaque seen aorta and its major branches. There are scattered subcentimeter nodes with no significant interval change suggesting reactive hyperplasia.   Reproductive: Unremarkable.   Other: There is no ascites or pneumoperitoneum. There is tiny umbilical hernia containing fat is seen. No significant imaging abnormality is seen in the inguinal regions.   Musculoskeletal: There is mild anterolisthesis at L4-L5 level. Degenerative changes are noted in the facet joints and lower lumbar spine. Degenerative changes are noted in both hips. There is a 1.5 cm sclerotic density in the head of the right femur which has increased in size since 01/23/2018. The sclerotic density measured 5 mm in the previous study. There is no break in the cortical margins.   IMPRESSION: There is no evidence of intestinal obstruction or pneumoperitoneum. There is no hydronephrosis.   Few diverticula seen in the colon without signs of focal diverticulitis. Lumbar spondylosis. Arteriosclerosis. Possible 11 mm right adrenal adenoma.   There is 1.5 cm sclerotic density in the head of the right femur, possibly suggesting benign bone island. There is interval increase in size of the sclerotic density. If there are persistent symptoms in the right hip region, follow-up CT or bone scan may be considered.     Electronically Signed   By: Gearldine Mary M.D.   On: 12/09/2022 15:41  Urinary Symptoms: {urine leakage?:24754} Leaks *** time(s) per {days/wks/mos/yrs:310907}.  Pad use: {NUMBERS 1-10:18281} {pad option:24752} per day.   Patient {ACTION; IS/IS WNU:78978602} bothered by UI symptoms.  Day time voids ***.  Nocturia: *** times per night to void. Voiding dysfunction:  {empties:24755} bladder well.  Patient {DOES NOT does:27190::does not} use a catheter to empty bladder.  When urinating, patient feels {urine symptoms:24756} Drinks: *** per day  UTIs: {NUMBERS 1-10:18281} UTI's  in the last year.   {ACTIONS;DENIES/REPORTS:21021675::Denies} history of {urologic concerns:24757} No results found for the last 90 days.   Pelvic Organ Prolapse Symptoms:                  Patient {denies/ admits to:24761} a feeling of a bulge the vaginal area. It has been present for {NUMBER 1-10:22536} {days/wks/mos/yrs:310907}.  Patient {denies/ admits to:24761} seeing a bulge.  This bulge {ACTION; IS/IS WNU:78978602} bothersome.  Bowel Symptom: Bowel movements: *** time(s) per {Time; day/week/month:13537} Stool consistency: {stool consistency:24758} Straining: {yes/no:19897}.  Splinting: {yes/no:19897}.  Incomplete evacuation: {yes/no:19897}.  Patient {denies/ admits to:24761} accidental bowel leakage / fecal incontinence  Occurs: *** time(s) per {Time; day/week/month:13537}  Consistency with leakage: {stool consistency:24758} Bowel regimen: {bowel regimen:24759} Last colonoscopy: Date ***, Results *** [facility]          Current Care Gaps     Colonoscopy (Every 10 Years) Never done   No completion history exists for this topic.                 Sexual Function Sexually active: {yes/no:19897}.  Sexual orientation: {Sexual Orientation:[phone]} Pain with sex: {pain with sex:24762}  Pelvic Pain {denies/ admits to:24761} pelvic pain Location: *** Pain occurs: *** Prior pain treatment: *** Improved by: *** Worsened by: ***   Past Medical History:  Past Medical History:  Diagnosis Date   Arthritis    Chicken pox    Depression    History of meniscal tear    left   Iron deficiency    Obesity    Osteoarthritis of knee    left     Past Surgical History:   Past Surgical History:  Procedure Laterality Date   ABDOMINAL HYSTERECTOMY  partial   CHONDROPLASTY Left 09/17/2016   Procedure: CHONDROPLASTY;  Surgeon: Toribio Silos, MD;  Location: [facility];  Service: Orthopedics;  Laterality: Left;   COLONOSCOPY     KNEE  ARTHROSCOPY WITH MEDIAL MENISECTOMY Left 09/17/2016   Procedure: KNEE ARTHROSCOPY WITH PARTIAL MEDIAL MENISECTOMY;  Surgeon: Toribio Silos, MD;  Location: [facility];  Service: Orthopedics;  Laterality: Left;   ORIF ULNAR FRACTURE Left 04/14/2015   Procedure: OPEN REDUCTION INTERNAL FIXATION (ORIF) ULNAR FRACTURE;  Surgeon: Oneil JAYSON Herald, MD;  Location: [facility] OR;  Service: Orthopedics;  Laterality: Left;   TOTAL KNEE ARTHROPLASTY Left 05/13/2017   Procedure: LEFT TOTAL KNEE ARTHROPLASTY;  Surgeon: Silos Toribio, MD;  Location: [facility];  Service: Orthopedics;  Laterality: Left;     Past OB/GYN History: OB History  No obstetric history on file.    Vaginal deliveries: ***,  Forceps/ Vacuum deliveries: ***, Cesarean section: *** Menopausal: {menopausal:24763} Contraception: ***. Last pap smear was ***.  Any history of abnormal pap smears: {yes/no:19897}.    Component Value Date/Time   DIAGPAP  12/14/2023 1133    - Negative for intraepithelial lesion or malignancy (NILM)   ADEQPAP  12/14/2023 1133    Satisfactory for evaluation; transformation zone component ABSENT.    Medications: Patient has a current medication list which includes the following prescription(s): tizanidine , [DISCONTINUED] pylera , and [DISCONTINUED] fluticasone .   Allergies: Patient is allergic to penicillins and other.   Social History:  Social History   Tobacco Use   Smoking status: Former    Current packs/day: 0.25    Types: Cigarettes    Passive exposure: Past   Smokeless tobacco: Never  Vaping Use   Vaping status: Every Day  Substance Use Topics   Alcohol use: Yes    Alcohol/week: 7.0 standard drinks of alcohol    Types: 7 Glasses of wine per week    Comment: Socially   Drug use: No    Relationship status: {relationship status:24764} Patient lives with ***.   Patient {ACTION; IS/IS WNU:78978602} employed ***. Regular exercise: {Yes/No:304960894} History of abuse:  {Yes/No:304960894}  Family History:   Family History  Problem Relation Age of Onset   Hyperlipidemia Mother    Hypertension Mother    Hypertension Father    Hyperlipidemia Father    Diabetes Father    Cancer Sister        ovarian   Heart attack Neg Hx    Sudden death Neg Hx      Review of Systems: ROS   OBJECTIVE Physical Exam: There were no vitals filed for this visit.  Physical Exam   GU / Detailed Urogynecologic Evaluation:  Pelvic Exam: Normal external female genitalia; Bartholin's and Skene's glands normal in appearance; urethral meatus normal in appearance, no urethral masses or discharge.   CST: {gen negative/positive:315881}  Reflexes: bulbocavernosis {DESC; PRESENT/NOT PRESENT:21021351}, anocutaneous {DESC; PRESENT/NOT PRESENT:21021351} ***bilaterally.  Speculum exam reveals normal vaginal mucosa {With/Without:20273} atrophy. Cervix {exam; gyn cervix:30847}. Uterus {exam; pelvic uterus:30849}. Adnexa {exam; adnexa:12223}.    s/p hysterectomy: Speculum exam reveals normal vaginal mucosa {With/Without:20273}  atrophy and normal vaginal cuff.  Adnexa {exam; adnexa:12223}.    With apex supported, anterior compartment defect was {reduced:24765}  Pelvic floor strength {Roman # I-V:19040}/V, puborectalis {Roman # I-V:19040}/V external anal sphincter {Roman # I-V:19040}/V  Pelvic floor musculature: Right levator {Tender/Non-tender:20250}, Right obturator {Tender/Non-tender:20250}, Left levator {Tender/Non-tender:20250}, Left obturator {Tender/Non-tender:20250}  POP-Q:   POP-Q  Aa                                               Ba                                                 C                                                Gh                                               Pb                                               tvl                                                Ap                                                Bp                                                 D      Rectal Exam:  Normal sphincter tone, {rectocele:24766} distal rectocele, enterocoele {DESC; PRESENT/NOT PRESENT:21021351}, no rectal masses, {sign of:24767} dyssynergia when asking the patient to bear down.  Post-Void Residual (PVR) by Bladder Scan: In order to evaluate bladder emptying, we discussed obtaining a postvoid residual and patient agreed to this procedure.  Procedure: The ultrasound unit was placed on the patient's abdomen in the suprapubic region after the patient had voided.      Laboratory Results: Lab Results  Component Value Date   COLORU yellow 10/20/2023   CLARITYU cloudy 10/20/2023   GLUCOSEUR Negative 10/20/2023   BILIRUBINUR negative 10/20/2023   KETONESU negative 10/20/2023   SPECGRAV 1.025 10/20/2023   RBCUR Positive 10/20/2023   PHUR 6.0 10/20/2023   PROTEINUR Positive (A) 10/20/2023   UROBILINOGEN 0.2 10/20/2023   LEUKOCYTESUR Large (3+) (A) 10/20/2023    Lab Results  Component Value Date   CREATININE 0.73 10/20/2023   CREATININE 0.73 12/09/2022   CREATININE 0.97 08/25/2022    No results found for: HGBA1C  Lab Results  Component Value Date   HGB 12.8 10/20/2023     ASSESSMENT AND PLAN Ms. Alia is a 58 y.o. with: No diagnosis found.  There are no diagnoses linked to this  encounter.   Lianne ONEIDA Gillis, MD

## 2024-04-19 ENCOUNTER — Ambulatory Visit: Admitting: Obstetrics

## 2024-04-19 DIAGNOSIS — N941 Unspecified dyspareunia: Secondary | ICD-10-CM

## 2024-05-28 ENCOUNTER — Encounter: Payer: Self-pay | Admitting: Radiology

## 2024-07-21 ENCOUNTER — Other Ambulatory Visit: Payer: Self-pay

## 2024-07-21 ENCOUNTER — Emergency Department (HOSPITAL_BASED_OUTPATIENT_CLINIC_OR_DEPARTMENT_OTHER)
Admission: EM | Admit: 2024-07-21 | Discharge: 2024-07-21 | Disposition: A | Discharge status: Home / Self Care | Attending: Emergency Medicine | Admitting: Emergency Medicine

## 2024-07-21 ENCOUNTER — Encounter (HOSPITAL_BASED_OUTPATIENT_CLINIC_OR_DEPARTMENT_OTHER): Payer: Self-pay

## 2024-07-21 ENCOUNTER — Emergency Department (HOSPITAL_BASED_OUTPATIENT_CLINIC_OR_DEPARTMENT_OTHER)

## 2024-07-21 DIAGNOSIS — R059 Cough, unspecified: Secondary | ICD-10-CM | POA: Diagnosis present

## 2024-07-21 DIAGNOSIS — Z87891 Personal history of nicotine dependence: Secondary | ICD-10-CM | POA: Insufficient documentation

## 2024-07-21 DIAGNOSIS — J069 Acute upper respiratory infection, unspecified: Secondary | ICD-10-CM | POA: Insufficient documentation

## 2024-07-21 MED ORDER — ALBUTEROL SULFATE HFA 108 (90 BASE) MCG/ACT IN AERS
2.0000 | INHALATION_SPRAY | Freq: Once | RESPIRATORY_TRACT | Status: AC
  Administered 2024-07-21: 2 via RESPIRATORY_TRACT
  Filled 2024-07-21: qty 6.7

## 2024-07-21 MED ORDER — METHYLPREDNISOLONE 4 MG PO TBPK: ORAL_TABLET | ORAL | 0 refills | Status: AC

## 2024-07-21 MED ORDER — IPRATROPIUM-ALBUTEROL 0.5-2.5 (3) MG/3ML IN SOLN
3.0000 mL | Freq: Once | RESPIRATORY_TRACT | Status: AC
  Administered 2024-07-21: 3 mL via RESPIRATORY_TRACT
  Filled 2024-07-21: qty 3

## 2024-07-21 MED ORDER — PREDNISONE 50 MG PO TABS
60.0000 mg | ORAL_TABLET | Freq: Once | ORAL | Status: AC
  Administered 2024-07-21: 60 mg via ORAL
  Filled 2024-07-21: qty 1

## 2024-07-21 NOTE — ED Triage Notes (Signed)
 Reports +flu A home test 2 weeks ago. Reports continued cough, congestion, body aches

## 2024-07-21 NOTE — ED Provider Notes (Signed)
 " [facility] Provider Note   CSN: [id number] Arrival date & time: 07/21/24  9278     Patient presents with: Cough   Lauren Boyle is a 58 y.o. female with history of smoking presented to ED with persistent coughing and loss of voice.  Patient reports that she tested positive at home with a home flu test about 9 days ago.  She says she had bodyaches and fevers, and some of these have improved, but she continues to have a persistent cough and has lost her voice.  She denies known history of asthma or COPD but does vape.  Her husband who is also present in the ED has had about a week of cough and congestion.   HPI     Prior to Admission medications  Medication Sig Start Date End Date Taking? Authorizing Provider  methylPREDNISolone  (MEDROL  DOSEPAK) 4 MG TBPK tablet Use as directed on package 07/22/24  Yes Maebell Lyvers, Donnice PARAS, MD  tiZANidine  (ZANAFLEX ) 4 MG tablet TAKE 2 TABLETS (8 MG TOTAL) BY MOUTH 2 (TWO) TIMES DAILY AS NEEDED FOR MUSCLE SPASMS. 03/31/23   Mercer Clotilda SAUNDERS, MD  bismuth -metronidazole -tetracycline (PYLERA ) 140-125-125 MG capsule Take 3 capsules by mouth 4 (four) times daily -  before meals and at bedtime for 10 days. 08/13/19 08/20/20  Teressa Toribio SQUIBB, MD  fluticasone  (FLONASE ) 50 MCG/ACT nasal spray PLACE 1 SPRAY INTO BOTH NOSTRILS DAILY. 08/08/19 08/20/20  Mercer Clotilda SAUNDERS, MD    Allergies: Penicillins and Other    Review of Systems  Updated Vital Signs BP 127/77 (BP Location: Right Wrist)   Pulse 64   Temp 97.7 F (36.5 C)   Resp 18   Ht 5' 5 (1.651 m)   Wt 87.1 kg   SpO2 98%   BMI 31.95 kg/m   Physical Exam Constitutional:      General: She is not in acute distress. HENT:     Head: Normocephalic and atraumatic.  Eyes:     Conjunctiva/sclera: Conjunctivae normal.     Pupils: Pupils are equal, round, and reactive to light.  Cardiovascular:     Rate and Rhythm: Normal rate and regular rhythm.  Pulmonary:      Effort: Pulmonary effort is normal. No respiratory distress.     Comments: Mild end expiratory wheezing, rhonchi of the right lower lobe, speaking full sentences with no retractions or accessory muscle usage Abdominal:     General: There is no distension.     Tenderness: There is no abdominal tenderness.  Skin:    General: Skin is warm and dry.  Neurological:     General: No focal deficit present.     Mental Status: She is alert. Mental status is at baseline.  Psychiatric:        Mood and Affect: Mood normal.        Behavior: Behavior normal.     (all labs ordered are listed, but only abnormal results are displayed) Labs Reviewed - No data to display  EKG: None  Radiology: DG Chest Portable 1 View Result Date: 07/21/2024 CLINICAL DATA:  Upper respiratory infection. EXAM: PORTABLE CHEST 1 VIEW COMPARISON:  None Available. FINDINGS: Lordotic low volume film. The lungs are clear without focal pneumonia, edema, pneumothorax or pleural effusion. Cardiopericardial silhouette is at upper limits of normal for size. No acute bony abnormality. IMPRESSION: No active disease. Electronically Signed   By: Camellia Candle M.D.   On: 07/21/2024 09:22     Procedures  Medications Ordered in the ED  ipratropium-albuterol  (DUONEB) 0.5-2.5 (3) MG/3ML nebulizer solution 3 mL (3 mLs Nebulization Given 07/21/24 0803)  albuterol  (VENTOLIN  HFA) 108 (90 Base) MCG/ACT inhaler 2 puff (2 puffs Inhalation Given 07/21/24 0816)  predniSONE  (DELTASONE ) tablet 60 mg (60 mg Oral Given 07/21/24 9171)                                    Medical Decision Making Amount and/or Complexity of Data Reviewed Radiology: ordered.  Risk Prescription drug management.   Patient is here with persistent coughing now for 9days.  Influenza test positive about 9 days ago.  She should no longer be contagious from a flu perspective, however she may have a component of bronchitis or superimposed bacterial infection. I have  ordered a chest x-ray.  She is also ordered prednisone  and a nebulizer treatment for possible reactive airway component, given her smoking history.  She is not requiring blood work.  Doubt acute PE or other life-threatening infection.  I personally reviewed and interpreted the patient's workup including x-ray, notable for no emergent findings  On reassessment the patient was feeling better after albuterol  but jittery from this medicine.  We discussed and counseled on smoking cessation.  Will prescribe her a Medrol  Dosepak for the next few days to help with some reactive airway component or bronchitis.  There is no indication for antibiotics at this time.  I doubt this is a bacterial infection.  I was able to answer all of her questions.     Final diagnoses:  Viral URI with cough    ED Discharge Orders          Ordered    methylPREDNISolone  (MEDROL  DOSEPAK) 4 MG TBPK tablet        07/21/24 0842               Cottie Donnice PARAS, MD 07/21/24 [phone]  "

## 2024-07-21 NOTE — Discharge Instructions (Signed)
 Based on your positive flu test 8 days ago, you should no longer be contagious for influenza or the flu.  You may be experiencing bronchitis, or inflammation of your airway, as a result of the flu.  I prescribed you prednisone  and a steroid for this issue.  You will continue taking the prednisone  tomorrow morning with breakfast.  We also gave you an albuterol  inhaler.  You can take 2 puffs every 4 hours as needed for wheezing and chest tightness.  Please make an effort to quit smoking and vaping.  Doing this can worsen your risk of bronchitis, asthma, lung cancer, and other serious lung disease.
